# Patient Record
Sex: Male | Born: 1940
Health system: Southern US, Community
[De-identification: ages and names within clinical notes are randomized; demographics above are authoritative.]

## PROBLEM LIST (undated history)

## (undated) DIAGNOSIS — I10 Essential (primary) hypertension: Secondary | ICD-10-CM

## (undated) DIAGNOSIS — M21371 Foot drop, right foot: Secondary | ICD-10-CM

## (undated) DIAGNOSIS — I219 Acute myocardial infarction, unspecified: Secondary | ICD-10-CM

## (undated) DIAGNOSIS — Z8711 Personal history of peptic ulcer disease: Secondary | ICD-10-CM

## (undated) DIAGNOSIS — R001 Bradycardia, unspecified: Secondary | ICD-10-CM

## (undated) DIAGNOSIS — E782 Mixed hyperlipidemia: Secondary | ICD-10-CM

## (undated) DIAGNOSIS — Z8679 Personal history of other diseases of the circulatory system: Secondary | ICD-10-CM

## (undated) DIAGNOSIS — I2510 Atherosclerotic heart disease of native coronary artery without angina pectoris: Secondary | ICD-10-CM

## (undated) DIAGNOSIS — Z8719 Personal history of other diseases of the digestive system: Secondary | ICD-10-CM

## (undated) DIAGNOSIS — G709 Myoneural disorder, unspecified: Secondary | ICD-10-CM

## (undated) HISTORY — DX: Personal history of other diseases of the digestive system: Z87.19

## (undated) HISTORY — DX: Mixed hyperlipidemia: E78.2

## (undated) HISTORY — DX: Atherosclerotic heart disease of native coronary artery without angina pectoris: I25.10

## (undated) HISTORY — DX: Essential (primary) hypertension: I10

## (undated) HISTORY — DX: Personal history of peptic ulcer disease: Z87.11

## (undated) HISTORY — DX: Acute myocardial infarction, unspecified: I21.9

## (undated) HISTORY — DX: Bradycardia, unspecified: R00.1

## (undated) HISTORY — PX: CORONARY STENT PLACEMENT: SHX1402

## (undated) HISTORY — DX: Personal history of other diseases of the circulatory system: Z86.79

---

## 2002-05-09 DIAGNOSIS — I219 Acute myocardial infarction, unspecified: Secondary | ICD-10-CM

## 2002-05-09 HISTORY — DX: Acute myocardial infarction, unspecified: I21.9

## 2005-10-28 ENCOUNTER — Inpatient Hospital Stay (HOSPITAL_COMMUNITY): Admission: EM | Admit: 2005-10-28 | Discharge: 2005-10-30 | Payer: Self-pay | Admitting: Emergency Medicine

## 2005-10-28 ENCOUNTER — Ambulatory Visit: Payer: Self-pay | Admitting: Cardiology

## 2005-11-16 ENCOUNTER — Ambulatory Visit: Payer: Self-pay | Admitting: Cardiology

## 2005-12-14 ENCOUNTER — Ambulatory Visit: Payer: Self-pay | Admitting: Cardiology

## 2006-01-02 ENCOUNTER — Ambulatory Visit: Payer: Self-pay | Admitting: Cardiology

## 2006-02-08 ENCOUNTER — Ambulatory Visit: Payer: Self-pay | Admitting: Cardiology

## 2006-04-04 ENCOUNTER — Ambulatory Visit: Payer: Self-pay | Admitting: Cardiology

## 2006-04-21 ENCOUNTER — Ambulatory Visit: Payer: Self-pay | Admitting: Cardiology

## 2006-05-17 ENCOUNTER — Ambulatory Visit: Payer: Self-pay

## 2006-05-17 LAB — CONVERTED CEMR LAB
Albumin: 4.1 g/dL (ref 3.5–5.2)
Bilirubin, Direct: 0.2 mg/dL (ref 0.0–0.3)
CO2: 33 meq/L — ABNORMAL HIGH (ref 19–32)
Chloride: 102 meq/L (ref 96–112)
Creatinine, Ser: 1.3 mg/dL (ref 0.4–1.5)
Glucose, Bld: 89 mg/dL (ref 70–99)
HDL: 50 mg/dL (ref >39.0)
Total Bilirubin: 1.1 mg/dL (ref 0.3–1.2)
Total Protein: 7.1 g/dL (ref 6.0–8.3)
VLDL: 47 mg/dL — ABNORMAL HIGH (ref 0–40)

## 2006-05-18 ENCOUNTER — Encounter: Payer: Self-pay | Admitting: Cardiology

## 2006-05-18 LAB — CONVERTED CEMR LAB: Glucose, Bld: 93 mg/dL (ref 70–99)

## 2006-11-02 ENCOUNTER — Ambulatory Visit: Payer: Self-pay | Admitting: Cardiology

## 2006-11-02 LAB — CONVERTED CEMR LAB
AST: 20 units/L (ref 0–37)
Albumin: 4.1 g/dL (ref 3.5–5.2)
Alkaline Phosphatase: 101 units/L (ref 39–117)
Cholesterol: 222 mg/dL (ref 0–200)
HDL: 46.5 mg/dL (ref >39.0)
Total Bilirubin: 1.4 mg/dL — ABNORMAL HIGH (ref 0.3–1.2)
Total CHOL/HDL Ratio: 4.8
VLDL: 40 mg/dL (ref 0–40)

## 2007-05-14 ENCOUNTER — Ambulatory Visit: Payer: Self-pay | Admitting: Cardiology

## 2007-10-22 ENCOUNTER — Ambulatory Visit: Payer: Self-pay | Admitting: Cardiology

## 2007-10-22 LAB — CONVERTED CEMR LAB
ALT: 15 units/L (ref 0–53)
Albumin: 4 g/dL (ref 3.5–5.2)
Alkaline Phosphatase: 93 units/L (ref 39–117)
Direct LDL: 227.7 mg/dL
HDL: 37.6 mg/dL — ABNORMAL LOW (ref >39.0)
Total Bilirubin: 1.1 mg/dL (ref 0.3–1.2)
Total CHOL/HDL Ratio: 8.4
Total Protein: 7.1 g/dL (ref 6.0–8.3)

## 2007-11-19 ENCOUNTER — Ambulatory Visit: Payer: Self-pay | Admitting: Cardiology

## 2008-01-28 ENCOUNTER — Ambulatory Visit: Payer: Self-pay | Admitting: Cardiology

## 2008-01-28 LAB — CONVERTED CEMR LAB
AST: 19 units/L (ref 0–37)
Bilirubin, Direct: 0.1 mg/dL (ref 0.0–0.3)
Direct LDL: 142.8 mg/dL
Total Bilirubin: 1.1 mg/dL (ref 0.3–1.2)
Total CHOL/HDL Ratio: 4.9
Total Protein: 7.1 g/dL (ref 6.0–8.3)
Triglycerides: 157 mg/dL — ABNORMAL HIGH (ref 0–149)
VLDL: 31 mg/dL (ref 0–40)

## 2008-05-14 ENCOUNTER — Ambulatory Visit: Payer: Self-pay | Admitting: Cardiology

## 2008-05-20 ENCOUNTER — Ambulatory Visit: Payer: Self-pay | Admitting: Cardiology

## 2008-11-28 ENCOUNTER — Encounter (INDEPENDENT_AMBULATORY_CARE_PROVIDER_SITE_OTHER): Payer: Self-pay | Admitting: *Deleted

## 2009-01-03 DIAGNOSIS — E785 Hyperlipidemia, unspecified: Secondary | ICD-10-CM | POA: Insufficient documentation

## 2009-01-03 DIAGNOSIS — Z8711 Personal history of peptic ulcer disease: Secondary | ICD-10-CM | POA: Insufficient documentation

## 2009-01-03 DIAGNOSIS — I10 Essential (primary) hypertension: Secondary | ICD-10-CM | POA: Insufficient documentation

## 2009-01-03 HISTORY — DX: Personal history of peptic ulcer disease: Z87.11

## 2009-01-07 ENCOUNTER — Ambulatory Visit: Payer: Self-pay | Admitting: Cardiology

## 2009-01-07 DIAGNOSIS — I498 Other specified cardiac arrhythmias: Secondary | ICD-10-CM | POA: Insufficient documentation

## 2009-01-08 ENCOUNTER — Encounter: Payer: Self-pay | Admitting: Cardiology

## 2009-07-07 ENCOUNTER — Ambulatory Visit: Payer: Self-pay | Admitting: Cardiology

## 2009-07-27 ENCOUNTER — Ambulatory Visit (HOSPITAL_COMMUNITY): Admission: RE | Admit: 2009-07-27 | Discharge: 2009-07-27 | Payer: Self-pay | Admitting: Family Medicine

## 2009-07-27 ENCOUNTER — Encounter (HOSPITAL_COMMUNITY): Admission: RE | Admit: 2009-07-27 | Discharge: 2009-08-26 | Payer: Self-pay | Admitting: Neurology

## 2009-08-27 ENCOUNTER — Encounter (HOSPITAL_COMMUNITY): Admission: RE | Admit: 2009-08-27 | Discharge: 2009-09-26 | Payer: Self-pay | Admitting: Neurology

## 2009-12-03 ENCOUNTER — Telehealth: Payer: Self-pay | Admitting: Cardiology

## 2009-12-07 ENCOUNTER — Encounter: Payer: Self-pay | Admitting: Cardiology

## 2009-12-14 DIAGNOSIS — I2510 Atherosclerotic heart disease of native coronary artery without angina pectoris: Secondary | ICD-10-CM | POA: Insufficient documentation

## 2009-12-15 ENCOUNTER — Ambulatory Visit: Payer: Self-pay | Admitting: Cardiology

## 2009-12-25 ENCOUNTER — Ambulatory Visit: Payer: Self-pay | Admitting: Cardiology

## 2009-12-25 ENCOUNTER — Encounter: Payer: Self-pay | Admitting: Internal Medicine

## 2009-12-31 ENCOUNTER — Ambulatory Visit: Payer: Self-pay | Admitting: Cardiology

## 2010-06-08 NOTE — Procedures (Signed)
Summary: Holter and Event  Holter and Event   Imported By: Faythe Ghee 12/31/2009 16:09:05  _____________________________________________________________________  External Attachment:    Type:   Image     Comment:   External Document

## 2010-06-08 NOTE — Assessment & Plan Note (Signed)
Summary: [place]      Allergies Added:   Visit Type:  Follow-up Primary [name]:  [name]  CC:  no cardiology complaints.  History of Present Illness: [name] comes in today for followup of his symptomatic bradycardia.  We discontinued his low-dose metoprolol at 25 mg a day. He felt much better with no further dizziness. Holter monitor demonstrated a mean heart rate of 62 beats per minute. Max was 96 beats per minute and his minimal was 46 beats per minute. He had rare ventricular ectopy with one couplet and rare supraventricular aches as well. He has a history of relatively good LV systolic function.  He denies a palpitations or rapid heartbeat. He's had no angina or ischemic symptoms. His blood pressure remains under good control.  Current Medications (verified): 1)  Aspirin 81 Mg Tbec (Aspirin) .... Take One Tablet By Mouth Daily 2)  Simvastatin 80 Mg Tabs (Simvastatin) .[name].. 1 Tab At Bedtime 3)  Enalapril Maleate 10 Mg Tabs (Enalapril Maleate) .[name].. 1 Tab Once Daily 4)  Omeprazole 20 Mg Tbec (Omeprazole) .[name].. 1 Tab Once Daily 5)  Multivitamins   Tabs (Multiple Vitamin) .[name].. 1 Tab Once Daily 6)  Nitroglycerin 0.4 Mg Subl (Nitroglycerin) .... One Tablet Under Tongue Every 5 Minutes As Needed For Chest Pain---May Repeat Times Three  Allergies (verified): 1)  ! Ibuprofen  Past History:  Past Medical History: Last updated: 12/14/2009 CAD, NATIVE VESSEL (ICD-414.01) BRADYCARDIA (ICD-427.89) MI/ 2004 (ICD-410.90) MITRAL VALVE PROLAPSE, HX OF/ MILD (ICD-V12.50) HYPERLIPIDEMIA-MIXED (ICD-272.4) HYPERTENSION, UNSPECIFIED (ICD-401.9) GI BLEEDING/ HX OF 2004 (ICD-578.9) PUD, HX OF (ICD-V12.71)    Past Surgical History: Last updated: 01/03/2009 5 stents in 2004 1 stents in 2007  Family History: Last updated: 01/03/2009 Family History of Coronary Artery Disease: Mother and father both deceased due to MI Siblings: 1 Sister and 1  Brother  Social History: Last updated: 01/03/2009 Retired ..2006 Disabled  Married  Tobacco Use - Former. quit 1978 Alcohol Use - no Regular Exercise - yes Drug Use - no  Risk Factors: Exercise: yes (01/03/2009)  Risk Factors: Smoking Status: quit (01/03/2009)  Review of Systems       negative other than history of present illness  Vital Signs:  Patient profile:   70 year old male Weight:      198 pounds BMI:     25.51 Pulse rate:   87 / minute BP sitting:   111 / 67  (right arm)  Vitals Entered By: Dreama Saa, CNA (December 31, 2009 11:23 AM)  Physical Exam  General:  Well developed, well nourished, in no acute distress. Head:  normocephalic and atraumatic Eyes:  PERRLA/EOM intact; conjunctiva and lids normal. Neck:  Neck supple, no JVD. No masses, thyromegaly or abnormal cervical nodes. Chest Wall:  no deformities or breast masses noted Lungs:  Clear bilaterally to auscultation and percussion. Heart:  PMI not displaced, normal S1-S2, regular rate and rhythm, carotid strokes are equal bilaterally without bruit Msk:  Back normal, normal gait. Muscle strength and tone normal. Pulses:  pulses normal in all 4 extremities Extremities:  No clubbing or cyanosis. Neurologic:  Alert and oriented x 3. Skin:  Intact without lesions or rashes. Psych:  Normal affect.   Impression & Recommendations:  Problem # 1:  CAD, NATIVE VESSEL (ICD-414.01) Assessment Unchanged  His updated medication list for this problem includes:    Aspirin 81 Mg Tbec (Aspirin) .[name]... Take one tablet by mouth daily    Enalapril Maleate 10 Mg Tabs (  Enalapril maleate) .[name]... 1 tab once daily    Nitroglycerin 0.4 Mg Subl (Nitroglycerin) ..... One tablet under tongue every 5 minutes as needed for chest pain---may repeat times three  Problem # 2:  BRADYCARDIA (ICD-427.89) Assessment: Improved his resting heart rate is now in the 70s. Holter monitor showed minimal heart rate of 40 60 in the morning  during sleep. He has minimal ectopy. He has normal left ventricular function. At the present time there is no indication for pacemaker and will self a low dose beta blocker. Results reviewed at length and all questions answered. I will see him back again in 6 months. His updated medication list for this problem includes:    Aspirin 81 Mg Tbec (Aspirin) .[name]... Take one tablet by mouth daily    Enalapril Maleate 10 Mg Tabs (Enalapril maleate) .[name]... 1 tab once daily    Nitroglycerin 0.4 Mg Subl (Nitroglycerin) ..... One tablet under tongue every 5 minutes as needed for chest pain---may repeat times three  Problem # 3:  MI/ 2004 (ICD-410.90) Assessment: Unchanged  His updated medication list for this problem includes:    Aspirin 81 Mg Tbec (Aspirin) .[name]... Take one tablet by mouth daily    Enalapril Maleate 10 Mg Tabs (Enalapril maleate) .[name]... 1 tab once daily    Nitroglycerin 0.4 Mg Subl (Nitroglycerin) ..... One tablet under tongue every 5 minutes as needed for chest pain---may repeat times three  Problem # 4:  MITRAL VALVE PROLAPSE, HX OF/ MILD (ICD-V12.50) Assessment: Unchanged  Problem # 5:  HYPERLIPIDEMIA-MIXED (ICD-272.4)  His updated medication list for this problem includes:    Simvastatin 80 Mg Tabs (Simvastatin) .[name]... 1 tab at bedtime  Problem # 6:  HYPERTENSION, UNSPECIFIED (ICD-401.9) Assessment: Unchanged  His updated medication list for this problem includes:    Aspirin 81 Mg Tbec (Aspirin) .[name]... Take one tablet by mouth daily    Enalapril Maleate 10 Mg Tabs (Enalapril maleate) .[name]... 1 tab once daily  Patient Instructions: 1)  Your physician recommends that you schedule a follow-up appointment in: 6 months 2)  Your physician recommends that you continue on your current medications as directed. Please refer to the Current Medication list given to you today.

## 2010-06-08 NOTE — Assessment & Plan Note (Signed)
Summary: 6 mo f/u cy  Medications Added SIMVASTATIN 80 MG TABS (SIMVASTATIN) 1 tab at bedtime METOPROLOL SUCCINATE 25 MG XR24H-TAB (METOPROLOL SUCCINATE) 1 tab once daily ENALAPRIL MALEATE 10 MG TABS (ENALAPRIL MALEATE) 1 tab once daily NITROGLYCERIN 0.4 MG SUBL (NITROGLYCERIN) One tablet under tongue every 5 minutes as needed for chest pain---may repeat times three        Visit Type:  6 mo f/u Primary Provider:  no PCP  CC:  sob...edema/right foot.Marland Kitchendenies any cp...  History of Present Illness: [name] returns today for evaluation and management his coronary artery disease and mixed hyperlipidemia.  He is no complaints of angina or ischemic symptoms. He's having no problems with his medications and seems to be compliant.  As orthopnea, PND peripheral edema palpitations or syncope.  Recent lipids show total cholesterol 174, HDL 44, LDL 107, VLDL 23, and triglycerides normal at 114. LFTs were normal I reviewed these with patient and his wife today.  Current Medications (verified): 1)  Aspirin 81 Mg Tbec (Aspirin) .... Take One Tablet By Mouth Daily 2)  Simvastatin 80 Mg Tabs (Simvastatin) .[name].. 1 Tab At Bedtime 3)  Metoprolol Succinate 25 Mg Xr24h-Tab (Metoprolol Succinate) .[name].. 1 Tab Once Daily 4)  Enalapril Maleate 10 Mg Tabs (Enalapril Maleate) .[name].. 1 Tab Once Daily 5)  Omeprazole 20 Mg Tbec (Omeprazole) .[name].. 1 Tab Once Daily 6)  Multivitamins   Tabs (Multiple Vitamin) .[name].. 1 Tab Once Daily 7)  Nitroglycerin 0.4 Mg Subl (Nitroglycerin) .... One Tablet Under Tongue Every 5 Minutes As Needed For Chest Pain---May Repeat Times Three  Allergies: 1)  ! Ibuprofen  Past History:  Past Medical History: Last updated: 01/03/2009 MI/ 2004 (ICD-410.90) CAD, UNSPECIFIED SITE (ICD-414.00) MITRAL VALVE PROLAPSE, HX OF/ MILD (ICD-V12.50) HYPERLIPIDEMIA-MIXED (ICD-272.4) HYPERTENSION, UNSPECIFIED (ICD-401.9) GI BLEEDING/ HX OF 2004 (ICD-578.9) PUD, HX OF (ICD-V12.71)    Past  Surgical History: Last updated: 01/03/2009 5 stents in 2004 1 stents in 2007  Family History: Last updated: 01/03/2009 Family History of Coronary Artery Disease: Mother and father both deceased due to MI Siblings: 1 Sister and 1 Brother  Social History: Last updated: 01/03/2009 Retired ..2006 Disabled  Married  Tobacco Use - Former. quit 1978 Alcohol Use - no Regular Exercise - yes Drug Use - no  Risk Factors: Exercise: yes (01/03/2009)  Risk Factors: Smoking Status: quit (01/03/2009)  Review of Systems       negative other than history of present illness  Vital Signs:  Patient profile:   70 year old male Height:      74 inches Weight:      203 pounds BMI:     26.16 Pulse rate:   52 / minute Pulse rhythm:   regular BP sitting:   100 / 60  (left arm) Cuff size:   large  Vitals Entered By: Danielle Rankin, CMA (July 07, 2009 8:55 AM)  Physical Exam  General:  disheveled Head:  normocephalic and atraumatic Eyes:  PERRLA/EOM intact; conjunctiva and lids normal. Neck:  Neck supple, no JVD. No masses, thyromegaly or abnormal cervical nodes. Chest [name]:  no deformities or breast masses noted Lungs:  Clear bilaterally to auscultation and percussion. Heart:  Non-displaced PMI, chest non-tender; regular rate and rhythm, S1, S2 without murmurs, rubs or gallops. Carotid upstroke normal, no bruit. Normal abdominal aortic size, no bruits. Femorals normal pulses, no bruits. Pedals normal pulses. No edema, no varicosities. Abdomen:  Bowel sounds positive; abdomen soft and non-tender without masses, organomegaly, or hernias noted. No  hepatosplenomegaly. Msk:  Back normal, normal gait. Muscle strength and tone normal. Pulses:  pulses normal in all 4 extremities Extremities:  No clubbing or cyanosis. Neurologic:  Alert and oriented x 3. Skin:  Intact without lesions or rashes. Psych:  Normal affect.   Impression & Recommendations:  Problem # 1:  CAD, UNSPECIFIED SITE  (ICD-414.00) Assessment Unchanged  His updated medication list for this problem includes:    Aspirin 81 Mg Tbec (Aspirin) .[name]... Take one tablet by mouth daily    Metoprolol Succinate 25 Mg Xr24h-tab (Metoprolol succinate) .[name]... 1 tab once daily    Enalapril Maleate 10 Mg Tabs (Enalapril maleate) .[name]... 1 tab once daily    Nitroglycerin 0.4 Mg Subl (Nitroglycerin) ..... One tablet under tongue every 5 minutes as needed for chest pain---may repeat times three  Problem # 2:  MI/ 2004 (ICD-410.90) Assessment: Unchanged  His updated medication list for this problem includes:    Aspirin 81 Mg Tbec (Aspirin) .[name]... Take one tablet by mouth daily    Metoprolol Succinate 25 Mg Xr24h-tab (Metoprolol succinate) .[name]... 1 tab once daily    Enalapril Maleate 10 Mg Tabs (Enalapril maleate) .[name]... 1 tab once daily    Nitroglycerin 0.4 Mg Subl (Nitroglycerin) ..... One tablet under tongue every 5 minutes as needed for chest pain---may repeat times three  Problem # 3:  HYPERLIPIDEMIA-MIXED (ICD-272.4) Assessment: Unchanged His numbers are not at goal. He is currently on maximum simvastatin. No further changes at this time. Encouraged to take his medicines daily His updated medication list for this problem includes:    Simvastatin 80 Mg Tabs (Simvastatin) .[name]... 1 tab at bedtime  Problem # 4:  HYPERTENSION, UNSPECIFIED (ICD-401.9) Assessment: Improved  His updated medication list for this problem includes:    Aspirin 81 Mg Tbec (Aspirin) .[name]... Take one tablet by mouth daily    Metoprolol Succinate 25 Mg Xr24h-tab (Metoprolol succinate) .[name]... 1 tab once daily    Enalapril Maleate 10 Mg Tabs (Enalapril maleate) .[name]... 1 tab once daily  Patient Instructions: 1)  Your physician recommends that you schedule a follow-up appointment in: 6 MONTHS WITH DR Dyamond Tolosa  2)  INFORMED PT THE NEED TO GET PMD Prescriptions: NITROGLYCERIN 0.4 MG SUBL (NITROGLYCERIN) One tablet under tongue every 5 minutes as needed for chest  pain---may repeat times three  #90 x 3   Entered by:   Danielle Rankin, CMA   Authorized by:   Gaylord Shih, MD, [facility]   Signed by:   Danielle Rankin, CMA on 07/07/2009   Method used:   Electronically to        [facility]* (mail-order)             ,          Ph: [phone]       Fax: [phone]   RxID:   [id number] SIMVASTATIN 80 MG TABS (SIMVASTATIN) 1 tab at bedtime  #90 x 3   Entered by:   Danielle Rankin, CMA   Authorized by:   Gaylord Shih, MD, [facility]   Signed by:   Danielle Rankin, CMA on 07/07/2009   Method used:   Electronically to        [facility]* (mail-order)             ,          Ph: [phone]       Fax: [phone]   RxID:   [id number] ENALAPRIL MALEATE 10 MG TABS (ENALAPRIL MALEATE) 1 tab once  daily  #90 x 3   Entered by:   Danielle Rankin, CMA   Authorized by:   Gaylord Shih, MD, [facility]   Signed by:   Danielle Rankin, CMA on 07/07/2009   Method used:   Electronically to        [facility] [facility]* (mail-order)             ,          Ph: [phone]       Fax: [phone]   RxID:   [id number] METOPROLOL SUCCINATE 25 MG XR24H-TAB (METOPROLOL SUCCINATE) 1 tab once daily  #90 x 3   Entered by:   Danielle Rankin, CMA   Authorized by:   Gaylord Shih, MD, [facility]   Signed by:   Danielle Rankin, CMA on 07/07/2009   Method used:   Electronically to        [facility] [employer] [facility]* (mail-order)             ,          Ph: [phone]       Fax: [phone]   RxID:   [id number]

## 2010-06-08 NOTE — Progress Notes (Signed)
Summary: REFILL   Phone Note Refill Request   Refills Requested: Medication #1:  ENALAPRIL MALEATE 10 MG TABS 1 tab once daily [facility] [phone]     Prescriptions: ENALAPRIL MALEATE 10 MG TABS (ENALAPRIL MALEATE) 1 tab once daily  #90 x 3   Entered by:   Bishop Dublin, CMA   Authorized by:   Gaylord Shih, MD, [facility]   Signed by:   Bishop Dublin, CMA on 12/03/2009   Method used:   Electronically to        [facility]* (retail)             ,          Ph: [phone]       Fax: [phone]   RxID:   [id number]

## 2010-06-08 NOTE — Procedures (Signed)
Summary: Summary Report  Summary Report   Imported By: Erle Crocker 01/29/2010 11:50:29  _____________________________________________________________________  External Attachment:    Type:   Image     Comment:   External Document

## 2010-06-08 NOTE — Assessment & Plan Note (Signed)
Summary: rov/hypotensive/bradycardia/jml    Visit Type:  rov Primary Provider:  [name]  CC:  pt here today for bradycardia as per PCP...pt states he has felt "swimmy headed"....edema/feet at times...otherwise denies any cp or sob.  History of Present Illness: [name] comes in today for symptomatic bradycardia. He's always run a low heart rate but now he is dizzy with lying to standing. He was seen in Dr. Kathi Der office and was found to have a heart rate into the 50s. EKG at that time confirmed sinus bradycardia at a rate of 42 beats per minute. There were no acute EKG changes.  He denies any symptoms of angina or ischemia. Studies were thought to, edema, or PND. He denies any palpitations.  Stress Myoview as listed below was stable last year.  Current Medications (verified): 1)  Aspirin 81 Mg Tbec (Aspirin) .... Take One Tablet By Mouth Daily 2)  Simvastatin 80 Mg Tabs (Simvastatin) .[name].. 1 Tab At Bedtime 3)  Metoprolol Succinate 25 Mg Xr24h-Tab (Metoprolol Succinate) .[name].. 1 Tab Once Daily 4)  Enalapril Maleate 10 Mg Tabs (Enalapril Maleate) .[name].. 1 Tab Once Daily 5)  Omeprazole 20 Mg Tbec (Omeprazole) .[name].. 1 Tab Once Daily 6)  Multivitamins   Tabs (Multiple Vitamin) .[name].. 1 Tab Once Daily 7)  Nitroglycerin 0.4 Mg Subl (Nitroglycerin) .... One Tablet Under Tongue Every 5 Minutes As Needed For Chest Pain---May Repeat Times Three  Allergies: 1)  ! Ibuprofen  Past History:  Past Medical History: Last updated: 12/14/2009 CAD, NATIVE VESSEL (ICD-414.01) BRADYCARDIA (ICD-427.89) MI/ 2004 (ICD-410.90) MITRAL VALVE PROLAPSE, HX OF/ MILD (ICD-V12.50) HYPERLIPIDEMIA-MIXED (ICD-272.4) HYPERTENSION, UNSPECIFIED (ICD-401.9) GI BLEEDING/ HX OF 2004 (ICD-578.9) PUD, HX OF (ICD-V12.71)    Past Surgical History: Last updated: 01/03/2009 5 stents in 2004 1 stents in 2007  Family History: Last updated: 01/03/2009 Family History of Coronary Artery Disease:  Mother and father both deceased due to MI Siblings: 1 Sister and 1 Brother  Social History: Last updated: 01/03/2009 Retired ..2006 Disabled  Married  Tobacco Use - Former. quit 1978 Alcohol Use - no Regular Exercise - yes Drug Use - no  Risk Factors: Exercise: yes (01/03/2009)  Risk Factors: Smoking Status: quit (01/03/2009)  Review of Systems       negative other than history of present illness. He denies a bleeding, nausea vomiting or diarrhea.  Vital Signs:  Patient profile:   70 year old male Height:      74 inches Weight:      200 pounds BMI:     25.77 Pulse rate:   43 / minute Pulse (ortho):   47 / minute Pulse rhythm:   irregular BP supine:   98 / 67  (left arm) BP standing:   104 / 76 Cuff size:   large  Vitals Entered By: Danielle Rankin, CMA (December 15, 2009 12:09 PM)  Serial Vital Signs/Assessments:  Time      Position  BP       Pulse  Resp  Temp     By 12:16 PM  Lying LA  98/67    43                    Danielle Rankin, CMA 12:17 PM  Sitting   109/70   43                    Danielle Rankin, CMA 12:18 PM  Standing  104/76   47  Danielle Rankin, CMA 12:20 PM  Standing  110/76   [address], [place] 12:22 PM  Standing  109/75   48                    Danielle Rankin, [place]  Comments: 12:16 PM swimmy headeded By: Danielle Rankin, CMA  12:17 PM dizzy By: Danielle Rankin, CMA  12:18 PM no sxms By: Danielle Rankin, CMA  12:20 PM no sxms By: Danielle Rankin, CMA  12:22 PM no sxms By: Danielle Rankin, CMA     EKG  Procedure date:  12/15/2009  Findings:      marked sinus bradycardia rate of 43 beats per minute, first-degree AV block, no acute changes.  Impression & Recommendations:  Problem # 1:  CAD, NATIVE VESSEL (ICD-414.01) He is having no symptoms of ischemia. Stress Myoview last she was stable. The following medications were removed from the medication list:    Metoprolol Succinate 25 Mg Xr24h-tab (Metoprolol succinate) .[name]... 1 tab  once daily His updated medication list for this problem includes:    Aspirin 81 Mg Tbec (Aspirin) .[name]... Take one tablet by mouth daily    Enalapril Maleate 10 Mg Tabs (Enalapril maleate) .[name]... 1 tab once daily    Nitroglycerin 0.4 Mg Subl (Nitroglycerin) ..... One tablet under tongue every 5 minutes as needed for chest pain---may repeat times three  Orders: EKG w/ Interpretation (93000) Holter Monitor (Holter Monitor)  Problem # 2:  BRADYCARDIA (ICD-427.89) Assessment: Deteriorated His rate is low and does not change with standing up. His blood pressure maintained but is also low. Since he is now symptomatic, we'll discontinue his metoprolol. He is only on 25 mg a day. In a week, we'll obtain a 24-hour Holter monitor to see what his heart rate response is to daily activities. I also advised him to push fluids particular water. His updated medication list for this problem includes:    Aspirin 81 Mg Tbec (Aspirin) .[name]... Take one tablet by mouth daily    Metoprolol Succinate 25 Mg Xr24h-tab (Metoprolol succinate) .[name]... 1 tab once daily    Enalapril Maleate 10 Mg Tabs (Enalapril maleate) .[name]... 1 tab once daily    Nitroglycerin 0.4 Mg Subl (Nitroglycerin) ..... One tablet under tongue every 5 minutes as needed for chest pain---may repeat times three  Orders: EKG w/ Interpretation (93000) Holter Monitor (Holter Monitor)  Problem # 3:  MI/ 2004 (ICD-410.90) Assessment: Unchanged  The following medications were removed from the medication list:    Metoprolol Succinate 25 Mg Xr24h-tab (Metoprolol succinate) .[name]... 1 tab once daily His updated medication list for this problem includes:    Aspirin 81 Mg Tbec (Aspirin) .[name]... Take one tablet by mouth daily    Enalapril Maleate 10 Mg Tabs (Enalapril maleate) .[name]... 1 tab once daily    Nitroglycerin 0.4 Mg Subl (Nitroglycerin) ..... One tablet under tongue every 5 minutes as needed for chest pain---may repeat times three  Problem # 4:  MITRAL VALVE  PROLAPSE, HX OF/ MILD (ICD-V12.50) Assessment: Unchanged  Problem # 5:  HYPERLIPIDEMIA-MIXED (ICD-272.4)  His updated medication list for this problem includes:    Simvastatin 80 Mg Tabs (Simvastatin) .[name]... 1 tab at bedtime  Problem # 6:  HYPERTENSION, UNSPECIFIED (ICD-401.9)  The following medications were removed from the medication list:    Metoprolol Succinate 25 Mg Xr24h-tab (Metoprolol succinate) .[name]... 1 tab once daily  His updated medication list for this problem includes:    Aspirin 81 Mg Tbec (Aspirin) .[name]... Take one tablet by mouth daily    Enalapril Maleate 10 Mg Tabs (Enalapril maleate) .[name]... 1 tab once daily  Clinical Reports Reviewed:  Cardiac Cath:  10/28/2005: Cardiac Cath Findings:   ASSESSMENT:  1.  Three vessel native coronary artery disease.  2.  The LAD has diffuse ectasia and swelling flow proximally as described      above.  There is a borderline lesion in the distal LAD and a high grade      disease in the diagonal, but these do not appear to be the culprit      lesion.  3.  Totally occluded OM1 which appears to be the culprit.  4.  Patent right coronary stents.  5.  Normal LV function with mild mitral valve prolapse but no significant      mitral regurgitation.   PLAN/DISCUSSION:  I reviewed the films with Dr. Samule Ohm and Dr. Juanda Chance. We  will plan for percutaneous intervention on the OM1 today.  He will need  aggressive risk factor modification.   Arvilla Meres, M.D. [facility]  Electronically Signed Cardiac Cath Findings:   CONCLUSION:  Successful PCI of a totally occluded circumflex marginal vessel  with improvement in narrowing from 100% to 0% and improvement in flow from  TIMI 0 to TIMI III flow using a bare metal stent.   DISPOSITION:  The patient was returned to the recovery room for further  observation.         ______________________________  Charlies Constable, M.D. [facility]  Nuclear Study:  05/14/2008:  Excerise capacity: Good exercise  capacity  Blood Pressure response: Normal blood pressure response  Clinical symptoms: No chest pain or dyspnea  ECG impression: No significant ST sgement change suggestive of ischemia  Overall impression: Normal stress nulcear study.  Noralyn Pick. Eden Emms, MD   05/17/2006:  Excerise capacity: Good exercise capacity  Blood Pressure response: normal blood pressure response  Clinical symptoms: No chest mpain or dyspnea  ECG impression: Insignificant upsloping ST segment depression  Overall impression: Low risk study. Minimal reversible defect in basilar inferior Nelani Schmelzle c/w ischemia.  Arvilla Meres, MD   Patient Instructions: 1)  Your physician recommends that you schedule a follow-up appointment in: 2 weeks with Dr. Daleen Squibb  Here or [place] 2)  Your physician has recommended you make the following change in your medication: STOP metoprolol 3)  continue to drink 4-6 glasses of water per day

## 2010-07-21 ENCOUNTER — Encounter: Payer: Self-pay | Admitting: Cardiology

## 2010-07-21 ENCOUNTER — Ambulatory Visit (INDEPENDENT_AMBULATORY_CARE_PROVIDER_SITE_OTHER): Payer: BC Managed Care – PPO | Admitting: Cardiology

## 2010-07-21 DIAGNOSIS — I2510 Atherosclerotic heart disease of native coronary artery without angina pectoris: Secondary | ICD-10-CM

## 2010-07-21 DIAGNOSIS — E785 Hyperlipidemia, unspecified: Secondary | ICD-10-CM

## 2010-07-22 ENCOUNTER — Encounter: Payer: Self-pay | Admitting: Cardiology

## 2010-07-27 NOTE — Assessment & Plan Note (Signed)
Summary: F6M per ckout 12/31/09 tmj/ths  Medications Added SIMVASTATIN 40 MG TABS (SIMVASTATIN) take 1 tablet by mouth at bedtime      Allergies Added:   Visit Type:  6 month follow up Primary Provider:  [name]   History of Present Illness: Mr Spivack returns for the E and M of his CAD, hx of bradycardia and hyperlipidemia. He remains very active and denies any chest discomfort or angina, palpitations, or presyncope. I note that he is on 80mg  of Simvastatin. His bloodwork is monitored by primary care. He denies any myalgias.  EKG with SB and no changes from last tracing.  Clinical Reports Reviewed:  Cardiac Cath:  10/28/2005: Cardiac Cath Findings:   ASSESSMENT:  1.  Three vessel native coronary artery disease.  2.  The LAD has diffuse ectasia and swelling flow proximally as described      above.  There is a borderline lesion in the distal LAD and a high grade      disease in the diagonal, but these do not appear to be the culprit      lesion.  3.  Totally occluded OM1 which appears to be the culprit.  4.  Patent right coronary stents.  5.  Normal LV function with mild mitral valve prolapse but no significant      mitral regurgitation.   PLAN/DISCUSSION:  I reviewed the films with Dr. Samule Ohm and Dr. Juanda Chance. We  will plan for percutaneous intervention on the OM1 today.  He will need  aggressive risk factor modification.   Arvilla Meres, M.D. [facility]  Electronically Signed Cardiac Cath Findings:   CONCLUSION:  Successful PCI of a totally occluded circumflex marginal vessel  with improvement in narrowing from 100% to 0% and improvement in flow from  TIMI 0 to TIMI III flow using a bare metal stent.   DISPOSITION:  The patient was returned to the recovery room for further  observation.         ______________________________  Charlies Constable, M.D. [facility]  Nuclear Study:  05/14/2008:  Excerise capacity: Good exercise capacity  Blood Pressure  response: Normal blood pressure response  Clinical symptoms: No chest pain or dyspnea  ECG impression: No significant ST sgement change suggestive of ischemia  Overall impression: Normal stress nulcear study.  Noralyn Pick. Eden Emms, MD   05/17/2006:  Excerise capacity: Good exercise capacity  Blood Pressure response: normal blood pressure response  Clinical symptoms: No chest mpain or dyspnea  ECG impression: Insignificant upsloping ST segment depression  Overall impression: Low risk study. Minimal reversible defect in basilar inferior Jamel Holzmann c/w ischemia.  Arvilla Meres, MD   Current Medications (verified): 1)  Aspirin 81 Mg Tbec (Aspirin) .... Take One Tablet By Mouth Daily 2)  Simvastatin 40 Mg Tabs (Simvastatin) .... Take 1 Tablet By Mouth At Bedtime 3)  Enalapril Maleate 10 Mg Tabs (Enalapril Maleate) .[name].. 1 Tab Once Daily 4)  Omeprazole 20 Mg Tbec (Omeprazole) .[name].. 1 Tab Once Daily 5)  Multivitamins   Tabs (Multiple Vitamin) .[name].. 1 Tab Once Daily 6)  Nitroglycerin 0.4 Mg Subl (Nitroglycerin) .... One Tablet Under Tongue Every 5 Minutes As Needed For Chest Pain---May Repeat Times Three  Allergies (verified): 1)  ! Ibuprofen  Past History:  Past Medical History: Last updated: 12/14/2009 CAD, NATIVE VESSEL (ICD-414.01) BRADYCARDIA (ICD-427.89) MI/ 2004 (ICD-410.90) MITRAL VALVE PROLAPSE, HX OF/ MILD (ICD-V12.50) HYPERLIPIDEMIA-MIXED (ICD-272.4) HYPERTENSION, UNSPECIFIED (ICD-401.9) GI BLEEDING/ HX OF 2004 (ICD-578.9) PUD, HX OF (ICD-V12.71)    Past Surgical History:  Last updated: 01/03/2009 5 stents in 2004 1 stents in 2007  Family History: Last updated: 01/03/2009 Family History of Coronary Artery Disease: Mother and father both deceased due to MI Siblings: 1 Sister and 1 Brother  Social History: Last updated: 01/03/2009 Retired ..2006 Disabled  Married  Tobacco Use - Former. quit 1978 Alcohol Use - no Regular Exercise - yes Drug Use - no  Risk  Factors: Exercise: yes (01/03/2009)  Risk Factors: Smoking Status: quit (01/03/2009)  Review of Systems       Negative other than HPI  Vital Signs:  Patient profile:   70 year old male Height:      74 inches Weight:      201 pounds BMI:     25.90 O2 Sat:      96 % on Room air Pulse rate:   71 / minute BP sitting:   96 / 66  (left arm)  Vitals Entered By: Teressa Lower RN (July 21, 2010 3:11 PM)  O2 Flow:  Room air  Physical Exam  General:  Well developed, well nourished, in no acute distress. Head:  normocephalic and atraumatic Eyes:  PERRLA/EOM intact; conjunctiva and lids normal. Neck:  Neck supple, no JVD. No masses, thyromegaly or abnormal cervical nodes. Chest Latika Kronick:  no deformities or breast masses noted Lungs:  Clear bilaterally to auscultation and percussion. Heart:  PMI nondisplaced, RRR, Nl S1 and S2, no murmur or bruit. Abdomen:  Bowel sounds positive; abdomen soft and non-tender without masses, organomegaly, or hernias noted. No hepatosplenomegaly. Msk:  Back normal, normal gait. Muscle strength and tone normal. Pulses:  pulses normal in all 4 extremities Extremities:  No clubbing or cyanosis. Neurologic:  Alert and oriented x 3. Skin:  Intact without lesions or rashes. Psych:  Normal affect.   Impression & Recommendations:  Problem # 1:  CAD, NATIVE VESSEL (ICD-414.01) Assessment Unchanged  His updated medication list for this problem includes:    Aspirin 81 Mg Tbec (Aspirin) .[name]... Take one tablet by mouth daily    Enalapril Maleate 10 Mg Tabs (Enalapril maleate) .[name]... 1 tab once daily    Nitroglycerin 0.4 Mg Subl (Nitroglycerin) ..... One tablet under tongue every 5 minutes as needed for chest pain---may repeat times three  Problem # 2:  BRADYCARDIA (ICD-427.89) Assessment: Improved  His updated medication list for this problem includes:    Aspirin 81 Mg Tbec (Aspirin) .[name]... Take one tablet by mouth daily    Enalapril Maleate 10 Mg Tabs  (Enalapril maleate) .[name]... 1 tab once daily    Nitroglycerin 0.4 Mg Subl (Nitroglycerin) ..... One tablet under tongue every 5 minutes as needed for chest pain---may repeat times three  Problem # 3:  MI/ 2004 (ICD-410.90) Assessment: Unchanged  His updated medication list for this problem includes:    Aspirin 81 Mg Tbec (Aspirin) .[name]... Take one tablet by mouth daily    Enalapril Maleate 10 Mg Tabs (Enalapril maleate) .[name]... 1 tab once daily    Nitroglycerin 0.4 Mg Subl (Nitroglycerin) ..... One tablet under tongue every 5 minutes as needed for chest pain---may repeat times three  Problem # 4:  HYPERLIPIDEMIA-MIXED (ICD-272.4) I have advised him to decrease his simvastatin to 40mg  q day per FDA guidelines. Followup bloodwork in 3 mos. His updated medication list for this problem includes:    Simvastatin 40 Mg Tabs (Simvastatin) .[name]... Take 1 tablet by mouth at bedtime  Future Orders: T-Lipid Profile ([id number]) ... 10/11/2010 T-Hepatic Function [phone]) ... 10/11/2010  Patient Instructions: 1)  Your physician recommends that you schedule a follow-up appointment in: 1 year 2)  Your physician recommends that you return for lab work in: 3 months, we will mail lab orders to you 3)  Your physician has recommended you make the following change in your medication: Decrease Simvastatin to 40mg  by mouth at bedtime  Prescriptions: SIMVASTATIN 40 MG TABS (SIMVASTATIN) take 1 tablet by mouth at bedtime  #90 x 1   Entered by:   Larita Fife Via LPN   Authorized by:   Gaylord Shih, MD, [facility]   Signed by:   Larita Fife Via LPN on 30/86/5784   Method used:   Electronically to        [facility]* (retail)             ,          Ph: [phone]       Fax: [phone]   RxID:   [id number]

## 2010-07-27 NOTE — Letter (Signed)
Summary: [place] [facility] at [employer]  [address] [place] [address]   Phone: [phone]  Fax: [phone]     July 21, 2010 MRN: [id number]   KRISTA SOM [address] [place], [place]  [address]      YOUR LAB WORK IS DUE  October 11, 2010 _________________________________________  Please go to [facility], located across the street from [facility] on the second floor.  Hours are Monday - Friday 7am until 7:30pm         Saturday 8am until 12noon    _X_  DO NOT EAT OR DRINK AFTER MIDNIGHT EVENING PRIOR TO LABWORK  __ YOUR LABWORK IS NOT FASTING --YOU MAY EAT PRIOR TO LABWORK

## 2010-09-21 NOTE — Assessment & Plan Note (Signed)
[facility]                            CARDIOLOGY OFFICE NOTE   NAME:[name], [name]                          MRN:          [id number]  DATE:11/02/2006                            DOB:          10/19/1940    Mr. Muse returns today for followup of the following issues:  1. Coronary artery disease.      a.     Out of hospital posterior lateral wall infarct. He is status       post express stent to the obtuse marginal of the circumflex. He       has overall normal left ventricular systolic function. His       previous history included stents to the right coronary artery x5,       all patent at that catheterization. He had 70% and an ectatic LAD       and a 90% in the diagonal 2, 70-80% in the distal LAD. He also had       mitral valve prolapse on his left ventriculogram. He is having no       angina.  2. Hypertension.  3. Hyperlipidemia.  4. History of a GI bleed in 2004 on Plavix, aspirin and ibuprofen.  5. History of peptic ulcer disease.   On last visit, we [facility] and  just kept him on Simvastatin 80 mg. Unfortunately he has not come back  for blood work. He is compliant however with his regimen.   CURRENT MEDICATIONS:  1. Simvastatin 80 mg a day.  2. Niacin 1000 mg a day.  3. Toprol 25 mg a day.  4. Enalapril 10 mg a day.  5. Vitamin once a day.   PHYSICAL EXAMINATION:  VITAL SIGNS:  His blood pressure today is 109/73,  his pulse is 48 and regular. He is always slow. His EKG is unchanged  with a first degree AV block of 212. His weight is 197, up 7.  HEENT:  Unchanged.  NECK:  Carotid upstrokes equal bilaterally without bruits. Thyroid is  not enlarged, trachea is midline.  LUNGS:  Clear.  HEART:  Reveals a nondisplaced PMI. He has a normal S1, S2 with a slow  rate.  ABDOMEN:  Soft with good bowel sounds, no midline bruit.  EXTREMITIES:  Reveal no edema. Pulses are intact.  NEUROLOGIC:  Intact.   Mr. Staheli is doing well. I have asked him to continue with his current  medications. We sent him to the lab for LFTs and lipid panel. He is  eating but I am most concerned about his LDL and total cholesterol.   I will plan on seeing him back in January 2009.     Thomas C. Daleen Squibb, MD, [facility]  Electronically Signed    TCW/MedQ  DD: 11/02/2006  DT: 11/02/2006  Job #: [id number]   cc:   Lindaann Pascal

## 2010-09-21 NOTE — Assessment & Plan Note (Signed)
[facility]                            CARDIOLOGY OFFICE NOTE   [name], [name]                          MRN:          [id number]  DATE:05/14/2007                            DOB:          [birth year]    [name] is followed by Dr. Daleen [name].  For some reason he was placed on my  schedule today.  I believe that this was just a scheduling problem and  he will be scheduled to see Dr. Daleen [name] for follow-up.  I do have his chart  and all of his information.  I have reviewed everything carefully with  him.  The patient does have coronary disease that is significant.  He  has not been having any chest pain.  He has no shortness of breath.  There is been no syncope or presyncope.  He has been stable.  In June  2008 he did have his lipids checked.  His LDL remained elevated and his  simvastatin dose was increased from 40 to 80.  There is a comment in the  chart that Vytorin was too expensive for the patient.  He recently has  not followed his diet as well during the holiday season and will wait  another month before rechecking his lipids.   ALLERGIES:  No known drug allergies.   MEDICATIONS:  Aspirin 81, omeprazole, simvastatin 80,  Toprol-XL 25,  enalapril 10 and One-a-Day vitamin.   OTHER MEDICAL PROBLEMS:  To be listed below.   REVIEW OF SYSTEMS:  See the HPI.  His review of systems is negative.   PHYSICAL EXAMINATION:  VITAL SIGNS:  Blood pressure is 103/65 with pulse  49.  GENERAL:  The patient is oriented to person, time and place.  Affect is  normal.  He is here with his wife today.  HEENT:  Reveals no xanthelasma.  He has normal extraocular motion.  There are no carotid bruits.  There is no jugular venous distention.  NECK:  The patient has a subcutaneous mass at the back of his neck which  most probably is a high coma.  There is another small area, one-quarter  inch in diameter that may be a mole on his right upper back.  However,  there is a dark  halo around this lesion.  I made it clear to the patient  and his wife that I was not sure that this was anything of significance,  but I strongly recommended that he be seen by dermatology for the  assessment of both areas mention.  The patient and his wife are in  agreement with this and able to proceed to make arrangements.  LUNGS:  Lungs are clear.  Respiratory effort is not labored.  CARDIAC:  S1 and S2.  There are no clicks or significant murmurs.  ABDOMEN:  Soft.  No peripheral edema.   PROBLEM LIST:  1. Coronary disease with an out-of-the-hospital lateral infarct in the      past.  He does have significant other disease as outlined by Dr.      Daleen [name] in the past.  2. History of  mild mitral valve prolapse.  3. Hypertension treated.  4. Hyperlipidemia. Will have followup lipids  in approximately 1      month.  5. History of a gastrointestinal bleed in 2004 on Plavix, aspirin and      ibuprofen.  6. History of peptic ulcer disease.  The scope back to his physical      exam and physical exam the patient had a subcutaneous mass  7. Question of a lipoma on the back of the neck and question of an      other skin lesion on his back that maybe a simple wart but rule out      more significant pathology.   The patient will have a lipid profile in 4 weeks.  He is strongly  encouraged to see dermatology as outlined above.  He will see Dr. Daleen [name]  back for followup of his cardiac status in 6 months.     Donald Abed, MD, [facility]  Electronically Signed    JDK/MedQ  DD: 05/14/2007  DT: 05/14/2007  Job #: [id number]   cc:   Donald Pascal, PA

## 2010-09-21 NOTE — Assessment & Plan Note (Signed)
[facility]                            CARDIOLOGY OFFICE NOTE   [name]                          MRN:          [id number]  DATE:05/20/2008                            DOB:          [birth year]    [name] returns today for followup and management of his coronary  artery disease.   PROBLEM LIST:  Please see my note of November 19, 2007, for details.   He had been having no angina or ischemic symptoms.  Stress Myoview on  May 14, 2008, showed good exercise tolerance, exercising 8-1/2  minutes.  He achieved 85% of his predicted maximum heart rate.  Blood  pressure response was appropriate.  Met level achieved was 10.4.  EF was  61% with no ischemia.  He has had a previous out of hospital  inferolateral wall infarct, but no significant scar was seen.   His lipids are not at goal because the fact that he has such high  baseline lipids.  On 80 mg of simvastatin, his total cholesterol went  from 316 to 206; triglycerides dropped to near normal 157; his HDL was  42.1; and his LDL is 142.8, down from 227.  He has had difficulties  taking statins in the past, but is now taking it daily.   MEDICATIONS:  1. Aspirin 81 mg a day.  2. Simvastatin 80 mg nightly.  3. Toprol-XL 25 mg per day.  4. Enalapril 10 mg per day.  5. Omeprazole 20 mg per day.  6. He carries sublingual nitroglycerin.   PHYSICAL EXAMINATION:  VITAL SIGNS:  His blood pressure today is 114/76,  his pulse is 61 and regular, and his weight is 203.  HEENT:  No change.  NECK:  Supple.  Carotid upstrokes were equal bilaterally without bruits.  Thyroid is not enlarged.  Trachea is midline.  LUNGS:  Clear to auscultation and percussion.  HEART:  A nondisplaced PMI.  Normal S1 and S2.  No gallop.  ABDOMEN:  Soft, good bowel sounds.  No midline bruit.  EXTREMITIES:  No cyanosis, clubbing, or edema.  He does have some  dependent rubor and some varicose veins.  No sign of DVT.  Dorsalis  pedis and posterior tibialis are 2+/4+ bilaterally symmetrical.  NEURO:  Intact.  SKIN:  Few ecchymoses.  MUSCULOSKELETAL:  Chronic musculoskeletal changes.   ASSESSMENT AND PLAN:  [name] is doing well.  I have asked him to  continue with his current medical program.  We will see him back again  in 6 months.  He will need lipids in September 10.     Donald C. Daleen Squibb, MD, [facility]  Electronically Signed    TCW/MedQ  DD: 05/20/2008  DT: 05/21/2008  Job #: [id number]   cc:   [name], PAC

## 2010-09-21 NOTE — Assessment & Plan Note (Signed)
[facility]                            CARDIOLOGY OFFICE NOTE   [name], [name]                          MRN:          [id number]  DATE:11/19/2007                            DOB:          02-Jan-1941    [name] returns today for followup of the following issues.  1. Coronary artery disease.  He is currently having no angina or      symptoms of ischemia.  His last stress Myoview was May 17, 2006      with good exercise tolerance, EF 60%, minimal reversible defect in      the inferior wall consistent with ischemia.  He is status post an      outer hospital inferolateral wall infarct.  2. History of mild mitral valve prolapse.  3. Hypertension.  4. Hyperlipidemia followed at the [place].  He still takes his statin      inconsistently.  5. History of gastrointestinal bleed 2004, on Plavix, aspirin, and      ibuprofen.  He apparently had a recent endoscopy and had a gastric      polyp down at [facility].  He is having some dysphagia.  6. History of peptic ulcer disease.   MEDICATIONS:  1. Aspirin 81 mg a day.  2. Omeprazole 20 mg p.o. b.i.d.  3. Simvastatin 80 mg p.o. at bedtime.  4. Toprol-XL 25 mg a day.  5. Enalapril 10 mg a day.  6. One a day vitamin.   PHYSICAL EXAMINATION:  VITAL SIGNS:  His blood pressure today is 113/74,  his pulse is 48, and regular.  He is in no acute distress.  HEENT:  Normocephalic and atraumatic.  PERRLA.  Extraocular movements  intact.  Sclerae are clear.  Facial symmetry is normal.  NECK:  Carotids upstrokes are equal bilateral without bruits.  No JVD.  Thyroid is not enlarged.  Trachea is midline.  LUNGS:  Clear.  HEART:  Regular rate and rhythm.  No gallop.  ABDOMEN:  Soft, good bowel sounds.  No midline bruit.  EXTREMITIES:  No cyanosis, clubbing, or edema.  Pulses are intact.  NEURO:  Intact.   ASSESSMENT AND PLAN:  Mr. Heiberger is doing well from our standpoint.  I  have reinforced once again for him to take  his simvastatin everyday, not  to mention his other medications.  I have told him to follow up at [facility] for possible esophageal stricture.  We will arrange  for him to have an exercise rest stress Myoview off his Toprol in  January 2010.  All of his questions were answered.     Thomas C. Daleen Squibb, MD, [facility]  Electronically Signed    TCW/MedQ  DD: 11/19/2007  DT: 11/19/2007  Job #: [id number]   cc:   Lindaann Pascal, PAC

## 2010-09-24 NOTE — Cardiovascular Report (Signed)
NAMERIDDIK, SENNA NO.:  [id number]   MEDICAL RECORD NO.:  [id number]          PATIENT TYPE:  INP   LOCATION:  1845                         FACILITY:  [facility]   PHYSICIAN:  Arvilla Meres, M.D. LHCDATE OF BIRTH:  [birth year]   DATE OF PROCEDURE:  10/28/2005  DATE OF DISCHARGE:                              CARDIAC CATHETERIZATION   PATIENT IDENTIFICATION:  Mr. Crisco is a 70 year old male with a history of  hyperlipidemia and coronary artery disease.  He suffered a myocardial  infarction in 2004 and underwent stenting of the right coronary artery.  Unfortunately, this was a complicated procedure and ended up requiring  stenting from the ostial portion down through to the PDA.  Two months after  his catheterization, he had a significant GI bleed on aspirin, Plavix and  what sounds like NSAIDS.  He ended up getting transfused about 5 units of  blood.  He was subsequently maintained on aspirin alone which he has been  compliant with.  He has been in his usual state of health until last  Thursday about eight days ago, when he had a prolonged episode of severe  chest pain which rated at 8 out of 10.  This lasted about 45 minutes.  He  took multiple nitroglycerins and finally the pain went away.  Every night  since then he has had nocturnal angina and also exertional angina.  He went  today to see Lorin Picket Long at [facility] and was sent  to the emergency room.  Initial troponin was positive at 1.5.  He is brought  to the cardiac catheterization lab for diagnostic angiography.  The patient  also complains of right lower extremity pain concerning for claudication.   PROCEDURES PERFORMED:  1.  Selective coronary angiography.  2.  Left heart cath.  3.  Left ventriculogram.  4.  Abdominal aortogram.  5.  Left subclavian angiography.   DESCRIPTION OF PROCEDURE:  The risks and benefits of the procedure were  explained.  Consent was signed and placed  on the chart.  A 6 French arterial  sheath was placed in right femoral artery using a modified Seldinger  technique.  Standard catheters including JL-4, JR-4, and angled pigtail were  used for the procedure. All catheter exchanges were made over a wire.  There  are no apparent complications.   Central aortic pressure is 101/68 with a mean of 84.  LV pressure is 94/1  with an EDP of 6.  There is no aortic stenosis.   Left main was normal.   LAD was a large vessel coursing to the apex.  There was very prominent  severe ectasia in the proximal portion with aneurysmal dilatation and  swirling flow.  However, no high grade stenosis.  In the mid LAD, there was  a diffuse 40-50% segmental plaquing. In the distal LAD, there was a tubular  70-80% stenosis.  There also were two diagonals.  The first diagonal is a  medium vessel with a long 90% stenosis.  The second diagonal was small with  branching vessel with a 70-80% proximal  lesion.   The left circumflex was a moderate sized system.  It gave off a large  branching OM-1 and a small OM-2.  There is a 40% proximal lesion in the  circumflex.  The lower branch of the OM was totally occluded and the distal  part of the vessel filled through collaterals with TIMI I flow.  There is a  40% blockage in the upper portion of the OM.   The right coronary artery was a large dominant vessel.  It gave off a  moderate sized PDA and two small PLs.  There was evidence of previously  placed stent from the ostial portion down to the PDA.  There was 40% ostial  stenosis with damping of the 6-French catheter.  This did not change with  sublingual nitroglycerin.  There is a 30% lesion in the proximal to mid  portion.   Left ventriculogram done the RAO position showed an EF of 70% with mild  mitral valve prolapse.  There was no wall motion abnormalities and there was  no mitral regurgitation.  Left ventriculogram done in the LAO position  showed an EF of 70%  with no regional wall motion abnormalities.   Abdominal aortogram showed patent renal arteries bilaterally with no  abdominal aortic aneurysm.   A left subclavian done for possible bypass surgery showed a patent LIMA to  the chest wall with no obstructions in the subclavian.   ASSESSMENT:  1.  Three vessel native coronary artery disease.  2.  The LAD has diffuse ectasia and swelling flow proximally as described      above.  There is a borderline lesion in the distal LAD and a high grade      disease in the diagonal, but these do not appear to be the culprit      lesion.  3.  Totally occluded OM1 which appears to be the culprit.  4.  Patent right coronary stents.  5.  Normal LV function with mild mitral valve prolapse but no significant      mitral regurgitation.   PLAN/DISCUSSION:  I reviewed the films with Dr. Samule Ohm and Dr. Juanda Chance. We  will plan for percutaneous intervention on the OM1 today.  He will need  aggressive risk factor modification.      Arvilla Meres, M.D. [facility]  Electronically Signed     DB/MEDQ  D:  10/28/2005  T:  10/28/2005  Job:  [id number]   cc:   Lindaann Pascal, M.D.

## 2010-09-24 NOTE — Assessment & Plan Note (Signed)
[facility]                              CARDIOLOGY OFFICE NOTE   [name], [name]                          MRN:          [id number]  DATE:11/16/2005                            DOB:          05/11/1940    [name] returns today for followup of out-of-hospital posterolateral wall  infarct.  Please see the discharge summary from 10/28/2005 for complete problem list.  He has done well since discharge. He does get a little light-headed when he  stands up.   CURRENT MEDICATIONS:  His current meds are Plavix 75 mg a day, aspirin 81 mg  a day, Enalapril 10 mg a day, Toprol XL 12.5 mg a day, simvastatin 40 mg a  day, omeprazole 20 mg b.i.d.   OF NOTE:  His total cholesterol was 304 when he was admitted to the  hospital.  His triglycerides were 167, HDL was 39, LDL was 232.  Apparently  he was taking simvastatin then, but we do not know for sure.  He had an Express stent placed to his obtuse marginal branch of the  circumplex.  He has overall good left ventricular function.  He has some  mild mitral valve prolapse.  He has a history of significant lower GI  bleeding on Plavix, aspirin, and ibuprofen in the past.  He also has a  history of peptic ulcer disease.  We only want to use Plavix for a month.   His blood pressure today is 86/60.  His pulse is 50 and regular.  His weight  is 193.  He is in no acute distress.  His carotids are full without bruits.  There is no JVD.  Thyroid is not enlarged.  Trachea is midline.  Lungs are  clear.  Heart reveals a slow rate and rhythm with a normal S1, S2.  No click  was heard.  Abdomen exam is soft.  Extremities reveal no edema.  Pulses are  present.   Electrocardiogram shows marked sinus bradycardia with a rate of about 45.  He has a mild borderline first-degree AV block.   I have had a long talk with [name]' wife.   PLAN:  1.  Stop Plavix after one month post-stenting.  2.  Continue aspirin.  3.   Decrease Enalapril to 5 mg a day.  4.  Fasting lipids and comprehensive panel in about four weeks.  I suspect      we are going to      have to switch up to something such as Vytorin 10/80 mg to achieve the      lowest LDL as possible.  We will plan on seeing him back in about six      weeks to discuss this.                               Donald C. Daleen Squibb, MD, [facility]    TCW/MedQ  DD:  11/16/2005  DT:  11/16/2005  Job #:  [id number]   cc:   [name], M.D.

## 2010-09-24 NOTE — Assessment & Plan Note (Signed)
[facility]                              CARDIOLOGY OFFICE NOTE   NAME:Petrosyan, Grayling                          MRN:          [id number]  DATE:01/02/2006                            DOB:          1940/05/26    Mr. Skeet returns today for followup of his coronary disease and  hyperlipidemia.   He is having no angina.  He is still having some swimmy-headedness but has  not cut his enalapril from 10 to 5, as instructed.  Please see the last  clinic note.   On 40 of simvastatin, his cholesterol dropped from 304 to 180.  LDL 232 to  109.  HDL did not change at 38.7.  His LFTs were normal.   MEDICATIONS:  1. Aspirin 81 mg a day.  2. Enalapril 10 mg a day.  3. Toprol 12.5 mg a day.  4. Simvastatin 40 mg a day.  5. Omeprazole 20 b.i.d.   PHYSICAL EXAMINATION:  VITAL SIGNS:  His blood pressure today is 103/62.  His heart rate is 50 and regular.  This is baseline.  Weight is 195.  NECK:  Carotids are full.  There is no JVD.  LUNGS:  Clear.  HEART:  Regular rate and rhythm.  ABDOMEN:  Soft.  EXTREMITIES:  No edema.  Pulses are intact.   PLAN:  1. Increase simvastatin 80 mg nightly.  He prefers to get it through the      [place].  Vytorin was suggested as a more expensive alternative but a more      aggressive lowering of LDL.  2. Continue other medications.  3. See me back in December, 2007.                               Thomas C. Daleen Squibb, MD, [facility]    TCW/MedQ  DD:  01/02/2006  DT:  01/02/2006  Job #:  [id number]   cc:   Dr. Lindaann Pascal

## 2010-09-24 NOTE — Assessment & Plan Note (Signed)
[facility]                            CARDIOLOGY OFFICE NOTE   NAME:Borello, Pascual                          MRN:          [id number]  DATE:04/21/2006                            DOB:          11-07-1940    Mr. Olveda comes in today for further management of his coronary artery  disease and mixed hyperlipidemia.   Unfortunately, he keeps going to the [place] and they keep changing his  medications.  They have now stopped his simvastatin 80 mg a day and put  him on Niacin 1000 mg a day.  This happened a couple of months ago as  best he can recollect.  There has been no follow up blood work.   He has had some rash and some burning up sensations on Niacin.   His current meds, as best I can tell, are:  1. Aspirin 81 mg a day.  2. Omeprazole 20 mg p.o. b.i.d.  3. Niacin 1000 mg a day.  4. Toprol-XL 25 mg a day.  5. Enalapril 10 mg a day.   EXAM TODAY:  He is in no acute distress.  Blood pressure is 108/76, his  pulse is 46 and sinus brady.  HEENT:  Ruddy complexion, PERRL, extraocular movements intact.  Sclera  clear.  Facial symmetry is normal.  Carotid upstrokes are equal  bilaterally without bruits.  There is no JVD, thyroid is not enlarged,  trachea is midline.  LUNGS:  Reveal decreased breath sounds throughout.  HEART:  Reveals a slow rate and rhythm, there is no gallop.  ABDOMINAL EXAM:  Soft with good bowel sounds.  EXTREMITIES:  Reveal no cyanosis, clubbing or edema.  Pulses are intact.  NEURO EXAM:  Intact.  SKIN:  Shows a few ecchymoses.   ASSESSMENT AND PLAN:  I have spent about 15 minutes explaining to his  wife and him that I cannot be responsible, nor can my group, if changes  are being made at the [place] without communication or substantial follow up.  I have asked him to clearly make a choice between the two.  After a long  discussion, he has decided to go with what our recommendations are and  follow up with [place].   PLAN:  1. Discontinued  Niacin.  2. Start simvastatin 80 mg a day.  3. Follow up lipids and LFTs in 6 weeks.  4. Follow up with me in 3 months.     Thomas C. Daleen Squibb, MD, [facility]  Electronically Signed    TCW/MedQ  DD: 04/21/2006  DT: 04/21/2006  Job #: [id number]   cc:   Lindaann Pascal, Dr.

## 2010-09-24 NOTE — Cardiovascular Report (Signed)
NAME:  [name], BLOMQUIST NO.:  [id number]   MEDICAL RECORD NO.:  [id number]          PATIENT TYPE:  INP   LOCATION:  1845                         FACILITY:  [facility]   PHYSICIAN:  Charlies Constable, M.D. [facility] DATE OF BIRTH:  [birth year]   DATE OF PROCEDURE:  10/28/2005  DATE OF DISCHARGE:                              CARDIAC CATHETERIZATION   HISTORY:  Mr. Ozer is 70 years old and has known coronary disease and  previously had five bare metal stents placed in the right coronary at  [facility].  He is admitted today with chest pain and positive  troponins.  He was seen by Dr. Daleen Squibb and Dr. Gala Romney did the diagnostic  study.  The diagnostic study showed that his stents in the right coronary  were patent.  He had a markedly ectatic LAD with 70% stenosis in the distal  portion.  He had total occlusion of a marginal branch which appeared to be  an acute occlusion and filled by collaterals.  We made decision to intervene  on the circumflex marginal vessel.   PROCEDURE:  The procedure was performed via the right femoral artery with a  CLS 6-French guiding catheter with side holes.  We used a PT2 light support  and crossed the totally obstructed circumflex marginal vessel with the wire  without too much difficulty.  We predilated 2 x 15 mm Maverick performing  three inflations up to 10 atmospheres for 30 seconds.  We then deployed a  2.5 x 20 mm Express stent deploying this with one inflation of 14  atmospheres for 30 seconds.  We post dilated with a 2.75 x 50 mm Quantum  Maverick performing two inflations up to 16 atmospheres for 30 seconds.  The  final diagnostic study was then performed through the guiding catheter.  The  patient tolerated the procedure well and left the laboratory in satisfactory  condition.   RESULTS:  Initially, the circumflex marginal vessel was completely occluded  with some collateral filling.  Following stenting, the stenosis improved  from 100% to  0% and the flow from TIMI 0 to TIMI III flow.   CONCLUSION:  Successful PCI of a totally occluded circumflex marginal vessel  with improvement in narrowing from 100% to 0% and improvement in flow from  TIMI 0 to TIMI III flow using a bare metal stent.   DISPOSITION:  The patient was returned to the recovery room for further  observation.           ______________________________  Charlies Constable, M.D. [facility]     BB/MEDQ  D:  10/28/2005  T:  10/28/2005  Job:  [id number]   cc:   Arvilla Meres, M.D. [facility]  [employer]  [address]  [place]  [place] [address]   Jesse Sans. Wall, M.D.  [address]  [address]  [place]  [place] [address]

## 2010-09-24 NOTE — Discharge Summary (Signed)
[name], [name] NO.:  [id number]   MEDICAL RECORD NO.:  [id number]          PATIENT TYPE:  INP   LOCATION:  2029                         FACILITY:  [facility]   PHYSICIAN:  Jesse Sans. Wall, M.D.   DATE OF BIRTH:  1941-02-18   DATE OF ADMISSION:  10/28/2005  DATE OF DISCHARGE:                                 DISCHARGE SUMMARY   PRIMARY CARDIOLOGIST:  Dr. Maisie Fus C. Wall   DIAGNOSES:  1.  Out of hospital, posterolateral wall myocardial infarction.  2.  Status post percutaneous coronary intervention with an Express stent to      an obtuse marginal branch of the circumflex.  The patient has normal      left ventricular function.  Previous history of a right coronary artery      intervention with 5 stents, all patent, at this catheterization.  Left      anterior descending is ectatic with a 70% to 80% and a diagonal-II 90%      and a diagonal-I 40% to 50% in the mid LAD, 70% to 80% in the distal      LAD.  There were collaterals to the OM branch that were subsequently      intervened upon.  Abdominal aorta was normal with normal renals.  She      has mild mitral valve prolapse on SV gram.  3.  Hypertension.  4.  Hyperlipidemia.  5.  History of a major lower gastrointestinal bleed in 2004, on Plavix,      aspirin, and ibuprofen.  He was found to have a polyp per his      recollection.  6.  History of peptic ulcer disease.  7.  Lumbar degenerative joint disease with right lower extremity radicular      symptoms.   PROCEDURES:  Urgent cardiac catheterization on October 28, 2005, PCI by Dr.  Charlies Constable as described above on the same date.   ADMISSION:  [name] is a 70 year old gentleman with a known history of  CAD, history of MI and stenting at the right coronary artery at [facility] in 2004.  He presented with a 2-week history of some chest  discomfort with severe pain the week prior.   His cardiac enzymes were positive.  He was taken urgently to  the  catheterization lab with the above findings.  He was stented successfully.  He has had a very benign post procedure course.   DISCHARGE MEDICATIONS:  1.  Plavix 75 mg a day for 1 month because of history of severe bleeding.  2.  Enteric-coated aspirin 81 mg a day.  3.  Enalapril 10 mg a day.  4.  Prilosec or its equivalent 1 p.o. daily.  5.  Lipitor or its equivalent 40 mg a day.  6.  Toprol-XL 25 mg 1/2 tablet each day.  7.  Sublingual nitroglycerin p.r.n.   It was reviewed on how to take and how to call 911.   The patient will be discharged home today.  He will see Dr. Juanito Doom back in  the  office in a couple of weeks.  Also will call tomorrow for an  appointment.  The patient will call us if he has any problems with his  groin, which was reviewed in detail.  He will follow up with [facility] after he sees Dr. Daleen Squibb.   Cardiac rehab will be initiated post his first visit.  He has been told not  to do any heavy lifting.  He is retired, so going back to work is not  applicable.      Thomas C. Wall, M.D.  Electronically Signed     TCW/MEDQ  D:  10/30/2005  T:  10/30/2005  Job:  [id number]   cc:   Lindaann Pascal, PA-C  [facility]

## 2010-09-24 NOTE — H&P (Signed)
[name], [name] NO.:  [id number]   MEDICAL RECORD NO.:  [id number]          PATIENT TYPE:  EMS   LOCATION:  MAJO                         FACILITY:  [facility]   PHYSICIAN:  Thomas C. Wall, M.D.   DATE OF BIRTH:  Sep 05, 1940   DATE OF ADMISSION:  10/28/2005  DATE OF DISCHARGE:                                HISTORY & PHYSICAL   CARDIOLOGIST:  He is new to Dr. Daleen Squibb.   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   CHIEF COMPLAINT:  Chest discomfort.   HISTORY OF PRESENT ILLNESS:  Ms. Longenecker is a very pleasant 70 year old male  patient with a history of coronary artery disease, status post myocardial  infarction in 2004, treated at [facility] with bare metal stenting  times five, who is referred to the emergency department by Mr. Jacqulyn Bath from  [facility] secondary to chest pain. The patient  notes chest discomfort on his left side for the last two weeks. This was a  pressure-type sensation. He notes radiation to his neck on his left as well  as his left arm. He has had some tingling in his left arm. He has had some  associated shortness of breath, nausea and diaphoresis. He denies any real  association with exertion. His worst episode was last week. He had to take  three sublingual nitroglycerin with relief. He denies any syncope. He note  that his pain is just like his previous myocardial infarction pain.  He  denies any current pain. He is admitted for further evaluation.   PAST MEDICAL HISTORY:  Coronary artery disease, status post myocardial  infarction in 2004 and bare metal stent times five. We are not exactly sure  which vessel this was to. He apparently had a nuclear study done at the  [facility] a year ago. This was a pharmacology study and he says it was  okay. He also has a history of upper GI bleeding. He says it is from a  polyp. At the time he was on NSAIDs, aspirin,  and Plavix. This was about two  weeks after his myocardial infarction. He had to go undergo transfusion  times four to five units of packed red blood cells. He also underwent  endoscopy to stop the bleeding. He has a history of treated hypertension and  treated hyperlipidemia.  He also has lumbar degenerative joint disease. He  notes a recent history of right lower extremity radicular symptoms.   MEDICATIONS:  1.  Aspirin 81 mg daily.  2.  Enalapril 10 mg daily.  3.  Prilosec 20 mg daily.  4.  Lipitor 40 mg daily.  5.  Toprol-XL 25 mg half tablet daily.  6.  Nitroglycerin p.r.n. chest pain.   ALLERGIES:  IBUPROFEN.   SOCIAL HISTORY:  The patient lives in [place] with his wife. He is retired.  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 He was  confined when he had his myocardial infarction. He has a 45-pack year  history  of tobacco smoking. He quit about 30 years ago. He denies any  alcohol abuse.   FAMILY HISTORY:  Significant for coronary artery disease. His father died of  a myocardial infarction in his 47s.   REVIEW OF SYSTEMS:  Please see HPI.  He denies any fever, chills, cough,  sore throat, headache, known hematochezia, hematuria, dysuria, claudication  symptoms consistent with amaurosis fugax or TIAs.  He denies any orthopnea,  paroxysmal nocturnal dyspnea.  He denies any skin or inner ear changes or  voice changes. The rest of the review of systems are negative.   PHYSICAL EXAMINATION:  GENERAL: He is a well-developed, well-nourished male  in no distress.  VITAL SIGNS: Blood pressure is 98/57, pulse 50, respirations 18, temperature  97.9. Oxygen saturation 99% on two liters.  HEENT: Head--normocephalic and atraumatic. Eyes--PERRLA. EOMI. Sclerae are  clear. Oropharynx pink without exudate.  NECK: Supple without lymphadenopathy, thyromegaly, bruits, or JVD.  CARDIAC: Normal S1 and S2. Regular rate and rhythm, without murmurs, clicks,  rubs, or  gallops.  VASCULAR:  Femoral pulses are 2+ bilaterally without bruits. Dorsalis pedis  and posterior tibialis pulses are 2+ bilaterally.  LUNGS:  Clear to auscultation without rales, rhonchi, or wheezes.  SKIN: Without rashes.  ABDOMEN: Soft and nontender with normoactive bowel sounds. No organomegaly,  rebound, or guarding.  EXTREMITIES:  Without clubbing, cyanosis, or edema.  MUSCULOSKELETAL:  Without spine or CVA tenderness.  NEUROLOGIC:  He is alert and oriented times three. Cranial nerves II-XII are  grossly intact.   Chest x-ray shows no acute disease.  EKG today shows sinus bradycardia with  a heart rate of 48, rightward axis, T-wave inversions in III, aVF, and V4  through V6, Q waves in III and aVF.   LABORATORY DATA:  Point of care markers are positive for myocardial  infarction. CK-MB is 16.9 and troponin-I of 1.62. Hemoglobin 16.7,  hematocrit 45.2, white count 8200, platelet count 195,000. Sodium 138,  potassium 4.6, BUN 19, creatinine 1.3, glucose 107.  LFTs okay.  INR 1.0.   IMPRESSION:  1.  Acute coronary syndrome.  2.  Coronary artery disease, status post myocardial infarction in 2004,      treated with percutaneous coronary intervention times five.  3.  Treated hyperlipidemia.  4.  Asymptomatic sinus bradycardia.  5.  Treated hypertension.      1.  Relative hypotension.  6.  History of severe upper gastrointestinal bleeding.  7.  Lumbar degenerative disk disease.   PLAN:  The patient is also being examined by Dr. Daleen Squibb. We plan to him and  further evaluate him with cardiac catheterization today. We will not place  him on heparin at this point in time given his history of severe upper GI  bleeding. If he does require PCI we will hopefully place a bare metal stent  to limit his use of Plavix. Risks and benefits of the procedure have been  explained to the patient and his wife and they both agree to proceed.      Tereso Newcomer, P.A.      Thomas C. Wall,  M.D.  Electronically Signed    SW/MEDQ  D:  10/28/2005  T:  10/28/2005  Job:  [id number]   cc:   Lindaann Pascal, PA-C   [facility]

## 2010-10-11 ENCOUNTER — Other Ambulatory Visit: Payer: Self-pay | Admitting: Cardiology

## 2010-10-11 LAB — HEPATIC FUNCTION PANEL
ALT: 12 U/L (ref 0–53)
AST: 17 U/L (ref 0–37)
Albumin: 4.2 g/dL (ref 3.5–5.2)
Indirect Bilirubin: 0.5 mg/dL (ref 0.0–0.9)
Total Bilirubin: 0.6 mg/dL (ref 0.3–1.2)
Total Protein: 6.5 g/dL (ref 6.0–8.3)

## 2010-10-11 LAB — LIPID PANEL
Cholesterol: 205 mg/dL — ABNORMAL HIGH (ref 0–200)
HDL: 42 mg/dL (ref >39)

## 2010-10-21 ENCOUNTER — Other Ambulatory Visit: Payer: Self-pay | Admitting: Cardiology

## 2011-05-04 ENCOUNTER — Encounter: Payer: Self-pay | Admitting: Cardiology

## 2011-06-01 ENCOUNTER — Telehealth: Payer: Self-pay

## 2011-06-01 NOTE — Telephone Encounter (Signed)
Rosie Fate from [facility] called.Stated she was seeing patient who recently had a rt knee replacement.States his pulse 110/min and patient c/o nausea.States patient not aware his heart is beating fast.Spoke to Norma Fredrickson NP, she advised to send to office a list of patient's current medications.

## 2011-06-27 ENCOUNTER — Other Ambulatory Visit: Payer: Self-pay | Admitting: Cardiology

## 2011-07-15 ENCOUNTER — Ambulatory Visit (INDEPENDENT_AMBULATORY_CARE_PROVIDER_SITE_OTHER): Payer: BC Managed Care – PPO | Admitting: Cardiology

## 2011-07-15 ENCOUNTER — Encounter: Payer: Self-pay | Admitting: Cardiology

## 2011-07-15 VITALS — BP 131/75 | HR 56 | Resp 18 | Ht 74.0 in | Wt 198.0 lb

## 2011-07-15 DIAGNOSIS — I10 Essential (primary) hypertension: Secondary | ICD-10-CM

## 2011-07-15 DIAGNOSIS — I2510 Atherosclerotic heart disease of native coronary artery without angina pectoris: Secondary | ICD-10-CM

## 2011-07-15 DIAGNOSIS — E785 Hyperlipidemia, unspecified: Secondary | ICD-10-CM

## 2011-07-15 DIAGNOSIS — I498 Other specified cardiac arrhythmias: Secondary | ICD-10-CM | POA: Diagnosis not present

## 2011-07-15 MED ORDER — ENALAPRIL MALEATE 10 MG PO TABS: 10.0000 mg | ORAL_TABLET | Freq: Every day | ORAL | Status: DC

## 2011-07-15 MED ORDER — SIMVASTATIN 40 MG PO TABS: 40.0000 mg | ORAL_TABLET | Freq: Every day | ORAL | Status: DC

## 2011-07-15 MED ORDER — OMEPRAZOLE 20 MG PO CPDR: 20.0000 mg | DELAYED_RELEASE_CAPSULE | Freq: Every day | ORAL | Status: DC

## 2011-07-15 MED ORDER — NITROGLYCERIN 0.4 MG SL SUBL: 0.4000 mg | SUBLINGUAL_TABLET | SUBLINGUAL | Status: DC | PRN

## 2011-07-15 NOTE — Patient Instructions (Signed)
**Note De-identified  Obfuscation** Your physician recommends that you continue on your current medications as directed. Please refer to the Current Medication list given to you today.  Your physician recommends that you schedule a follow-up appointment in: 1 year  

## 2011-07-15 NOTE — Assessment & Plan Note (Signed)
Stable. Cannot use beta blocker. No change in meds.

## 2011-07-15 NOTE — Assessment & Plan Note (Signed)
Stable. Continue medical therapy. Return to the clinic in one year.

## 2011-07-15 NOTE — Progress Notes (Signed)
HPI [name] comes in today for evaluation and management of his coronary disease, history of hypertension hyperlipidemia, and history of  Bradycardia.  He is doing well. He denies any chest pain, angina, orthopnea, PND, edema, palpitations or syncope.   His wife insures me that he is compliant.He needs his meds renewed today. Past Medical History  Diagnosis Date  . CAD in native artery   . Bradycardia   . MI (myocardial infarction) 2004  . History of mitral valve prolapse   . Hyperlipidemia, mixed   . Hypertension   . History of GI bleed   . History of peptic ulcer disease     Current Outpatient Prescriptions  Medication Sig Dispense Refill  . aspirin 81 MG tablet Take 81 mg by mouth daily.        . enalapril (VASOTEC) 10 MG tablet Take 1 tablet (10 mg total) by mouth daily.  90 tablet  3  . Multiple Vitamin (MULTIVITAMIN) tablet Take 1 tablet by mouth daily.        . nitroGLYCERIN (NITROSTAT) 0.4 MG SL tablet Place 1 tablet (0.4 mg total) under the tongue every 5 (five) minutes as needed. May repeat for up to 3 doses.  90 tablet  3  . omeprazole (PRILOSEC) 20 MG capsule Take 1 capsule (20 mg total) by mouth daily.  90 capsule  3  . simvastatin (ZOCOR) 40 MG tablet Take 1 tablet (40 mg total) by mouth at bedtime.  90 tablet  3    Allergies  Allergen Reactions  . Ibuprofen     Family History  Problem Relation Age of Onset  . Heart attack Mother   . Heart attack Father   . Coronary artery disease Other     History   Social History  . Marital Status: Married    Spouse Name: N/A    Number of Children: N/A  . Years of Education: N/A   Occupational History  . Retired in 2006    Social History Main Topics  . Smoking status: Former Smoker    Quit date: 05/09/1976  . Smokeless tobacco: Not on file  . Alcohol Use: No  . Drug Use: No  . Sexually Active: Not on file   Other Topics Concern  . Not on file   Social History Narrative   MarriedDisabledExercises  regularly    ROS ALL NEGATIVE EXCEPT THOSE NOTED IN HPI  PE  General Appearance: well developed, well nourished in no acute distress HEENT: symmetrical face, PERRLA, good dentition  Neck: no JVD, thyromegaly, or adenopathy, trachea midline Chest: symmetric without deformity Cardiac: PMI non-displaced, RRR, normal S1, S2, no gallop or murmur Lung: clear to ausculation and percussion Vascular: all pulses full without bruits  Abdominal: nondistended, nontender, good bowel sounds, no HSM, no bruits Extremities: no cyanosis, clubbing or edema, no sign of DVT, no varicosities  Skin: normal color, no rashes Neuro: alert and oriented x 3, non-focal Pysch: normal affect  EKG Sinus bradycardia, otherwise normal EKG BMET    Component Value Date/Time   NA 140 05/17/2006 0831   K 4.6 05/17/2006 0831   CL 102 05/17/2006 0831   CO2 33* 05/17/2006 0831   GLUCOSE 93 05/18/2006 1245   GLUCOSE 89 05/17/2006 0831   BUN 16 05/17/2006 0831   CREATININE 1.3 05/17/2006 0831   CALCIUM 9.7 05/17/2006 0831   GFRNONAA 59 05/17/2006 0831    Lipid Panel     Component Value Date/Time   CHOL 205* 10/11/2010 1610  TRIG 166* 10/11/2010 0828   HDL 42 10/11/2010 0828   CHOLHDL 4.9 10/11/2010 0828   VLDL 33 10/11/2010 0828   LDLCALC 130* 10/11/2010 0828    CBC No results found for this basename: wbc, rbc, hgb, hct, plt, mcv, mch, mchc, rdw, neutrabs, lymphsabs, monoabs, eosabs, basosabs

## 2011-08-02 ENCOUNTER — Encounter: Payer: Self-pay | Admitting: Cardiology

## 2011-08-02 DIAGNOSIS — I2510 Atherosclerotic heart disease of native coronary artery without angina pectoris: Secondary | ICD-10-CM | POA: Diagnosis not present

## 2011-08-02 DIAGNOSIS — I10 Essential (primary) hypertension: Secondary | ICD-10-CM | POA: Diagnosis not present

## 2011-10-31 DIAGNOSIS — I10 Essential (primary) hypertension: Secondary | ICD-10-CM | POA: Diagnosis not present

## 2011-10-31 DIAGNOSIS — E785 Hyperlipidemia, unspecified: Secondary | ICD-10-CM | POA: Diagnosis not present

## 2011-12-01 DIAGNOSIS — E785 Hyperlipidemia, unspecified: Secondary | ICD-10-CM | POA: Diagnosis not present

## 2011-12-20 DIAGNOSIS — E785 Hyperlipidemia, unspecified: Secondary | ICD-10-CM | POA: Diagnosis not present

## 2011-12-20 DIAGNOSIS — I10 Essential (primary) hypertension: Secondary | ICD-10-CM | POA: Diagnosis not present

## 2011-12-22 ENCOUNTER — Encounter: Payer: Self-pay | Admitting: Gastroenterology

## 2012-02-03 ENCOUNTER — Emergency Department (HOSPITAL_COMMUNITY)
Admission: EM | Admit: 2012-02-03 | Discharge: 2012-02-03 | Disposition: A | Discharge status: Home / Self Care | Payer: BC Managed Care – PPO | Attending: Emergency Medicine | Admitting: Emergency Medicine

## 2012-02-03 ENCOUNTER — Encounter (HOSPITAL_COMMUNITY): Payer: Self-pay | Admitting: Emergency Medicine

## 2012-02-03 ENCOUNTER — Emergency Department (HOSPITAL_COMMUNITY): Payer: BC Managed Care – PPO

## 2012-02-03 DIAGNOSIS — Z7982 Long term (current) use of aspirin: Secondary | ICD-10-CM | POA: Diagnosis not present

## 2012-02-03 DIAGNOSIS — N200 Calculus of kidney: Secondary | ICD-10-CM

## 2012-02-03 DIAGNOSIS — I2510 Atherosclerotic heart disease of native coronary artery without angina pectoris: Secondary | ICD-10-CM | POA: Diagnosis not present

## 2012-02-03 DIAGNOSIS — I10 Essential (primary) hypertension: Secondary | ICD-10-CM | POA: Insufficient documentation

## 2012-02-03 DIAGNOSIS — N1330 Unspecified hydronephrosis: Secondary | ICD-10-CM | POA: Diagnosis not present

## 2012-02-03 DIAGNOSIS — N201 Calculus of ureter: Secondary | ICD-10-CM | POA: Diagnosis not present

## 2012-02-03 DIAGNOSIS — R109 Unspecified abdominal pain: Secondary | ICD-10-CM | POA: Diagnosis not present

## 2012-02-03 DIAGNOSIS — I059 Rheumatic mitral valve disease, unspecified: Secondary | ICD-10-CM | POA: Insufficient documentation

## 2012-02-03 DIAGNOSIS — R112 Nausea with vomiting, unspecified: Secondary | ICD-10-CM | POA: Diagnosis not present

## 2012-02-03 DIAGNOSIS — Z8711 Personal history of peptic ulcer disease: Secondary | ICD-10-CM | POA: Insufficient documentation

## 2012-02-03 DIAGNOSIS — E782 Mixed hyperlipidemia: Secondary | ICD-10-CM | POA: Insufficient documentation

## 2012-02-03 DIAGNOSIS — R1110 Vomiting, unspecified: Secondary | ICD-10-CM | POA: Insufficient documentation

## 2012-02-03 DIAGNOSIS — R6883 Chills (without fever): Secondary | ICD-10-CM | POA: Insufficient documentation

## 2012-02-03 DIAGNOSIS — K2970 Gastritis, unspecified, without bleeding: Secondary | ICD-10-CM | POA: Diagnosis not present

## 2012-02-03 DIAGNOSIS — I252 Old myocardial infarction: Secondary | ICD-10-CM | POA: Insufficient documentation

## 2012-02-03 DIAGNOSIS — E785 Hyperlipidemia, unspecified: Secondary | ICD-10-CM | POA: Diagnosis not present

## 2012-02-03 DIAGNOSIS — Z79899 Other long term (current) drug therapy: Secondary | ICD-10-CM | POA: Diagnosis not present

## 2012-02-03 LAB — CBC WITH DIFFERENTIAL/PLATELET
Basophils Absolute: 0 10*3/uL (ref 0.0–0.1)
Lymphs Abs: 0.2 10*3/uL — ABNORMAL LOW (ref 0.7–4.0)
MCHC: 33.9 g/dL (ref 30.0–36.0)
Monocytes Absolute: 0.1 10*3/uL (ref 0.1–1.0)
Myelocytes: 0 %
Neutro Abs: 3.9 10*3/uL (ref 1.7–7.7)
Promyelocytes Absolute: 0 %
RBC: 4.52 MIL/uL (ref 4.22–5.81)

## 2012-02-03 LAB — COMPREHENSIVE METABOLIC PANEL
Albumin: 4 g/dL (ref 3.5–5.2)
Creatinine, Ser: 1.45 mg/dL — ABNORMAL HIGH (ref 0.50–1.35)
GFR calc Af Amer: 55 mL/min — ABNORMAL LOW (ref >90)
GFR calc non Af Amer: 47 mL/min — ABNORMAL LOW (ref >90)
Glucose, Bld: 116 mg/dL — ABNORMAL HIGH (ref 70–99)
Potassium: 4.1 mEq/L (ref 3.5–5.1)
Sodium: 137 mEq/L (ref 135–145)
Total Protein: 6.8 g/dL (ref 6.0–8.3)

## 2012-02-03 MED ORDER — SODIUM CHLORIDE 0.9 % IV SOLN
Freq: Once | INTRAVENOUS | Status: AC
  Administered 2012-02-03: 20:00:00 via INTRAVENOUS

## 2012-02-03 MED ORDER — OXYCODONE-ACETAMINOPHEN 5-325 MG PO TABS: 1.0000 | ORAL_TABLET | Freq: Four times a day (QID) | ORAL | Status: AC | PRN

## 2012-02-03 MED ORDER — ONDANSETRON HCL 4 MG/2ML IJ SOLN
4.0000 mg | Freq: Once | INTRAMUSCULAR | Status: AC
  Administered 2012-02-03: 4 mg via INTRAVENOUS
  Filled 2012-02-03: qty 2

## 2012-02-03 MED ORDER — TAMSULOSIN HCL 0.4 MG PO CAPS: 0.4000 mg | ORAL_CAPSULE | Freq: Every day | ORAL | Status: DC

## 2012-02-03 MED ORDER — ONDANSETRON 4 MG PO TBDP: 4.0000 mg | ORAL_TABLET | Freq: Three times a day (TID) | ORAL | Status: DC | PRN

## 2012-02-03 MED ORDER — HYDROMORPHONE HCL PF 1 MG/ML IJ SOLN
1.0000 mg | Freq: Once | INTRAMUSCULAR | Status: AC
  Administered 2012-02-03: 1 mg via INTRAVENOUS
  Filled 2012-02-03: qty 1

## 2012-02-03 NOTE — ED Provider Notes (Signed)
History   This chart was scribed for Benny Lennert, MD by Toya Smothers. The patient was seen in room APA07/APA07. Patient's care was started at 37.  CSN: [id number]  Arrival date & time 02/03/12  2956   First MD Initiated Contact with Patient 02/03/12 2004      Chief Complaint  Patient presents with  . Abdominal Pain  . Emesis   Patient is a 71 y.o. male presenting with abdominal pain and vomiting. The history is provided by the patient. No language interpreter was used.  Abdominal Pain The primary symptoms of the illness include abdominal pain and vomiting. The primary symptoms of the illness do not include fatigue or diarrhea. The current episode started 1 to 2 hours ago. The onset of the illness was sudden.  The pain came on suddenly. The abdominal pain is located in the left flank. The abdominal pain radiates to the back. The abdominal pain is relieved by nothing.  The patient states that she believes she is currently not pregnant. Risk factors for an acute abdominal problem include being elderly. Additional symptoms associated with the illness include chills. Symptoms associated with the illness do not include hematuria, frequency or back pain.  Emesis  Associated symptoms include abdominal pain and chills. Pertinent negatives include no cough, no diarrhea and no headaches.   [name] is a 71 y.o. male with a h/o MI '04 who presents to the Emergency Department complaining of 2 hours of constant moderate left flank abdominal pain. Pain is described as sharp and radiating to the back, unlike any pain before. Denies h/o kidney stones. Prior to arrival, symptoms have not been treated. Pt endorses associate chills and emesis. Denies hematemesis, blood in stool, and cough.  Pt lists PCP as Dr. Alphonsa Overall     Past Medical History  Diagnosis Date  . CAD in native artery   . Bradycardia   . MI (myocardial infarction) 2004  . History of mitral valve prolapse   . Hyperlipidemia,  mixed   . Hypertension   . History of GI bleed   . History of peptic ulcer disease     Past Surgical History  Procedure Date  . Coronary stent placement 2004, 2007    5 in 2004, 1 in 2007    Family History  Problem Relation Age of Onset  . Heart attack Mother   . Heart attack Father   . Coronary artery disease Other     History  Substance Use Topics  . Smoking status: Former Smoker    Quit date: 05/09/1976  . Smokeless tobacco: Not on file  . Alcohol Use: No    Review of Systems  Constitutional: Positive for chills. Negative for fatigue.  HENT: Negative for congestion, sinus pressure and ear discharge.   Eyes: Negative for discharge.  Respiratory: Negative for cough.   Cardiovascular: Negative for chest pain.  Gastrointestinal: Positive for vomiting and abdominal pain. Negative for diarrhea.  Genitourinary: Negative for frequency and hematuria.  Musculoskeletal: Negative for back pain.  Skin: Negative for rash.  Neurological: Negative for seizures and headaches.  Hematological: Negative.   Psychiatric/Behavioral: Negative for hallucinations.    Allergies  Ibuprofen  Home Medications   Current Outpatient Rx  Name Route Sig Dispense Refill  . ASPIRIN 81 MG PO TABS Oral Take 81 mg by mouth daily.      . ENALAPRIL MALEATE 10 MG PO TABS Oral Take 1 tablet (10 mg total) by mouth daily. 90 tablet 3  .  ONE-DAILY MULTI VITAMINS PO TABS Oral Take 1 tablet by mouth daily.      [name] NITROGLYCERIN 0.4 MG SL SUBL Sublingual Place 1 tablet (0.4 mg total) under the tongue every 5 (five) minutes as needed. May repeat for up to 3 doses. 90 tablet 3  . OMEPRAZOLE 20 MG PO CPDR Oral Take 1 capsule (20 mg total) by mouth daily. 90 capsule 3  . SIMVASTATIN 40 MG PO TABS Oral Take 1 tablet (40 mg total) by mouth at bedtime. 90 tablet 3    BP 123/86  Pulse 78  Temp 99 F (37.2 C) (Oral)  Resp 18  SpO2 100%  Physical Exam  Constitutional: He is oriented to person, place, and  time. He appears well-developed and well-nourished.  HENT:  Head: Normocephalic.  Right Ear: External ear normal.  Left Ear: External ear normal.  Nose: Nose normal.  Mouth/Throat: Oropharynx is clear and moist.  Eyes: EOM are normal. Pupils are equal, round, and reactive to light. Right eye exhibits no discharge. Left eye exhibits no discharge.  Neck: Normal range of motion. Neck supple. No tracheal deviation present.       No nuchal rigidity no meningeal signs  Cardiovascular: Normal rate and regular rhythm.   Pulmonary/Chest: Effort normal and breath sounds normal. No stridor. No respiratory distress. He has no wheezes. He has no rales.  Abdominal: Soft. He exhibits no distension and no mass. There is no rebound and no guarding.       Minor left flank tenderness.  Musculoskeletal: Normal range of motion. He exhibits no edema and no tenderness.  Neurological: He is alert and oriented to person, place, and time. He has normal reflexes. No cranial nerve deficit. Coordination normal.  Skin: Skin is warm. No rash noted. He is not diaphoretic. No erythema. No pallor.       No pettechia no purpura    ED Course  Procedures (including critical care time) DIAGNOSTIC STUDIES: Oxygen Saturation is 100% on room air, normal by my interpretation.    COORDINATION OF CARE: 20:18- Evaluated Pt. Pt is awake, alert, and oriented.   Labs Reviewed - No data to display No results found.   No diagnosis found.    MDM     The chart was scribed for me under my direct supervision.  I personally performed the history, physical, and medical decision making and all procedures in the evaluation of this patient.Benny Lennert, MD 02/03/12 2132

## 2012-02-03 NOTE — ED Notes (Signed)
Pt c/o lower left abdominal pain that radiates to lower back. Pt states pain began around 530pm this evening. Pt describes pain as sharp. Pt denies any blood in vomit. Pt states he went to PCP this morning for routine check-up and was told "everything was good". Pt has extensive cardiac hx including 2 MIs and 6 stents. Pt has 20g IV in left wrist. Pt was given 4mg  of zofran in route but no relief met.

## 2012-02-06 ENCOUNTER — Encounter: Payer: BC Managed Care – PPO | Admitting: Gastroenterology

## 2012-02-06 MED FILL — Oxycodone w/ Acetaminophen Tab 5-325 MG: ORAL | Qty: 6 | Status: AC

## 2012-02-09 DIAGNOSIS — E785 Hyperlipidemia, unspecified: Secondary | ICD-10-CM | POA: Diagnosis not present

## 2012-03-19 DIAGNOSIS — N200 Calculus of kidney: Secondary | ICD-10-CM | POA: Diagnosis not present

## 2012-03-19 DIAGNOSIS — Z125 Encounter for screening for malignant neoplasm of prostate: Secondary | ICD-10-CM | POA: Diagnosis not present

## 2012-05-14 DIAGNOSIS — E785 Hyperlipidemia, unspecified: Secondary | ICD-10-CM | POA: Diagnosis not present

## 2012-05-14 DIAGNOSIS — I10 Essential (primary) hypertension: Secondary | ICD-10-CM | POA: Diagnosis not present

## 2012-05-22 DIAGNOSIS — R748 Abnormal levels of other serum enzymes: Secondary | ICD-10-CM | POA: Diagnosis not present

## 2012-05-30 ENCOUNTER — Other Ambulatory Visit: Payer: Self-pay | Admitting: Cardiology

## 2012-06-08 ENCOUNTER — Other Ambulatory Visit: Payer: Self-pay | Admitting: Family Medicine

## 2012-06-08 DIAGNOSIS — R748 Abnormal levels of other serum enzymes: Secondary | ICD-10-CM

## 2012-06-13 ENCOUNTER — Encounter (HOSPITAL_COMMUNITY): Payer: BC Managed Care – PPO

## 2012-06-15 ENCOUNTER — Encounter (HOSPITAL_COMMUNITY)
Admission: RE | Admit: 2012-06-15 | Discharge: 2012-06-15 | Disposition: A | Discharge status: Home / Self Care | Payer: BC Managed Care – PPO | Source: Ambulatory Visit | Attending: Family Medicine | Admitting: Family Medicine

## 2012-06-15 ENCOUNTER — Encounter (HOSPITAL_COMMUNITY): Payer: Self-pay

## 2012-06-15 DIAGNOSIS — R748 Abnormal levels of other serum enzymes: Secondary | ICD-10-CM

## 2012-06-15 DIAGNOSIS — M19019 Primary osteoarthritis, unspecified shoulder: Secondary | ICD-10-CM | POA: Diagnosis not present

## 2012-06-15 MED ORDER — TECHNETIUM TC 99M MEDRONATE IV KIT
25.0000 | PACK | Freq: Once | INTRAVENOUS | Status: AC | PRN
  Administered 2012-06-15: 25 via INTRAVENOUS

## 2012-07-17 DIAGNOSIS — N401 Enlarged prostate with lower urinary tract symptoms: Secondary | ICD-10-CM | POA: Diagnosis not present

## 2012-07-17 DIAGNOSIS — Z125 Encounter for screening for malignant neoplasm of prostate: Secondary | ICD-10-CM | POA: Diagnosis not present

## 2012-07-17 DIAGNOSIS — N200 Calculus of kidney: Secondary | ICD-10-CM | POA: Diagnosis not present

## 2012-07-17 DIAGNOSIS — N138 Other obstructive and reflux uropathy: Secondary | ICD-10-CM | POA: Diagnosis not present

## 2012-07-19 ENCOUNTER — Encounter: Payer: Self-pay | Admitting: Cardiology

## 2012-07-19 ENCOUNTER — Ambulatory Visit (INDEPENDENT_AMBULATORY_CARE_PROVIDER_SITE_OTHER): Payer: BC Managed Care – PPO | Admitting: Cardiology

## 2012-07-19 VITALS — BP 114/79 | HR 68 | Ht 74.0 in | Wt 202.0 lb

## 2012-07-19 DIAGNOSIS — I10 Essential (primary) hypertension: Secondary | ICD-10-CM | POA: Diagnosis not present

## 2012-07-19 DIAGNOSIS — I2510 Atherosclerotic heart disease of native coronary artery without angina pectoris: Secondary | ICD-10-CM

## 2012-07-19 NOTE — Patient Instructions (Addendum)
Your physician recommends that you schedule a follow-up appointment in: ONE YEAR WITH

## 2012-07-19 NOTE — Progress Notes (Signed)
HPI [name] returns today for evaluation and management of his history of coronary artery disease and multiple PCI. He continues to do remarkably well with no symptoms of angina or ischemia. He is very compliant with medications. Blood work followed by primary care.  Past Medical History  Diagnosis Date  . CAD in native artery   . Bradycardia   . MI (myocardial infarction) 2004  . History of mitral valve prolapse   . Hyperlipidemia, mixed   . Hypertension   . History of GI bleed   . History of peptic ulcer disease     Current Outpatient Prescriptions  Medication Sig Dispense Refill  . aspirin 81 MG tablet Take 81 mg by mouth daily.        . enalapril (VASOTEC) 10 MG tablet Take 1 tablet (10 mg total) by mouth daily.  90 tablet  3  . Multiple Vitamin (MULTIVITAMIN) tablet Take 1 tablet by mouth daily.        . nitroGLYCERIN (NITROSTAT) 0.4 MG SL tablet Place 1 tablet (0.4 mg total) under the tongue every 5 (five) minutes as needed. May repeat for up to 3 doses.  90 tablet  3  . omeprazole (PRILOSEC) 20 MG capsule TAKE 1 CAPSULE DAILY  90 capsule  0  . ondansetron (ZOFRAN ODT) 4 MG disintegrating tablet Take 1 tablet (4 mg total) by mouth every 8 (eight) hours as needed for nausea.  20 tablet  0  . simvastatin (ZOCOR) 40 MG tablet Take 1 tablet (40 mg total) by mouth at bedtime.  90 tablet  3  . Tamsulosin HCl (FLOMAX) 0.4 MG CAPS Take 1 capsule (0.4 mg total) by mouth daily.  5 capsule  0   No current facility-administered medications for this visit.    Allergies  Allergen Reactions  . Ibuprofen     Family History  Problem Relation Age of Onset  . Heart attack Mother   . Heart attack Father   . Coronary artery disease Other     History   Social History  . Marital Status: Married    Spouse Name: N/A    Number of Children: N/A  . Years of Education: N/A   Occupational History  . Retired in 2006    Social History Main Topics  . Smoking status: Former Smoker    Quit  date: 05/09/1976  . Smokeless tobacco: Not on file  . Alcohol Use: No  . Drug Use: No  . Sexually Active: Not on file   Other Topics Concern  . Not on file   Social History Narrative   Married   Disabled   Exercises regularly    ROS ALL NEGATIVE EXCEPT THOSE NOTED IN HPI  PE  General Appearance: well developed, well nourished in no acute distress HEENT: symmetrical face, PERRLA, good dentition  Neck: no JVD, thyromegaly, or adenopathy, trachea midline Chest: symmetric without deformity Cardiac: PMI non-displaced, RRR, normal S1, S2, no gallop or murmur Lung: clear to ausculation and percussion Vascular: all pulses full without bruits  Abdominal: nondistended, nontender, good bowel sounds, no HSM, no bruits Extremities: no cyanosis, clubbing or edema, no sign of DVT, no varicosities  Skin: normal color, no rashes Neuro: alert and oriented x 3, non-focal Pysch: normal affect  EKG Normal sinus rhythm, normal EKG  BMET    Component Value Date/Time   NA 137 02/03/2012 2021   K 4.1 02/03/2012 2021   CL 103 02/03/2012 2021   CO2 26 02/03/2012 2021   GLUCOSE  116* 02/03/2012 2021   GLUCOSE 89 05/17/2006 0831   BUN 24* 02/03/2012 2021   CREATININE 1.45* 02/03/2012 2021   CALCIUM 9.4 02/03/2012 2021   GFRNONAA 47* 02/03/2012 2021   GFRAA 55* 02/03/2012 2021    Lipid Panel     Component Value Date/Time   CHOL 205* 10/11/2010 0828   TRIG 166* 10/11/2010 0828   HDL 42 10/11/2010 0828   CHOLHDL 4.9 10/11/2010 0828   VLDL 33 10/11/2010 0828   LDLCALC 130* 10/11/2010 0828    CBC    Component Value Date/Time   WBC 4.2 02/03/2012 2021   RBC 4.52 02/03/2012 2021   HGB 13.9 02/03/2012 2021   HCT 41.0 02/03/2012 2021   PLT 144* 02/03/2012 2021   MCV 90.7 02/03/2012 2021   MCH 30.8 02/03/2012 2021   MCHC 33.9 02/03/2012 2021   RDW 13.2 02/03/2012 2021   LYMPHSABS 0.2* 02/03/2012 2021   MONOABS 0.1 02/03/2012 2021   EOSABS 0.0 02/03/2012 2021   BASOSABS 0.0 02/03/2012 2021

## 2012-07-19 NOTE — Assessment & Plan Note (Signed)
Stable. Continue secondary preventative therapy. Return the office in one year. 

## 2012-08-17 ENCOUNTER — Other Ambulatory Visit: Payer: Self-pay | Admitting: Cardiology

## 2012-08-21 ENCOUNTER — Ambulatory Visit: Payer: Self-pay | Admitting: Family Medicine

## 2012-08-23 ENCOUNTER — Other Ambulatory Visit: Payer: Self-pay | Admitting: Cardiology

## 2013-08-16 ENCOUNTER — Encounter: Payer: Self-pay | Admitting: Cardiology

## 2013-08-16 ENCOUNTER — Ambulatory Visit (INDEPENDENT_AMBULATORY_CARE_PROVIDER_SITE_OTHER): Payer: BC Managed Care – PPO | Admitting: Cardiology

## 2013-08-16 VITALS — BP 130/90 | HR 65 | Ht 74.0 in | Wt 195.0 lb

## 2013-08-16 DIAGNOSIS — I10 Essential (primary) hypertension: Secondary | ICD-10-CM | POA: Diagnosis not present

## 2013-08-16 NOTE — Progress Notes (Signed)
Clinical Summary [name] is a 73 y.o.male former patient of Dr Verl Blalock, this is our first visit together. He is seen for the following medical problems  1. CAD - multiple PCIs - hx of MI in 2004, with stenting to RCA (ostium down to PDA). Of note had GI bleed 2 months later on DAPT and NSAIDs requiring transfusion.  - cath from 2007 with occluded LCX OM, received a BMS. RCA stents were patent at that time. LVEF 70% by LV gram at that time, no echoes in our system  - denies any chest pain. Denies any significant SOB or DOE. No orthopnea, no PND, occas LE edema - he said he stopped his meds on his own other than ASA  2. HTN - does not check at home - stopped his bp meds  3. Hyperlipidemia - stopped statin on his own Past Medical History  Diagnosis Date  . CAD in native artery   . Bradycardia   . MI (myocardial infarction) 2004  . History of mitral valve prolapse   . Hyperlipidemia, mixed   . Hypertension   . History of GI bleed   . History of peptic ulcer disease      Allergies  Allergen Reactions  . Ibuprofen      Current Outpatient Prescriptions  Medication Sig Dispense Refill  . aspirin 81 MG tablet Take 81 mg by mouth daily.        . enalapril (VASOTEC) 10 MG tablet TAKE 1 TABLET DAILY  90 tablet  2  . Multiple Vitamin (MULTIVITAMIN) tablet Take 1 tablet by mouth daily.        . nitroGLYCERIN (NITROSTAT) 0.4 MG SL tablet Place 1 tablet (0.4 mg total) under the tongue every 5 (five) minutes as needed. May repeat for up to 3 doses.  90 tablet  3  . omeprazole (PRILOSEC) 20 MG capsule TAKE 1 CAPSULE DAILY (NEED TO CALL AND SCHEDULE AN OFFICE VISIT TO RECEIVE FURTHER REFILLS - [phone])  90 capsule  3  . ondansetron (ZOFRAN ODT) 4 MG disintegrating tablet Take 1 tablet (4 mg total) by mouth every 8 (eight) hours as needed for nausea.  20 tablet  0  . simvastatin (ZOCOR) 40 MG tablet Take 1 tablet (40 mg total) by mouth at bedtime.  90 tablet  3  . Tamsulosin HCl  (FLOMAX) 0.4 MG CAPS Take 1 capsule (0.4 mg total) by mouth daily.  5 capsule  0   No current facility-administered medications for this visit.     Past Surgical History  Procedure Laterality Date  . Coronary stent placement  2004, 2007    5 in 2004, 1 in 2007     Allergies  Allergen Reactions  . Ibuprofen       Family History  Problem Relation Age of Onset  . Heart attack Mother   . Heart attack Father   . Coronary artery disease Other      Social History [name] reports that he quit smoking about 37 years ago. He does not have any smokeless tobacco history on file. [name] reports that he does not drink alcohol.   Review of Systems CONSTITUTIONAL: No weight loss, fever, chills, weakness or fatigue.  HEENT: Eyes: No visual loss, blurred vision, double vision or yellow sclerae.No hearing loss, sneezing, congestion, runny nose or sore throat.  SKIN: No rash or itching.  CARDIOVASCULAR:  RESPIRATORY: No shortness of breath, cough or sputum.  GASTROINTESTINAL: No anorexia, nausea, vomiting or diarrhea.  No abdominal pain or blood.  GENITOURINARY: No burning on urination, no polyuria NEUROLOGICAL: No headache, dizziness, syncope, paralysis, ataxia, numbness or tingling in the extremities. No change in bowel or bladder control.  MUSCULOSKELETAL: No muscle, back pain, joint pain or stiffness.  LYMPHATICS: No enlarged nodes. No history of splenectomy.  PSYCHIATRIC: No history of depression or anxiety.  ENDOCRINOLOGIC: No reports of sweating, cold or heat intolerance. No polyuria or polydipsia.  [name]   Physical Examination p 65 bp 130/90 Wt 195 lbs BMI 25 Gen: resting comfortably, no acute distress HEENT: no scleral icterus, pupils equal round and reactive, no palptable cervical adenopathy,  CV: RRR, no m/r/g, no JVD, no carotid bruits Resp: Clear to auscultation bilaterally GI: abdomen is soft, non-tender, non-distended, normal bowel sounds, no hepatosplenomegaly MSK:  extremities are warm, no edema.  Skin: warm, no rash Neuro:  no focal deficits Psych: appropriate affect   Diagnostic Studies Cath 2007 Central aortic pressure is 101/68 with a mean of 84. LV pressure is 94/1  with an EDP of 6. There is no aortic stenosis.  Left main was normal.  LAD was a large vessel coursing to the apex. There was very prominent  severe ectasia in the proximal portion with aneurysmal dilatation and  swirling flow. However, no high grade stenosis. In the mid LAD, there was  a diffuse 40-50% segmental plaquing. In the distal LAD, there was a tubular  70-80% stenosis. There also were two diagonals. The first diagonal is a  medium vessel with a long 90% stenosis. The second diagonal was small with  branching vessel with a 70-80% proximal lesion.  The left circumflex was a moderate sized system. It gave off a large  branching OM-1 and a small OM-2. There is a 40% proximal lesion in the  circumflex. The lower branch of the OM was totally occluded and the distal  part of the vessel filled through collaterals with TIMI I flow. There is a  40% blockage in the upper portion of the OM.  The right coronary artery was a large dominant vessel. It gave off a  moderate sized PDA and two small PLs. There was evidence of previously  placed stent from the ostial portion down to the PDA. There was 40% ostial  stenosis with damping of the 6-French catheter. This did not change with  sublingual nitroglycerin. There is a 30% lesion in the proximal to mid  portion.  Left ventriculogram done the RAO position showed an EF of 70% with mild  mitral valve prolapse. There was no wall motion abnormalities and there was  no mitral regurgitation. Left ventriculogram done in the LAO position  showed an EF of 70% with no regional wall motion abnormalities.  Abdominal aortogram showed patent renal arteries bilaterally with no  abdominal aortic aneurysm.  A left subclavian done for possible bypass  surgery showed a patent LIMA to  the chest wall with no obstructions in the subclavian.  ASSESSMENT:  1. Three vessel native coronary artery disease.  2. The LAD has diffuse ectasia and swelling flow proximally as described  above. There is a borderline lesion in the distal LAD and a high grade  disease in the diagonal, but these do not appear to be the culprit  lesion.  3. Totally occluded OM1 which appears to be the culprit.  4. Patent right coronary stents.  5. Normal LV function with mild mitral valve prolapse but no significant  mitral regurgitation.  CONCLUSION: Successful PCI of a totally occluded  circumflex marginal vessel  with improvement in narrowing from 100% to 0% and improvement in flow from  TIMI 0 to TIMI III flow using a bare metal stent.     08/16/13 EKG NSR Assessment and Plan   1. CAD - no current symptoms - continue ASA  2. HTN - he stopped his ACE-I on his own, felt he didn't need it - bp today is within normal limits, will follow off therapy  3. Hyperlipidemia - he stopped his statin as well, will repeat lipid panel. Mangement pending results.    F/u 1 year   Arnoldo Lenis, M.D., F.A.C.C.

## 2013-08-16 NOTE — Patient Instructions (Signed)
Your physician wants you to follow-up in: 1 year You will receive a reminder letter in the mail two months in advance. If you don't receive a letter, please call our office to schedule the follow-up appointment.     Thank you for choosing [facility] !

## 2014-05-22 ENCOUNTER — Ambulatory Visit (INDEPENDENT_AMBULATORY_CARE_PROVIDER_SITE_OTHER): Payer: BLUE CROSS/BLUE SHIELD | Admitting: Family Medicine

## 2014-05-22 ENCOUNTER — Encounter: Payer: Self-pay | Admitting: Family Medicine

## 2014-05-22 VITALS — BP 128/84 | HR 70 | Temp 97.4°F | Ht 74.0 in | Wt 205.6 lb

## 2014-05-22 DIAGNOSIS — I10 Essential (primary) hypertension: Secondary | ICD-10-CM | POA: Insufficient documentation

## 2014-05-22 DIAGNOSIS — I2510 Atherosclerotic heart disease of native coronary artery without angina pectoris: Secondary | ICD-10-CM | POA: Diagnosis not present

## 2014-05-22 DIAGNOSIS — E785 Hyperlipidemia, unspecified: Secondary | ICD-10-CM | POA: Diagnosis not present

## 2014-05-22 MED ORDER — NITROGLYCERIN 0.4 MG SL SUBL: 0.4000 mg | SUBLINGUAL_TABLET | SUBLINGUAL | Status: DC | PRN

## 2014-05-22 MED ORDER — ATORVASTATIN CALCIUM 40 MG PO TABS: 40.0000 mg | ORAL_TABLET | Freq: Every day | ORAL | Status: DC

## 2014-05-22 NOTE — Progress Notes (Signed)
Subjective:    Patient ID: [name], male    DOB: Jul 31, 1940, 74 y.o.   MRN: [id number]  HPI Pt is here to establish care.  He has a history of hyperlipidemia, CAD and hypertension.  He is not currently taking any medications for these conditions other than Aspirin 81mg  daily.  Patient is currently off of all of his medications. He had a very significant bleed while taking Plavix and aspirin several years ago therefore he has maintained his blood thinner through 81 mg aspirin alone. He sees a cardiologist who recently retired and has only seen his new cardiologist one time. He is seeing him only annually at this time. Additionally he is due for his colonoscopy he has had multiple stents placed based on documentation within the chart and his history. Patient denies any chest pain or shortness of breath or edema. He is not following a specific diet specifically to avoid cholesterol or salt. Patient carries nitroglycerin with him. He has a full bottle in his pocket that he shows to me today. He has not taken any in several years.      Review of Systems  Constitutional: Negative for fever, chills, diaphoresis and unexpected weight change.  HENT: Negative for congestion, hearing loss, rhinorrhea, sore throat and trouble swallowing.   Gastrointestinal: Negative for nausea, vomiting, abdominal pain, diarrhea, constipation and abdominal distention.  Endocrine: Negative for cold intolerance and heat intolerance.  Genitourinary: Negative for dysuria, hematuria and flank pain.  Musculoskeletal: Negative for joint swelling and arthralgias.  Skin: Negative for rash.  Neurological: Negative for dizziness and headaches.  Psychiatric/Behavioral: Negative for dysphoric mood, decreased concentration and agitation. The patient is not nervous/anxious.               Patient Active Problem List   Diagnosis Date Noted  . CAD, NATIVE VESSEL 12/14/2009  . BRADYCARDIA 01/07/2009  .  HYPERLIPIDEMIA-MIXED 01/03/2009  . HYPERTENSION, UNSPECIFIED 01/03/2009  . PUD, HX OF 01/03/2009   Outpatient Encounter Prescriptions as of 05/22/2014  Medication Sig  . aspirin 81 MG tablet Take 81 mg by mouth daily.     Objective:   Physical Exam  Constitutional: He is oriented to person, place, and time. He appears well-developed and well-nourished. No distress.  HENT:  Head: Normocephalic and atraumatic.  Right Ear: External ear normal.  Left Ear: External ear normal.  Nose: Nose normal.  Mouth/Throat: Oropharynx is clear and moist.  Eyes: Conjunctivae and EOM are normal. Pupils are equal, round, and reactive to light.  Neck: Normal range of motion. Neck supple. No thyromegaly present.  Cardiovascular: Normal rate, regular rhythm and normal heart sounds.   No murmur heard. Pulmonary/Chest: Effort normal and breath sounds normal. No respiratory distress. He has no wheezes. He has no rales.  Abdominal: Soft. Bowel sounds are normal. He exhibits no distension. There is no tenderness.  Lymphadenopathy:    He has no cervical adenopathy.  Neurological: He is alert and oriented to person, place, and time. He has normal reflexes.  Skin: Skin is warm and dry.  Psychiatric: He has a normal mood and affect. His behavior is normal. Judgment and thought content normal.    BP 128/84 mmHg  Pulse 70  Temp(Src) 97.4 F (36.3 C) (Oral)  Ht 6\' 2"  (1.88 m)  Wt 205 lb 9.6 oz (93.26 kg)  BMI 26.39 kg/m2       Assessment & Plan:  ASCVD (arteriosclerotic cardiovascular disease)  Hyperlipidemia with target LDL less than 70  Essential  hypertension      Mediterranean Diet  Why follow it? Research shows. . Those who follow the Mediterranean diet have a reduced risk of heart disease  . The diet is associated with a reduced incidence of Parkinson's and Alzheimer's diseases . People following the diet may have longer life expectancies and lower rates of chronic diseases  . The Dietary  Guidelines for Americans recommends the Mediterranean diet as an eating plan to promote health and prevent disease  What Is the Mediterranean Diet?  . Healthy eating plan based on typical foods and recipes of Mediterranean-style cooking . The diet is primarily a plant based diet; these foods should make up a majority of meals   Starches - Plant based foods should make up a majority of meals - They are an important sources of vitamins, minerals, energy, antioxidants, and fiber - Choose whole grains, foods high in fiber and minimally processed items  - Typical grain sources include wheat, oats, barley, corn, brown rice, bulgar, farro, millet, polenta, couscous  - Various types of beans include chickpeas, lentils, fava beans, black beans, white beans   Fruits  Veggies - Large quantities of antioxidant rich fruits & veggies; 6 or more servings  - Vegetables can be eaten raw or lightly drizzled with oil and cooked  - Vegetables common to the traditional Mediterranean Diet include: artichokes, arugula, beets, broccoli, brussel sprouts, cabbage, carrots, celery, collard greens, cucumbers, eggplant, kale, leeks, lemons, lettuce, mushrooms, okra, onions, peas, peppers, potatoes, pumpkin, radishes, rutabaga, shallots, spinach, sweet potatoes, turnips, zucchini - Fruits common to the Mediterranean Diet include: apples, apricots, avocados, cherries, clementines, dates, figs, grapefruits, grapes, melons, nectarines, oranges, peaches, pears, pomegranates, strawberries, tangerines  Fats - Replace butter and margarine with healthy oils, such as olive oil, canola oil, and tahini  - Limit nuts to no more than a handful a day  - Nuts include walnuts, almonds, pecans, pistachios, pine nuts  - Limit or avoid candied, honey roasted or heavily salted nuts - Olives are central to the [employer] - can be eaten whole or used in a variety of dishes   Meats Protein - Limiting red meat: no more than a few times a  month - When eating red meat: choose lean cuts and keep the portion to the size of deck of cards - Eggs: approx. 0 to 4 times a week  - Fish and lean poultry: at least 2 a week  - Healthy protein sources include, chicken, [place], lean beef, lamb - Increase intake of seafood such as tuna, salmon, trout, mackerel, shrimp, scallops - Avoid or limit high fat processed meats such as sausage and bacon  Dairy - Include moderate amounts of low fat dairy products  - Focus on healthy dairy such as fat free yogurt, skim milk, low or reduced fat cheese - Limit dairy products higher in fat such as whole or 2% milk, cheese, ice cream  Alcohol - Moderate amounts of red wine is ok  - No more than 5 oz daily for women (all ages) and men older than age 43  - No more than 10 oz of wine daily for men younger than 28  Other - Limit sweets and other desserts  - Use herbs and spices instead of salt to flavor foods  - Herbs and spices common to the traditional Mediterranean Diet include: basil, bay leaves, chives, cloves, cumin, fennel, garlic, lavender, marjoram, mint, oregano, parsley, pepper, rosemary, sage, savory, sumac, tarragon, thyme   It's not just a  diet, it's a lifestyle:  . The Mediterranean diet includes lifestyle factors typical of those in the region  . Foods, drinks and meals are best eaten with others and savored . Daily physical activity is important for overall good health . This could be strenuous exercise like running and aerobics . This could also be more leisurely activities such as walking, housework, yard-work, or taking the stairs . Moderation is the key; a balanced and healthy diet accommodates most foods and drinks . Consider portion sizes and frequency of consumption of certain foods   Meal Ideas & Options:  . Breakfast:  o Whole wheat toast or whole wheat English muffins with peanut butter & hard boiled egg o Steel cut oats topped with apples & cinnamon and skim milk  o Fresh  fruit: banana, strawberries, melon, berries, peaches  o Smoothies: strawberries, bananas, greek yogurt, peanut butter o Low fat greek yogurt with blueberries and granola  o Egg white omelet with spinach and mushrooms o Breakfast couscous: whole wheat couscous, apricots, skim milk, cranberries  . Sandwiches:  o Hummus and grilled vegetables (peppers, zucchini, squash) on whole wheat bread   o Grilled chicken on whole wheat pita with lettuce, tomatoes, cucumbers or tzatziki  o Tuna salad on whole wheat bread: tuna salad made with greek yogurt, olives, red peppers, capers, green onions o Garlic rosemary lamb pita: lamb sauted with garlic, rosemary, salt & pepper; add lettuce, cucumber, greek yogurt to pita - flavor with lemon juice and black pepper  . Seafood:  o Mediterranean grilled salmon, seasoned with garlic, basil, parsley, lemon juice and black pepper o Shrimp, lemon, and spinach whole-grain pasta salad made with low fat greek yogurt  o Seared scallops with lemon orzo  o Seared tuna steaks seasoned salt, pepper, coriander topped with tomato mixture of olives, tomatoes, olive oil, minced garlic, parsley, green onions and cappers  . Meats:  o Herbed greek chicken salad with kalamata olives, cucumber, feta  o Red bell peppers stuffed with spinach, bulgur, lean ground beef (or lentils) & topped with feta   o Kebabs: skewers of chicken, tomatoes, onions, zucchini, squash  o [place] burgers: made with red onions, mint, dill, lemon juice, feta cheese topped with roasted red peppers . Vegetarian o Cucumber salad: cucumbers, artichoke hearts, celery, red onion, feta cheese, tossed in olive oil & lemon juice  o Hummus and whole grain pita points with a greek salad (lettuce, tomato, feta, olives, cucumbers, red onion) o Lentil soup with celery, carrots made with vegetable broth, garlic, salt and pepper  o Tabouli salad: parsley, bulgur, mint, scallions, cucumbers, tomato, radishes, lemon juice,  olive oil, salt and pepper.

## 2014-05-22 NOTE — Patient Instructions (Signed)
Keep 5 nitroglycerin pills in the bottle in your pocket and replace them every week with a fresh supply. Keep your backup supply in the refrigerator. Take atorvastatin daily for cholesterol Drop by 3 days before your next appointment to have your cholesterol level drawn. Be sure that you have had nothing to eat or drink for at least 8 hours prior to coming in for that lab work. The one exception is you can have all the water or black coffee you want that day. You can resume her normal diet as soon as the blood is drawn.

## 2014-06-13 ENCOUNTER — Other Ambulatory Visit (INDEPENDENT_AMBULATORY_CARE_PROVIDER_SITE_OTHER): Payer: BLUE CROSS/BLUE SHIELD

## 2014-06-13 DIAGNOSIS — Z139 Encounter for screening, unspecified: Secondary | ICD-10-CM

## 2014-06-13 DIAGNOSIS — I10 Essential (primary) hypertension: Secondary | ICD-10-CM

## 2014-06-13 DIAGNOSIS — I2510 Atherosclerotic heart disease of native coronary artery without angina pectoris: Secondary | ICD-10-CM

## 2014-06-13 DIAGNOSIS — E785 Hyperlipidemia, unspecified: Secondary | ICD-10-CM

## 2014-06-13 LAB — POCT CBC
Granulocyte percent: 70 %G (ref 37–80)
HEMATOCRIT: 47.4 % (ref 43.5–53.7)
Hemoglobin: 15.4 g/dL (ref 14.1–18.1)
LYMPH, POC: 1.6 (ref 0.6–3.4)
MCH, POC: 28.9 pg (ref 27–31.2)
MCHC: 32.5 g/dL (ref 31.8–35.4)
MCV: 89 fL (ref 80–97)
MPV: 9.6 fL (ref 0–99.8)
PLATELET COUNT, POC: 242 10*3/uL (ref 142–424)
POC Granulocyte: 4.5 (ref 2–6.9)
POC LYMPH %: 25.7 % (ref 10–50)
RBC: 5.3 M/uL (ref 4.69–6.13)
RDW, POC: 13.6 %
WBC: 6.4 10*3/uL (ref 4.6–10.2)

## 2014-06-13 NOTE — Progress Notes (Signed)
Lab only 

## 2014-06-16 LAB — THYROID PANEL WITH TSH
Free Thyroxine Index: 2.3 (ref 1.2–4.9)
T3 UPTAKE RATIO: 25 % (ref 24–39)
T4, Total: 9 ug/dL (ref 4.5–12.0)
TSH: 4.42 u[IU]/mL (ref 0.450–4.500)

## 2014-06-16 LAB — PSA, TOTAL AND FREE
PSA, Free Pct: 40.8 %
PSA, Free: 0.49 ng/mL
PSA: 1.2 ng/mL (ref 0.0–4.0)

## 2014-06-16 LAB — NMR, LIPOPROFILE
Cholesterol: 190 mg/dL (ref 100–199)
HDL Cholesterol by NMR: 47 mg/dL (ref >39)
HDL PARTICLE NUMBER: 26.7 umol/L — AB (ref >=30.5)
LDL Particle Number: 1370 nmol/L — ABNORMAL HIGH (ref <1000)
LDL Size: 21.8 nm (ref >20.5)
LDL-C: 119 mg/dL — ABNORMAL HIGH (ref 0–99)
LP-IR Score: 31 (ref <=45)
SMALL LDL PARTICLE NUMBER: 321 nmol/L (ref <=527)
Triglycerides by NMR: 119 mg/dL (ref 0–149)

## 2014-06-16 LAB — CMP14+EGFR
ALT: 16 IU/L (ref 0–44)
AST: 15 IU/L (ref 0–40)
Albumin/Globulin Ratio: 1.6 (ref 1.1–2.5)
Albumin: 4.2 g/dL (ref 3.5–4.8)
Alkaline Phosphatase: 180 IU/L — ABNORMAL HIGH (ref 39–117)
BUN/Creatinine Ratio: 16 (ref 10–22)
BUN: 19 mg/dL (ref 8–27)
Bilirubin Total: 0.7 mg/dL (ref 0.0–1.2)
CO2: 28 mmol/L (ref 18–29)
Calcium: 9.2 mg/dL (ref 8.6–10.2)
Chloride: 105 mmol/L (ref 97–108)
Creatinine, Ser: 1.18 mg/dL (ref 0.76–1.27)
GFR calc Af Amer: 71 mL/min/{1.73_m2} (ref >59)
GFR calc non Af Amer: 61 mL/min/{1.73_m2} (ref >59)
Globulin, Total: 2.6 g/dL (ref 1.5–4.5)
Glucose: 100 mg/dL — ABNORMAL HIGH (ref 65–99)
POTASSIUM: 4.1 mmol/L (ref 3.5–5.2)
Sodium: 144 mmol/L (ref 134–144)
Total Protein: 6.8 g/dL (ref 6.0–8.5)

## 2014-06-16 LAB — VITAMIN D 25 HYDROXY (VIT D DEFICIENCY, FRACTURES): Vit D, 25-Hydroxy: 27.6 ng/mL — ABNORMAL LOW (ref 30.0–100.0)

## 2014-06-24 ENCOUNTER — Encounter: Payer: Self-pay | Admitting: Family Medicine

## 2014-06-24 ENCOUNTER — Ambulatory Visit (INDEPENDENT_AMBULATORY_CARE_PROVIDER_SITE_OTHER): Payer: BLUE CROSS/BLUE SHIELD | Admitting: Family Medicine

## 2014-06-24 VITALS — BP 131/84 | HR 57 | Temp 97.9°F | Ht 74.0 in | Wt 205.0 lb

## 2014-06-24 DIAGNOSIS — I209 Angina pectoris, unspecified: Secondary | ICD-10-CM | POA: Diagnosis not present

## 2014-06-24 DIAGNOSIS — I2510 Atherosclerotic heart disease of native coronary artery without angina pectoris: Secondary | ICD-10-CM

## 2014-06-24 MED ORDER — METOPROLOL SUCCINATE ER 50 MG PO TB24: 50.0000 mg | ORAL_TABLET | Freq: Every day | ORAL | Status: DC

## 2014-06-24 MED ORDER — ATORVASTATIN CALCIUM 40 MG PO TABS: 40.0000 mg | ORAL_TABLET | Freq: Every day | ORAL | Status: DC

## 2014-06-24 NOTE — Progress Notes (Signed)
Subjective:  Patient ID: YADIEL AUBRY, male    DOB: October 09, 1940  Age: 74 y.o. MRN: [id number]  CC: ASCVD and Hyperlipidemia   HPI KEBRON PULSE presents for patient reports some exertional chest pain describes an incident a few weeks ago where he was collecting cans with his sister. He got out of the [place] to collect some cans with her walker short ways and started feeling a chest pain he points to the left side of his chest. It really grabbed him he says. He is stopped and got back in the [place] and the pain settled down and went away within just a few minutes. He was no radiation. This is an example of exertional chest pain that he says is happening more frequently.  Patient has started taking the atorvastatin he brings the medicine with him today and says that it is not causing any muscle aches or joint pains.   Patient came back in for blood work due to recent start of medication. Vitamin D level was also drawn. This was found to be quite low patient would like to initiate treatment.  History Ugonna has a past medical history of CAD in native artery; Bradycardia; MI (myocardial infarction) (2004); History of mitral valve prolapse; Hyperlipidemia, mixed; Hypertension; History of GI bleed; and History of peptic ulcer disease.   He has past surgical history that includes Coronary stent placement (2004, 2007).   His family history includes Coronary artery disease in his other; Heart attack in his father and mother.He reports that he quit smoking about 38 years ago. He does not have any smokeless tobacco history on file. He reports that he does not drink alcohol or use illicit drugs.  Current Outpatient Prescriptions on File Prior to Visit  Medication Sig Dispense Refill  . aspirin 81 MG tablet Take 81 mg by mouth daily.      . nitroGLYCERIN (NITROSTAT) 0.4 MG SL tablet Place 1 tablet (0.4 mg total) under the tongue every 5 (five) minutes as needed for chest pain. 50 tablet 3   No current  facility-administered medications on file prior to visit.    ROS Review of Systems  Constitutional: Negative for fever, chills, diaphoresis and unexpected weight change.  HENT: Negative for congestion, hearing loss, rhinorrhea, sore throat and trouble swallowing.   Respiratory: Negative for cough, chest tightness, shortness of breath and wheezing.   Cardiovascular: Positive for chest pain. Negative for palpitations and leg swelling.       See history of present illness  Gastrointestinal: Negative for nausea, vomiting, abdominal pain, diarrhea, constipation and abdominal distention.  Endocrine: Negative for cold intolerance and heat intolerance.  Genitourinary: Negative for dysuria, hematuria and flank pain.  Musculoskeletal: Negative for joint swelling and arthralgias.  Skin: Negative for rash.  Neurological: Negative for dizziness and headaches.  Psychiatric/Behavioral: Negative for dysphoric mood, decreased concentration and agitation. The patient is not nervous/anxious.     Objective:  BP 131/84 mmHg  Pulse 57  Temp(Src) 97.9 F (36.6 C) (Oral)  Ht '6\' 2"'  (1.88 m)  Wt 205 lb (92.987 kg)  BMI 26.31 kg/m2  BP Readings from Last 3 Encounters:  06/24/14 131/84  05/22/14 128/84  08/16/13 130/90    Wt Readings from Last 3 Encounters:  06/24/14 205 lb (92.987 kg)  05/22/14 205 lb 9.6 oz (93.26 kg)  08/16/13 195 lb (88.451 kg)     Physical Exam  Constitutional: He is oriented to person, place, and time. He appears well-developed and well-nourished. No distress.  HENT:  Head: Normocephalic and atraumatic.  Right Ear: External ear normal.  Left Ear: External ear normal.  Nose: Nose normal.  Mouth/Throat: Oropharynx is clear and moist.  Eyes: Conjunctivae and EOM are normal. Pupils are equal, round, and reactive to light.  Neck: Normal range of motion. Neck supple. No thyromegaly present.  Cardiovascular: Normal rate, regular rhythm and normal heart sounds.   No murmur  heard. Pulmonary/Chest: Effort normal and breath sounds normal. No respiratory distress. He has no wheezes. He has no rales.  Abdominal: Soft. Bowel sounds are normal. He exhibits no distension. There is no tenderness.  Lymphadenopathy:    He has no cervical adenopathy.  Neurological: He is alert and oriented to person, place, and time. He has normal reflexes.  Skin: Skin is warm and dry.  Psychiatric: He has a normal mood and affect. His behavior is normal. Judgment and thought content normal.    No results found for: HGBA1C  Lab Results  Component Value Date   WBC 6.4 06/13/2014   HGB 15.4 06/13/2014   HCT 47.4 06/13/2014   PLT 144* 02/03/2012   GLUCOSE 100* 06/13/2014   CHOL 190 06/13/2014   TRIG 119 06/13/2014   HDL 47 06/13/2014   LDLDIRECT 142.8 01/28/2008   LDLCALC 130* 10/11/2010   ALT 16 06/13/2014   AST 15 06/13/2014   NA 144 06/13/2014   K 4.1 06/13/2014   CL 105 06/13/2014   CREATININE 1.18 06/13/2014   BUN 19 06/13/2014   CO2 28 06/13/2014   TSH 4.420 06/13/2014   PSA 1.2 06/13/2014    Nm Bone Scan Whole Body  06/15/2012   *RADIOLOGY REPORT*  Clinical Data: Elevated alkaline phosphatase  NUCLEAR MEDICINE WHOLE BODY BONE SCINTIGRAPHY  Technique:  Whole body anterior and posterior images were obtained approximately 3 hours after intravenous injection of radiopharmaceutical.  Radiopharmaceutical: 25MILLI CURIE TC-MDP TECHNETIUM TC 7M MEDRONATE IV KIT  Comparison: None Radiographic correlation:  CT abdomen pelvis 02/03/2012  Findings: Minimal uptake at bilateral AC joints, typically degenerative. Minimal asymmetry of uptake at the right lateral aspect of the lower lumbar spine at L4-L5 corresponding to degenerative disc disease changes on prior CT. No other abnormal sites of osseous tracer accumulation identified. Expected urinary tract and soft tissue distribution of tracer.  IMPRESSION: No significant scintigraphic abnormalities identified.   Original Report  Authenticated By: Lavonia Dana, M.D.    Assessment & Plan:   Escher was seen today for ascvd and hyperlipidemia.  Diagnoses and all orders for this visit:  Angina pectoris  Other orders -     atorvastatin (LIPITOR) 40 MG tablet; Take 1 tablet (40 mg total) by mouth daily. -     metoprolol succinate (TOPROL-XL) 50 MG 24 hr tablet; Take 1 tablet (50 mg total) by mouth daily. Take with or immediately following a meal.  I am having Mr. Faciane start on metoprolol succinate. I am also having him maintain his aspirin, nitroGLYCERIN, and atorvastatin.  Meds ordered this encounter  Medications  . atorvastatin (LIPITOR) 40 MG tablet    Sig: Take 1 tablet (40 mg total) by mouth daily.    Dispense:  90 tablet    Refill:  4  . metoprolol succinate (TOPROL-XL) 50 MG 24 hr tablet    Sig: Take 1 tablet (50 mg total) by mouth daily. Take with or immediately following a meal.    Dispense:  90 tablet    Refill:  3    Comments: Pt. Advised to rotate his nitro tabs  Follow-up: Return in about 1 month (around 07/23/2014) for angina.  Claretta Fraise, M.D.

## 2014-06-24 NOTE — Patient Instructions (Signed)
Vitamin D quite low. Needs supplement.Use 50,000 units twice weekly for two months. Then switch to OTC Vitamin D 2000 units daily. REcehck vitamin D level with office visit in 6 months.   Keep for 5 nitroglycerin tablets in your pocket in the [place] bottle. Keep the rest of your supply in the refrigerator. Every week U should replace any unused tablets that you've been caring in your pocket with fresh ones from the refrigerator and discard the old supply. This is because nitroglycerin tends to deteriorate when exposed to heat such as her body heat from being in your pocket.

## 2014-06-30 ENCOUNTER — Other Ambulatory Visit: Payer: Self-pay | Admitting: *Deleted

## 2014-06-30 ENCOUNTER — Telehealth: Payer: Self-pay | Admitting: *Deleted

## 2014-06-30 MED ORDER — VITAMIN D (ERGOCALCIFEROL) 1.25 MG (50000 UNIT) PO CAPS: ORAL_CAPSULE | ORAL | Status: DC

## 2014-07-23 ENCOUNTER — Ambulatory Visit (INDEPENDENT_AMBULATORY_CARE_PROVIDER_SITE_OTHER): Payer: BLUE CROSS/BLUE SHIELD | Admitting: Family Medicine

## 2014-07-23 ENCOUNTER — Encounter: Payer: Self-pay | Admitting: Family Medicine

## 2014-07-23 VITALS — BP 130/80 | HR 53 | Temp 97.1°F | Ht 74.0 in | Wt 202.6 lb

## 2014-07-23 DIAGNOSIS — Z23 Encounter for immunization: Secondary | ICD-10-CM

## 2014-07-23 DIAGNOSIS — I10 Essential (primary) hypertension: Secondary | ICD-10-CM

## 2014-07-23 DIAGNOSIS — I2510 Atherosclerotic heart disease of native coronary artery without angina pectoris: Secondary | ICD-10-CM

## 2014-07-23 MED ORDER — DOXYCYCLINE HYCLATE 100 MG PO TABS: 100.0000 mg | ORAL_TABLET | Freq: Two times a day (BID) | ORAL | Status: DC

## 2014-07-23 NOTE — Progress Notes (Signed)
Subjective:  Patient ID: [name], male    DOB: 1940-05-13  Age: 74 y.o. MRN: [id number]  CC: Hypertension   HPI ENES ROKOSZ presents for follow-up of hypertension. Patient has no history of headache chest pain or shortness of breath or recent cough. Patient also denies symptoms of TIA such as numbness weakness lateralizing. Patient checks  blood pressure at home and has not had any elevated readings recently. Patient denies side effects from his medication. States taking it regularly.   History Huntley has a past medical history of CAD in native artery; Bradycardia; MI (myocardial infarction) (2004); History of mitral valve prolapse; Hyperlipidemia, mixed; Hypertension; History of GI bleed; and History of peptic ulcer disease.   He has past surgical history that includes Coronary stent placement (2004, 2007).   His family history includes Coronary artery disease in his other; Heart attack in his father and mother.He reports that he quit smoking about 38 years ago. He does not have any smokeless tobacco history on file. He reports that he does not drink alcohol or use illicit drugs.  Current Outpatient Prescriptions on File Prior to Visit  Medication Sig Dispense Refill  . aspirin 81 MG tablet Take 81 mg by mouth daily.      [name] atorvastatin (LIPITOR) 40 MG tablet Take 1 tablet (40 mg total) by mouth daily. 90 tablet 4  . metoprolol succinate (TOPROL-XL) 50 MG 24 hr tablet Take 1 tablet (50 mg total) by mouth daily. Take with or immediately following a meal. 90 tablet 3  . nitroGLYCERIN (NITROSTAT) 0.4 MG SL tablet Place 1 tablet (0.4 mg total) under the tongue every 5 (five) minutes as needed for chest pain. 50 tablet 3  . Vitamin D, Ergocalciferol, (DRISDOL) 50000 UNITS CAPS capsule Take 1 po twice a week x 2 months 16 capsule 0   No current facility-administered medications on file prior to visit.    ROS Review of Systems  Constitutional: Negative for fever, chills, diaphoresis  and unexpected weight change.  HENT: Negative for congestion, hearing loss, rhinorrhea, sore throat and trouble swallowing.   Respiratory: Negative for cough, chest tightness, shortness of breath and wheezing.   Gastrointestinal: Negative for nausea, vomiting, abdominal pain, diarrhea, constipation and abdominal distention.  Endocrine: Negative for cold intolerance and heat intolerance.  Genitourinary: Negative for dysuria, hematuria and flank pain.  Musculoskeletal: Negative for joint swelling and arthralgias.  Skin: Negative for rash.  Neurological: Negative for dizziness and headaches.  Psychiatric/Behavioral: Negative for dysphoric mood, decreased concentration and agitation. The patient is not nervous/anxious.     Objective:  BP 130/80 mmHg  Pulse 53  Temp(Src) 97.1 F (36.2 C) (Oral)  Ht _0  (1.88 m)  Wt 202 lb 9.6 oz (91.899 kg)  BMI 26.00 kg/m2  BP Readings from Last 3 Encounters:  07/23/14 130/80  06/24/14 131/84  05/22/14 128/84    Wt Readings from Last 3 Encounters:  07/23/14 202 lb 9.6 oz (91.899 kg)  06/24/14 205 lb (92.987 kg)  05/22/14 205 lb 9.6 oz (93.26 kg)     Physical Exam  Constitutional: He is oriented to person, place, and time. He appears well-developed and well-nourished. No distress.  HENT:  Head: Normocephalic and atraumatic.  Right Ear: External ear normal.  Left Ear: External ear normal.  Nose: Nose normal.  Mouth/Throat: Oropharynx is clear and moist.  Eyes: Conjunctivae and EOM are normal. Pupils are equal, round, and reactive to light.  Neck: Normal range of motion. Neck supple. No  thyromegaly present.  Cardiovascular: Normal rate, regular rhythm and normal heart sounds.   No murmur heard. Pulmonary/Chest: Effort normal and breath sounds normal. No respiratory distress. He has no wheezes. He has no rales.  Abdominal: Soft. Bowel sounds are normal. He exhibits no distension. There is no tenderness.  Lymphadenopathy:    He has no  cervical adenopathy.  Neurological: He is alert and oriented to person, place, and time. He has normal reflexes.  Skin: Skin is warm and dry.  Psychiatric: He has a normal mood and affect. His behavior is normal. Judgment and thought content normal.    No results found for: HGBA1C  Lab Results  Component Value Date   WBC 6.4 06/13/2014   HGB 15.4 06/13/2014   HCT 47.4 06/13/2014   PLT 144* 02/03/2012   GLUCOSE 100* 06/13/2014   CHOL 190 06/13/2014   TRIG 119 06/13/2014   HDL 47 06/13/2014   LDLDIRECT 142.8 01/28/2008   LDLCALC 130* 10/11/2010   ALT 16 06/13/2014   AST 15 06/13/2014   NA 144 06/13/2014   K 4.1 06/13/2014   CL 105 06/13/2014   CREATININE 1.18 06/13/2014   BUN 19 06/13/2014   CO2 28 06/13/2014   TSH 4.420 06/13/2014   PSA 1.2 06/13/2014    Nm Bone Scan Whole Body  06/15/2012   *RADIOLOGY REPORT*  Clinical Data: Elevated alkaline phosphatase  NUCLEAR MEDICINE WHOLE BODY BONE SCINTIGRAPHY  Technique:  Whole body anterior and posterior images were obtained approximately 3 hours after intravenous injection of radiopharmaceutical.  Radiopharmaceutical: 25MILLI CURIE TC-MDP TECHNETIUM TC 31M MEDRONATE IV KIT  Comparison: None Radiographic correlation:  CT abdomen pelvis 02/03/2012  Findings: Minimal uptake at bilateral AC joints, typically degenerative. Minimal asymmetry of uptake at the right lateral aspect of the lower lumbar spine at L4-L5 corresponding to degenerative disc disease changes on prior CT. No other abnormal sites of osseous tracer accumulation identified. Expected urinary tract and soft tissue distribution of tracer.  IMPRESSION: No significant scintigraphic abnormalities identified.   Original Report Authenticated By: Lavonia Dana, M.D.    Assessment & Plan:   Eryx was seen today for hypertension.  Diagnoses and all orders for this visit:  Essential hypertension  Other orders -     Discontinue: doxycycline (VIBRA-TABS) 100 MG tablet; Take 1 tablet  (100 mg total) by mouth 2 (two) times daily. -     Pneumococcal conjugate vaccine 13-valent  I have discontinued [name] doxycycline. I am also having him maintain his aspirin, nitroGLYCERIN, atorvastatin, metoprolol succinate, and Vitamin D (Ergocalciferol).  Meds ordered this encounter  Medications  . DISCONTD: doxycycline (VIBRA-TABS) 100 MG tablet    Sig: Take 1 tablet (100 mg total) by mouth 2 (two) times daily.    Dispense:  20 tablet    Refill:  0     Follow-up: Return in about 6 months (around 01/23/2015).  Claretta Fraise, M.D.

## 2014-07-23 NOTE — Patient Instructions (Signed)
Consider adding a B complex vitamin to your daily regimen. That may help with overall energy.

## 2014-08-18 ENCOUNTER — Other Ambulatory Visit: Payer: Self-pay | Admitting: *Deleted

## 2014-08-19 ENCOUNTER — Encounter: Payer: Medicare Other | Admitting: Cardiology

## 2014-08-19 NOTE — Progress Notes (Signed)
Clinical Summary [name] is a 74 y.o.male seen today for follow up of the following medical problems.   1. CAD - multiple PCIs - hx of MI in 2004, with stenting to RCA (ostium down to PDA). Of note had GI bleed 2 months later on DAPT and NSAIDs requiring transfusion.  - cath from 2007 with occluded LCX OM, received a BMS. RCA stents were patent at that time. LVEF 70% by LV gram at that time, no echoes in our system  - denies any chest pain. Denies any significant SOB or DOE. No orthopnea, no PND, occas LE edema - he said he stopped his meds on his own other than ASA  2. HTN - does not check at home - stopped his bp meds  3. Hyperlipidemia - stopped statin on his own Past Medical History  Diagnosis Date  . CAD in native artery   . Bradycardia   . MI (myocardial infarction) 2004  . History of mitral valve prolapse   . Hyperlipidemia, mixed   . Hypertension   . History of GI bleed   . History of peptic ulcer disease      Allergies  Allergen Reactions  . Ibuprofen      Current Outpatient Prescriptions  Medication Sig Dispense Refill  . aspirin 81 MG tablet Take 81 mg by mouth daily.      [name] atorvastatin (LIPITOR) 40 MG tablet Take 1 tablet (40 mg total) by mouth daily. 90 tablet 4  . metoprolol succinate (TOPROL-XL) 50 MG 24 hr tablet Take 1 tablet (50 mg total) by mouth daily. Take with or immediately following a meal. 90 tablet 3  . nitroGLYCERIN (NITROSTAT) 0.4 MG SL tablet Place 1 tablet (0.4 mg total) under the tongue every 5 (five) minutes as needed for chest pain. 50 tablet 3  . Vitamin D, Ergocalciferol, (DRISDOL) 50000 UNITS CAPS capsule Take 1 po twice a week x 2 months 16 capsule 0   No current facility-administered medications for this visit.     Past Surgical History  Procedure Laterality Date  . Coronary stent placement  2004, 2007    5 in 2004, 1 in 2007     Allergies  Allergen Reactions  . Ibuprofen       Family History  Problem  Relation Age of Onset  . Heart attack Mother   . Heart attack Father   . Coronary artery disease Other      Social History [name] reports that he quit smoking about 38 years ago. He does not have any smokeless tobacco history on file. [name] reports that he does not drink alcohol.   Review of Systems CONSTITUTIONAL: No weight loss, fever, chills, weakness or fatigue.  HEENT: Eyes: No visual loss, blurred vision, double vision or yellow sclerae.No hearing loss, sneezing, congestion, runny nose or sore throat.  SKIN: No rash or itching.  CARDIOVASCULAR:  RESPIRATORY: No shortness of breath, cough or sputum.  GASTROINTESTINAL: No anorexia, nausea, vomiting or diarrhea. No abdominal pain or blood.  GENITOURINARY: No burning on urination, no polyuria NEUROLOGICAL: No headache, dizziness, syncope, paralysis, ataxia, numbness or tingling in the extremities. No change in bowel or bladder control.  MUSCULOSKELETAL: No muscle, back pain, joint pain or stiffness.  LYMPHATICS: No enlarged nodes. No history of splenectomy.  PSYCHIATRIC: No history of depression or anxiety.  ENDOCRINOLOGIC: No reports of sweating, cold or heat intolerance. No polyuria or polydipsia.  [name]   Physical Examination There were no vitals  filed for this visit. There were no vitals filed for this visit.  Gen: resting comfortably, no acute distress HEENT: no scleral icterus, pupils equal round and reactive, no palptable cervical adenopathy,  CV Resp: Clear to auscultation bilaterally GI: abdomen is soft, non-tender, non-distended, normal bowel sounds, no hepatosplenomegaly MSK: extremities are warm, no edema.  Skin: warm, no rash Neuro:  no focal deficits Psych: appropriate affect   Diagnostic Studies Cath 2007 Central aortic pressure is 101/68 with a mean of 84. LV pressure is 94/1  with an EDP of 6. There is no aortic stenosis.  Left main was normal.  LAD was a large vessel coursing to the apex.  There was very prominent  severe ectasia in the proximal portion with aneurysmal dilatation and  swirling flow. However, no high grade stenosis. In the mid LAD, there was  a diffuse 40-50% segmental plaquing. In the distal LAD, there was a tubular  70-80% stenosis. There also were two diagonals. The first diagonal is a  medium vessel with a long 90% stenosis. The second diagonal was small with  branching vessel with a 70-80% proximal lesion.  The left circumflex was a moderate sized system. It gave off a large  branching OM-1 and a small OM-2. There is a 40% proximal lesion in the  circumflex. The lower [name] of the OM was totally occluded and the distal  part of the vessel filled through collaterals with TIMI I flow. There is a  40% blockage in the upper portion of the OM.  The right coronary artery was a large dominant vessel. It gave off a  moderate sized PDA and two small PLs. There was evidence of previously  placed stent from the ostial portion down to the PDA. There was 40% ostial  stenosis with damping of the 6-French catheter. This did not change with  sublingual nitroglycerin. There is a 30% lesion in the proximal to mid  portion.  Left ventriculogram done the RAO position showed an EF of 70% with mild  mitral valve prolapse. There was no wall motion abnormalities and there was  no mitral regurgitation. Left ventriculogram done in the LAO position  showed an EF of 70% with no regional wall motion abnormalities.  Abdominal aortogram showed patent renal arteries bilaterally with no  abdominal aortic aneurysm.  A left subclavian done for possible bypass surgery showed a patent LIMA to  the chest wall with no obstructions in the subclavian.  ASSESSMENT:  1. Three vessel native coronary artery disease.  2. The LAD has diffuse ectasia and swelling flow proximally as described  above. There is a borderline lesion in the distal LAD and a high grade   disease in the diagonal, but these do not appear to be the culprit  lesion.  3. Totally occluded OM1 which appears to be the culprit.  4. Patent right coronary stents.  5. Normal LV function with mild mitral valve prolapse but no significant  mitral regurgitation.  CONCLUSION: Successful PCI of a totally occluded circumflex marginal vessel  with improvement in narrowing from 100% to 0% and improvement in flow from  TIMI 0 to TIMI III flow using a bare metal stent.        Assessment and Plan   1. CAD - no current symptoms - continue ASA  2. HTN - he stopped his ACE-I on his own, felt he didn't need it - bp today is within normal limits, will follow off therapy  3. Hyperlipidemia - he stopped his  statin as well, will repeat lipid panel. Mangement pending results.        Arnoldo Lenis, M.D., F.A.C.C.

## 2014-08-21 ENCOUNTER — Other Ambulatory Visit: Payer: Self-pay

## 2014-08-21 MED ORDER — VITAMIN D (ERGOCALCIFEROL) 1.25 MG (50000 UNIT) PO CAPS: ORAL_CAPSULE | ORAL | Status: DC

## 2014-08-26 ENCOUNTER — Encounter: Payer: Self-pay | Admitting: Cardiology

## 2014-08-26 ENCOUNTER — Ambulatory Visit (INDEPENDENT_AMBULATORY_CARE_PROVIDER_SITE_OTHER): Payer: BLUE CROSS/BLUE SHIELD | Admitting: Cardiology

## 2014-08-26 VITALS — BP 116/60 | HR 55 | Ht 74.0 in | Wt 204.0 lb

## 2014-08-26 DIAGNOSIS — I2510 Atherosclerotic heart disease of native coronary artery without angina pectoris: Secondary | ICD-10-CM

## 2014-08-26 DIAGNOSIS — E785 Hyperlipidemia, unspecified: Secondary | ICD-10-CM

## 2014-08-26 DIAGNOSIS — R001 Bradycardia, unspecified: Secondary | ICD-10-CM

## 2014-08-26 DIAGNOSIS — I10 Essential (primary) hypertension: Secondary | ICD-10-CM

## 2014-08-26 MED ORDER — METOPROLOL SUCCINATE ER 25 MG PO TB24: 25.0000 mg | ORAL_TABLET | Freq: Every day | ORAL | Status: DC

## 2014-08-26 MED ORDER — ATORVASTATIN CALCIUM 80 MG PO TABS: 80.0000 mg | ORAL_TABLET | Freq: Every day | ORAL | Status: DC

## 2014-08-26 NOTE — Progress Notes (Signed)
Clinical Summary Mr. Mcqueary is a 74 y.o.male seen today for follow up of the following medical problems.   1. CAD - multiple PCIs in the past - hx of MI in 2004, with stenting to RCA (ostium down to PDA). Of note had GI bleed 2 months later on DAPT and NSAIDs requiring transfusion.  - cath from 2007 with occluded LCX OM, received a BMS. RCA stents were patent at that time. LVEF 70% by LV gram at that time, no echoes in our system  - denies any chest pain recently. Denies any significant SOB or DOE. No orthopnea, no PND, or LE edema - compliant with meds. Notes some occasional orthostatic symptoms.   2. HTN - does not check at home - compliant with meds  3. Hyperlipidemia - 06/2014 LDL 119 HDL 47 TG 119 - has had mixed compliance with statin Past Medical History  Diagnosis Date  . CAD in native artery   . Bradycardia   . MI (myocardial infarction) 2004  . History of mitral valve prolapse   . Hyperlipidemia, mixed   . Hypertension   . History of GI bleed   . History of peptic ulcer disease      Allergies  Allergen Reactions  . Ibuprofen      Current Outpatient Prescriptions  Medication Sig Dispense Refill  . aspirin 81 MG tablet Take 81 mg by mouth daily.      [name] atorvastatin (LIPITOR) 40 MG tablet Take 1 tablet (40 mg total) by mouth daily. 90 tablet 4  . metoprolol succinate (TOPROL-XL) 50 MG 24 hr tablet Take 1 tablet (50 mg total) by mouth daily. Take with or immediately following a meal. 90 tablet 3  . nitroGLYCERIN (NITROSTAT) 0.4 MG SL tablet Place 1 tablet (0.4 mg total) under the tongue every 5 (five) minutes as needed for chest pain. 50 tablet 3  . Vitamin D, Ergocalciferol, (DRISDOL) 50000 UNITS CAPS capsule Take 1 po twice a week x 2 months 24 capsule 0   No current facility-administered medications for this visit.     Past Surgical History  Procedure Laterality Date  . Coronary stent placement  2004, 2007    5 in 2004, 1 in 2007     Allergies    Allergen Reactions  . Ibuprofen       Family History  Problem Relation Age of Onset  . Heart attack Mother   . Heart attack Father   . Coronary artery disease Other      Social History Mr. Tal reports that he quit smoking about 38 years ago. He does not have any smokeless tobacco history on file. Mr. Grimaldo reports that he does not drink alcohol.   Review of Systems CONSTITUTIONAL: No weight loss, fever, chills, weakness or fatigue.  HEENT: Eyes: No visual loss, blurred vision, double vision or yellow sclerae.No hearing loss, sneezing, congestion, runny nose or sore throat.  SKIN: No rash or itching.  CARDIOVASCULAR: per HPI RESPIRATORY: No shortness of breath, cough or sputum.  GASTROINTESTINAL: No anorexia, nausea, vomiting or diarrhea. No abdominal pain or blood.  GENITOURINARY: No burning on urination, no polyuria NEUROLOGICAL: occasional dizziness MUSCULOSKELETAL: No muscle, back pain, joint pain or stiffness.  LYMPHATICS: No enlarged nodes. No history of splenectomy.  PSYCHIATRIC: No history of depression or anxiety.  ENDOCRINOLOGIC: No reports of sweating, cold or heat intolerance. No polyuria or polydipsia.  [name]   Physical Examination p 55 bp 116/60 Wt 204 lbs BMI 26 Gen: resting  comfortably, no acute distress HEENT: no scleral icterus, pupils equal round and reactive, no palptable cervical adenopathy,  CV: RRR, no m/r/g, no JVD, no carotid bruits Resp: Clear to auscultation bilaterally GI: abdomen is soft, non-tender, non-distended, normal bowel sounds, no hepatosplenomegaly MSK: extremities are warm, no edema.  Skin: warm, no rash Neuro:  no focal deficits Psych: appropriate affect   Diagnostic Studies Cath 2007 Central aortic pressure is 101/68 with a mean of 84. LV pressure is 94/1  with an EDP of 6. There is no aortic stenosis.  Left main was normal.  LAD was a large vessel coursing to the apex. There was very prominent  severe ectasia in the  proximal portion with aneurysmal dilatation and  swirling flow. However, no high grade stenosis. In the mid LAD, there was  a diffuse 40-50% segmental plaquing. In the distal LAD, there was a tubular  70-80% stenosis. There also were two diagonals. The first diagonal is a  medium vessel with a long 90% stenosis. The second diagonal was small with  branching vessel with a 70-80% proximal lesion.  The left circumflex was a moderate sized system. It gave off a large  branching OM-1 and a small OM-2. There is a 40% proximal lesion in the  circumflex. The lower branch of the OM was totally occluded and the distal  part of the vessel filled through collaterals with TIMI I flow. There is a  40% blockage in the upper portion of the OM.  The right coronary artery was a large dominant vessel. It gave off a  moderate sized PDA and two small PLs. There was evidence of previously  placed stent from the ostial portion down to the PDA. There was 40% ostial  stenosis with damping of the 6-French catheter. This did not change with  sublingual nitroglycerin. There is a 30% lesion in the proximal to mid  portion.  Left ventriculogram done the RAO position showed an EF of 70% with mild  mitral valve prolapse. There was no wall motion abnormalities and there was  no mitral regurgitation. Left ventriculogram done in the LAO position  showed an EF of 70% with no regional wall motion abnormalities.  Abdominal aortogram showed patent renal arteries bilaterally with no  abdominal aortic aneurysm.  A left subclavian done for possible bypass surgery showed a patent LIMA to  the chest wall with no obstructions in the subclavian.  ASSESSMENT:  1. Three vessel native coronary artery disease.  2. The LAD has diffuse ectasia and swelling flow proximally as described  above. There is a borderline lesion in the distal LAD and a high grade  disease in the diagonal, but these do not appear to be  the culprit  lesion.  3. Totally occluded OM1 which appears to be the culprit.  4. Patent right coronary stents.  5. Normal LV function with mild mitral valve prolapse but no significant  mitral regurgitation.  CONCLUSION: Successful PCI of a totally occluded circumflex marginal vessel  with improvement in narrowing from 100% to 0% and improvement in flow from  TIMI 0 to TIMI III flow using a bare metal stent.      Assessment and Plan   1. CAD - no current symptoms - continue risk factor modification and secondary prevention  2. HTN - bp at goal. Low heart rate in clinic, notes some orthostatic symptoms at times. Will decrease Toprol XL to 25mg  daily.   3. Hyperlipidemia - encouraged strict compliance with statin. Given his CAD  history will change to high dose statin, atorvastatin 80mg  daily. .    F/u 1 year  Arnoldo Lenis, M.D.

## 2014-08-26 NOTE — Patient Instructions (Signed)
Your physician wants you to follow-up in: 1 Year with Dr. Harl Bowie. You will receive a reminder letter in the mail two months in advance. If you don't receive a letter, please call our office to schedule the follow-up appointment.  Your physician has recommended you make the following change in your medication:   Increase Lipitor to 80 mg Daily  Decrease Toprol to 25 mg Daily  Thank you for choosing [facility]!

## 2014-08-28 ENCOUNTER — Telehealth: Payer: Self-pay | Admitting: Cardiology

## 2014-08-28 NOTE — Telephone Encounter (Signed)
The patient said the reference # for [facility] is [id number]. The phone number he provided for the [facility] is [phone].  Please call the patient back and let him know when everything is worked out.

## 2014-08-29 NOTE — Telephone Encounter (Signed)
[facility] did not need to talk with [place].They simply were telling the patient his Vit D was on back order until tomorrow, this was ordered by pcp.I notified pt

## 2014-10-31 ENCOUNTER — Other Ambulatory Visit: Payer: Self-pay | Admitting: Family Medicine

## 2014-10-31 NOTE — Telephone Encounter (Signed)
I would prefer that she take 2000 units daily instead of a prescription at this time. She can obtain this over-the-counter.

## 2014-10-31 NOTE — Telephone Encounter (Signed)
Last seen 07/23/14 Dr Livia Snellen  Last Vit D 07/03/14  27.6

## 2015-01-22 ENCOUNTER — Other Ambulatory Visit: Payer: Self-pay | Admitting: Family Medicine

## 2015-01-22 NOTE — Telephone Encounter (Signed)
Last seen 07/23/14  Dr Livia Snellen   Last Vit D 06/13/14  27.6

## 2015-01-23 ENCOUNTER — Ambulatory Visit (INDEPENDENT_AMBULATORY_CARE_PROVIDER_SITE_OTHER): Payer: BLUE CROSS/BLUE SHIELD | Admitting: Family Medicine

## 2015-01-23 ENCOUNTER — Encounter: Payer: Self-pay | Admitting: Family Medicine

## 2015-01-23 VITALS — BP 145/85 | HR 60 | Temp 97.1°F | Ht 74.0 in | Wt 203.2 lb

## 2015-01-23 DIAGNOSIS — E785 Hyperlipidemia, unspecified: Secondary | ICD-10-CM | POA: Diagnosis not present

## 2015-01-23 DIAGNOSIS — I10 Essential (primary) hypertension: Secondary | ICD-10-CM

## 2015-01-23 DIAGNOSIS — I2510 Atherosclerotic heart disease of native coronary artery without angina pectoris: Secondary | ICD-10-CM

## 2015-01-23 NOTE — Progress Notes (Signed)
Subjective:  Patient ID: [name], male    DOB: 09-21-1940  Age: 74 y.o. MRN: [id number]  CC: Hypertension and Hyperlipidemia   HPI [name] presents for  follow-up of hypertension. Patient has no history of headache chest pain or shortness of breath or recent cough. Patient also denies symptoms of TIA such as numbness weakness lateralizing. Patient checks  blood pressure at home and has not had any elevated readings recently. Patient denies side effects from his medication. States taking it regularly. Has been eating excess salt lately. Would like to cut back on that before considering medication changes.  Patient also  in for follow-up of elevated cholesterol. Doing well without complaints on current medication. Denies side effects of statin including myalgia and arthralgia and nausea. Also in today for liver function testing. Currently no chest pain, occasional shortness of breath. No  other cardiovascular related symptoms noted.     History [name] has a past medical history of CAD in native artery; Bradycardia; MI (myocardial infarction) (2004); History of mitral valve prolapse; Hyperlipidemia, mixed; Hypertension; History of GI bleed; and History of peptic ulcer disease.   He has past surgical history that includes Coronary stent placement (2004, 2007).   His family history includes Coronary artery disease in his other; Heart attack in his father and mother.He reports that he quit smoking about 38 years ago. He does not have any smokeless tobacco history on file. He reports that he does not drink alcohol or use illicit drugs.  Current Outpatient Prescriptions on File Prior to Visit  Medication Sig Dispense Refill  . aspirin 81 MG tablet Take 81 mg by mouth daily.      [name] atorvastatin (LIPITOR) 80 MG tablet Take 1 tablet (80 mg total) by mouth daily. 90 tablet 3  . metoprolol succinate (TOPROL-XL) 25 MG 24 hr tablet Take 1 tablet (25 mg total) by mouth daily. Take with or  immediately following a meal. 90 tablet 3  . nitroGLYCERIN (NITROSTAT) 0.4 MG SL tablet Place 1 tablet (0.4 mg total) under the tongue every 5 (five) minutes as needed for chest pain. 50 tablet 3   No current facility-administered medications on file prior to visit.    ROS Review of Systems  Constitutional: Negative for fever, chills and diaphoresis.  HENT: Negative for congestion, rhinorrhea and sore throat.   Respiratory: Negative for cough, shortness of breath and wheezing.   Cardiovascular: Negative for chest pain.  Gastrointestinal: Negative for nausea, vomiting, abdominal pain, diarrhea, constipation and abdominal distention.  Genitourinary: Negative for dysuria and frequency.  Musculoskeletal: Negative for joint swelling and arthralgias.  Skin: Negative for rash.  Neurological: Negative for headaches.    Objective:  BP 145/85 mmHg  Pulse 60  Temp(Src) 97.1 F (36.2 C) (Oral)  Ht '6\' 2"'  (1.88 m)  Wt 203 lb 3.2 oz (92.171 kg)  BMI 26.08 kg/m2  BP Readings from Last 3 Encounters:  01/23/15 145/85  08/26/14 116/60  07/23/14 130/80    Wt Readings from Last 3 Encounters:  01/23/15 203 lb 3.2 oz (92.171 kg)  08/26/14 204 lb (92.534 kg)  07/23/14 202 lb 9.6 oz (91.899 kg)     Physical Exam  Constitutional: He is oriented to person, place, and time. He appears well-developed and well-nourished. No distress.  HENT:  Head: Normocephalic and atraumatic.  Right Ear: External ear normal.  Left Ear: External ear normal.  Nose: Nose normal.  Mouth/Throat: Oropharynx is clear and moist.  Eyes: Conjunctivae and EOM are  normal. Pupils are equal, round, and reactive to light.  Neck: Normal range of motion. Neck supple. No thyromegaly present.  Cardiovascular: Normal rate, regular rhythm and normal heart sounds.   No murmur heard. Pulmonary/Chest: Effort normal and breath sounds normal. No respiratory distress. He has no wheezes. He has no rales.  Abdominal: Soft. Bowel sounds  are normal. He exhibits no distension. There is no tenderness.  Lymphadenopathy:    He has no cervical adenopathy.  Neurological: He is alert and oriented to person, place, and time. He has normal reflexes.  Skin: Skin is warm and dry.  Psychiatric: He has a normal mood and affect. His behavior is normal. Judgment and thought content normal.    No results found for: HGBA1C  Lab Results  Component Value Date   WBC 6.4 06/13/2014   HGB 15.4 06/13/2014   HCT 47.4 06/13/2014   PLT 144* 02/03/2012   GLUCOSE 100* 06/13/2014   CHOL 190 06/13/2014   TRIG 119 06/13/2014   HDL 47 06/13/2014   LDLDIRECT 142.8 01/28/2008   LDLCALC 130* 10/11/2010   ALT 16 06/13/2014   AST 15 06/13/2014   NA 144 06/13/2014   K 4.1 06/13/2014   CL 105 06/13/2014   CREATININE 1.18 06/13/2014   BUN 19 06/13/2014   CO2 28 06/13/2014   TSH 4.420 06/13/2014   PSA 1.2 06/13/2014    Nm Bone Scan Whole Body  06/15/2012   *RADIOLOGY REPORT*  Clinical Data: Elevated alkaline phosphatase  NUCLEAR MEDICINE WHOLE BODY BONE SCINTIGRAPHY  Technique:  Whole body anterior and posterior images were obtained approximately 3 hours after intravenous injection of radiopharmaceutical.  Radiopharmaceutical: 25MILLI CURIE TC-MDP TECHNETIUM TC 19M MEDRONATE IV KIT  Comparison: None Radiographic correlation:  CT abdomen pelvis 02/03/2012  Findings: Minimal uptake at bilateral AC joints, typically degenerative. Minimal asymmetry of uptake at the right lateral aspect of the lower lumbar spine at L4-L5 corresponding to degenerative disc disease changes on prior CT. No other abnormal sites of osseous tracer accumulation identified. Expected urinary tract and soft tissue distribution of tracer.  IMPRESSION: No significant scintigraphic abnormalities identified.   Original Report Authenticated By: Lavonia Dana, M.D.    Assessment & Plan:   [name] was seen today for hypertension and hyperlipidemia.  Diagnoses and all orders for this  visit:  Hyperlipemia -     CMP14+EGFR  Essential hypertension, benign -     CBC with Differential/Platelet -     CMP14+EGFR -     Lipid panel   I have discontinued [name] Vitamin D (Ergocalciferol). I am also having him maintain his aspirin, nitroGLYCERIN, atorvastatin, and metoprolol succinate.  No orders of the defined types were placed in this encounter.   Use allegra 180 mg daily for allergy symptoms. No prescription necessary.  Check your BP frequently. It should stay below 130/80 except after moderate exertion. DASH Eating Plan given  Follow-up: Return in about 6 months (around 07/23/2015).  Claretta Fraise, M.D.

## 2015-01-23 NOTE — Patient Instructions (Addendum)
Use allegra 180 mg daily for allergy symptoms. No prescription necessary.  Check your BP frequently. It should stay below 130/80 except after moderate exertion.  DASH Eating Plan DASH stands for "Dietary Approaches to Stop Hypertension." The DASH eating plan is a healthy eating plan that has been shown to reduce high blood pressure (hypertension). Additional health benefits may include reducing the risk of type 2 diabetes mellitus, heart disease, and stroke. The DASH eating plan may also help with weight loss. WHAT DO I NEED TO KNOW ABOUT THE DASH EATING PLAN? For the DASH eating plan, you will follow these general guidelines:  Choose foods with a percent daily value for sodium of less than 5% (as listed on the food label).  Use salt-free seasonings or herbs instead of table salt or sea salt.  Check with your health care provider or pharmacist before using salt substitutes.  Eat lower-sodium products, often labeled as "lower sodium" or "no salt added."  Eat fresh foods.  Eat more vegetables, fruits, and low-fat dairy products.  Choose whole grains. Look for the word "whole" as the first word in the ingredient list.  Choose fish and skinless chicken or [place] more often than red meat. Limit fish, poultry, and meat to 6 oz (170 g) each day.  Limit sweets, desserts, sugars, and sugary drinks.  Choose heart-healthy fats.  Limit cheese to 1 oz (28 g) per day.  Eat more home-cooked food and less restaurant, buffet, and fast food.  Limit fried foods.  Cook foods using methods other than frying.  Limit canned vegetables. If you do use them, rinse them well to decrease the sodium.  When eating at a restaurant, ask that your food be prepared with less salt, or no salt if possible. WHAT FOODS CAN I EAT? Seek help from a dietitian for individual calorie needs. Grains Whole grain or whole wheat bread. Brown rice. Whole grain or whole wheat pasta. Quinoa, bulgur, and whole grain cereals.  Low-sodium cereals. Corn or whole wheat flour tortillas. Whole grain cornbread. Whole grain crackers. Low-sodium crackers. Vegetables Fresh or frozen vegetables (raw, steamed, roasted, or grilled). Low-sodium or reduced-sodium tomato and vegetable juices. Low-sodium or reduced-sodium tomato sauce and paste. Low-sodium or reduced-sodium canned vegetables.  Fruits All fresh, canned (in natural juice), or frozen fruits. Meat and Other Protein Products Ground beef (85% or leaner), grass-fed beef, or beef trimmed of fat. Skinless chicken or [place]. Ground chicken or [place]. Pork trimmed of fat. All fish and seafood. Eggs. Dried beans, peas, or lentils. Unsalted nuts and seeds. Unsalted canned beans. Dairy Low-fat dairy products, such as skim or 1% milk, 2% or reduced-fat cheeses, low-fat ricotta or cottage cheese, or plain low-fat yogurt. Low-sodium or reduced-sodium cheeses. Fats and Oils Tub margarines without trans fats. Light or reduced-fat mayonnaise and salad dressings (reduced sodium). Avocado. Safflower, olive, or canola oils. Natural peanut or almond butter. Other Unsalted popcorn and pretzels. The items listed above may not be a complete list of recommended foods or beverages. Contact your dietitian for more options. WHAT FOODS ARE NOT RECOMMENDED? Grains White bread. White pasta. White rice. Refined cornbread. Bagels and croissants. Crackers that contain trans fat. Vegetables Creamed or fried vegetables. Vegetables in a cheese sauce. Regular canned vegetables. Regular canned tomato sauce and paste. Regular tomato and vegetable juices. Fruits Dried fruits. Canned fruit in light or heavy syrup. Fruit juice. Meat and Other Protein Products Fatty cuts of meat. Ribs, chicken wings, bacon, sausage, bologna, salami, chitterlings, fatback, hot dogs, bratwurst, and  packaged luncheon meats. Salted nuts and seeds. Canned beans with salt. Dairy Whole or 2% milk, cream, half-and-half, and cream  cheese. Whole-fat or sweetened yogurt. Full-fat cheeses or blue cheese. Nondairy creamers and whipped toppings. Processed cheese, cheese spreads, or cheese curds. Condiments Onion and garlic salt, seasoned salt, table salt, and sea salt. Canned and packaged gravies. Worcestershire sauce. Tartar sauce. Barbecue sauce. Teriyaki sauce. Soy sauce, including reduced sodium. Steak sauce. Fish sauce. Oyster sauce. Cocktail sauce. Horseradish. Ketchup and mustard. Meat flavorings and tenderizers. Bouillon cubes. Hot sauce. Tabasco sauce. Marinades. Taco seasonings. Relishes. Fats and Oils Butter, stick margarine, lard, shortening, ghee, and bacon fat. Coconut, palm kernel, or palm oils. Regular salad dressings. Other Pickles and olives. Salted popcorn and pretzels. The items listed above may not be a complete list of foods and beverages to avoid. Contact your dietitian for more information. WHERE CAN I FIND MORE INFORMATION? National Heart, Lung, and Blood Institute: travelstabloid.com Document Released: 04/14/2011 Document Revised: 09/09/2013 Document Reviewed: 02/27/2013 [facility] Patient Information 2015 [place], [place]. This information is not intended to replace advice given to you by your health care provider. Make sure you discuss any questions you have with your health care provider.

## 2015-01-24 LAB — CMP14+EGFR
A/G RATIO: 1.8 (ref 1.1–2.5)
ALT: 18 IU/L (ref 0–44)
AST: 18 IU/L (ref 0–40)
Albumin: 4.2 g/dL (ref 3.5–4.8)
Alkaline Phosphatase: 126 IU/L — ABNORMAL HIGH (ref 39–117)
BUN/Creatinine Ratio: 13 (ref 10–22)
BUN: 17 mg/dL (ref 8–27)
Bilirubin Total: 0.9 mg/dL (ref 0.0–1.2)
CALCIUM: 9.2 mg/dL (ref 8.6–10.2)
CHLORIDE: 104 mmol/L (ref 97–108)
CO2: 27 mmol/L (ref 18–29)
Creatinine, Ser: 1.3 mg/dL — ABNORMAL HIGH (ref 0.76–1.27)
GFR calc Af Amer: 63 mL/min/{1.73_m2} (ref >59)
GFR, EST NON AFRICAN AMERICAN: 54 mL/min/{1.73_m2} — AB (ref >59)
Globulin, Total: 2.3 g/dL (ref 1.5–4.5)
Glucose: 96 mg/dL (ref 65–99)
Potassium: 4.2 mmol/L (ref 3.5–5.2)
Sodium: 143 mmol/L (ref 134–144)
Total Protein: 6.5 g/dL (ref 6.0–8.5)

## 2015-01-24 LAB — CBC WITH DIFFERENTIAL/PLATELET
Basophils Absolute: 0 10*3/uL (ref 0.0–0.2)
Basos: 1 %
EOS (ABSOLUTE): 0.1 10*3/uL (ref 0.0–0.4)
Eos: 2 %
HEMATOCRIT: 43.7 % (ref 37.5–51.0)
Hemoglobin: 15 g/dL (ref 12.6–17.7)
IMMATURE GRANULOCYTES: 0 %
Immature Grans (Abs): 0 10*3/uL (ref 0.0–0.1)
LYMPHS ABS: 1.7 10*3/uL (ref 0.7–3.1)
Lymphs: 31 %
MCH: 30.3 pg (ref 26.6–33.0)
MCHC: 34.3 g/dL (ref 31.5–35.7)
MCV: 88 fL (ref 79–97)
MONOS ABS: 0.5 10*3/uL (ref 0.1–0.9)
Monocytes: 9 %
Neutrophils Absolute: 3.2 10*3/uL (ref 1.4–7.0)
Neutrophils: 57 %
PLATELETS: 230 10*3/uL (ref 150–379)
RBC: 4.95 x10E6/uL (ref 4.14–5.80)
RDW: 13.8 % (ref 12.3–15.4)
WBC: 5.5 10*3/uL (ref 3.4–10.8)

## 2015-01-24 LAB — LIPID PANEL
Chol/HDL Ratio: 6.2 ratio units — ABNORMAL HIGH (ref 0.0–5.0)
Cholesterol, Total: 308 mg/dL — ABNORMAL HIGH (ref 100–199)
HDL: 50 mg/dL (ref >39)
LDL Calculated: 229 mg/dL — ABNORMAL HIGH (ref 0–99)
TRIGLYCERIDES: 146 mg/dL (ref 0–149)
VLDL Cholesterol Cal: 29 mg/dL (ref 5–40)

## 2015-03-12 NOTE — Telephone Encounter (Signed)
finished

## 2015-03-24 ENCOUNTER — Telehealth: Payer: Self-pay | Admitting: Family Medicine

## 2015-04-15 DIAGNOSIS — I6521 Occlusion and stenosis of right carotid artery: Secondary | ICD-10-CM | POA: Diagnosis not present

## 2015-08-02 ENCOUNTER — Other Ambulatory Visit: Payer: Self-pay | Admitting: Cardiology

## 2015-08-11 ENCOUNTER — Ambulatory Visit (INDEPENDENT_AMBULATORY_CARE_PROVIDER_SITE_OTHER): Payer: Medicare Other | Admitting: Family Medicine

## 2015-08-11 ENCOUNTER — Encounter: Payer: Self-pay | Admitting: Family Medicine

## 2015-08-11 VITALS — BP 140/82 | HR 65 | Temp 97.2°F | Ht 74.0 in | Wt 202.4 lb

## 2015-08-11 DIAGNOSIS — I2510 Atherosclerotic heart disease of native coronary artery without angina pectoris: Secondary | ICD-10-CM

## 2015-08-11 DIAGNOSIS — E785 Hyperlipidemia, unspecified: Secondary | ICD-10-CM | POA: Diagnosis not present

## 2015-08-11 DIAGNOSIS — I10 Essential (primary) hypertension: Secondary | ICD-10-CM

## 2015-08-11 MED ORDER — METOPROLOL SUCCINATE ER 50 MG PO TB24: 50.0000 mg | ORAL_TABLET | Freq: Every day | ORAL | Status: DC

## 2015-08-11 NOTE — Progress Notes (Signed)
Subjective:  Patient ID: SUSAN ARANA, male    DOB: [birth year]  Age: 75 y.o. MRN: [id number]  CC: Hyperlipidemia and Diabetes   HPI HOSTEEN KIENAST presents for  follow-up of hypertension. Patient has no history of headache chest pain or shortness of breath or recent cough. Cardiac risk factors include previous MIs in 2002 & 2004. Patient also denies symptoms of TIA such as numbness weakness lateralizing. Patient checks  blood pressure at home and has not had any elevated readings recently. Patient denies side effects from his medication. States taking it regularly.  Patient also  in for follow-up of elevated cholesterol. Doing well without complaints on current medication. Denies side effects of statin including myalgia and arthralgia and nausea. Also in today for liver function testing. Currently no chest pain, shortness of breath or other cardiovascular related symptoms noted.     History Marty has a past medical history of CAD in native artery; Bradycardia; MI (myocardial infarction) ([place]) (2004); History of mitral valve prolapse; Hyperlipidemia, mixed; Hypertension; History of GI bleed; and History of peptic ulcer disease.   He has past surgical history that includes Coronary stent placement (2004, 2007).   His family history includes Coronary artery disease in his other; Heart attack in his father and mother.He reports that he quit smoking about 39 years ago. He does not have any smokeless tobacco history on file. He reports that he does not drink alcohol or use illicit drugs.  Current Outpatient Prescriptions on File Prior to Visit  Medication Sig Dispense Refill  . aspirin 81 MG tablet Take 81 mg by mouth daily.      [name] atorvastatin (LIPITOR) 80 MG tablet TAKE 1 TABLET DAILY 90 tablet 2  . nitroGLYCERIN (NITROSTAT) 0.4 MG SL tablet Place 1 tablet (0.4 mg total) under the tongue every 5 (five) minutes as needed for chest pain. 50 tablet 3   No current facility-administered  medications on file prior to visit.    ROS Review of Systems  Constitutional: Negative for fever, chills, diaphoresis and unexpected weight change.  HENT: Negative for congestion, hearing loss, rhinorrhea and sore throat.   Eyes: Negative for visual disturbance.  Respiratory: Negative for cough and shortness of breath.   Cardiovascular: Negative for chest pain.  Gastrointestinal: Negative for abdominal pain, diarrhea and constipation.  Genitourinary: Negative for dysuria and flank pain.  Musculoskeletal: Negative for joint swelling and arthralgias.  Skin: Negative for rash.  Neurological: Negative for dizziness and headaches.  Psychiatric/Behavioral: Negative for sleep disturbance and dysphoric mood.    Objective:  BP 140/82 mmHg  Pulse 65  Temp(Src) 97.2 F (36.2 C) (Oral)  Ht '6\' 2"'  (1.88 m)  Wt 202 lb 6.4 oz (91.808 kg)  BMI 25.98 kg/m2  SpO2 97%  BP Readings from Last 3 Encounters:  08/11/15 140/82  01/23/15 145/85  08/26/14 116/60    Wt Readings from Last 3 Encounters:  08/11/15 202 lb 6.4 oz (91.808 kg)  01/23/15 203 lb 3.2 oz (92.171 kg)  08/26/14 204 lb (92.534 kg)     Physical Exam  Constitutional: He is oriented to person, place, and time. He appears well-developed and well-nourished. No distress.  HENT:  Head: Normocephalic and atraumatic.  Right Ear: External ear normal.  Left Ear: External ear normal.  Nose: Nose normal.  Mouth/Throat: Oropharynx is clear and moist.  Eyes: Conjunctivae and EOM are normal. Pupils are equal, round, and reactive to light.  Neck: Normal range of motion. Neck supple. No thyromegaly present.  Cardiovascular: Normal rate, regular rhythm and normal heart sounds.   No murmur heard. Pulmonary/Chest: Effort normal and breath sounds normal. No respiratory distress. He has no wheezes. He has no rales.  Abdominal: Soft. Bowel sounds are normal. He exhibits no distension. There is no tenderness.  Lymphadenopathy:    He has no  cervical adenopathy.  Neurological: He is alert and oriented to person, place, and time. He has normal reflexes.  Skin: Skin is warm and dry.  Psychiatric: He has a normal mood and affect. His behavior is normal. Judgment and thought content normal.    No results found for: HGBA1C  Lab Results  Component Value Date   WBC 5.5 01/23/2015   HGB 15.4 06/13/2014   HCT 43.7 01/23/2015   PLT 230 01/23/2015   GLUCOSE 96 01/23/2015   CHOL 308* 01/23/2015   TRIG 146 01/23/2015   HDL 50 01/23/2015   LDLDIRECT 142.8 01/28/2008   LDLCALC 229* 01/23/2015   ALT 18 01/23/2015   AST 18 01/23/2015   NA 143 01/23/2015   K 4.2 01/23/2015   CL 104 01/23/2015   CREATININE 1.30* 01/23/2015   BUN 17 01/23/2015   CO2 27 01/23/2015   TSH 4.420 06/13/2014   PSA 1.2 06/13/2014    Nm Bone Scan Whole Body  06/15/2012  *RADIOLOGY REPORT* Clinical Data: Elevated alkaline phosphatase NUCLEAR MEDICINE WHOLE BODY BONE SCINTIGRAPHY Technique:  Whole body anterior and posterior images were obtained approximately 3 hours after intravenous injection of radiopharmaceutical. Radiopharmaceutical: 25MILLI CURIE TC-MDP TECHNETIUM TC 84M MEDRONATE IV KIT Comparison: None Radiographic correlation:  CT abdomen pelvis 02/03/2012 Findings: Minimal uptake at bilateral AC joints, typically degenerative. Minimal asymmetry of uptake at the right lateral aspect of the lower lumbar spine at L4-L5 corresponding to degenerative disc disease changes on prior CT. No other abnormal sites of osseous tracer accumulation identified. Expected urinary tract and soft tissue distribution of tracer. IMPRESSION: No significant scintigraphic abnormalities identified. Original Report Authenticated By: Lavonia Dana, M.D.    Assessment & Plan:   Alvah was seen today for hyperlipidemia and diabetes.  Diagnoses and all orders for this visit:  Hyperlipemia -     TSH + free T4 -     Lipid panel -     CMP14+EGFR  Essential hypertension, benign -      TSH + free T4 -     Lipid panel -     CMP14+EGFR  ASCVD (arteriosclerotic cardiovascular disease) -     TSH + free T4 -     Lipid panel -     CMP14+EGFR  Hyperlipidemia with target LDL less than 70 -     TSH + free T4 -     Lipid panel -     CMP14+EGFR  Other orders -     metoprolol succinate (TOPROL-XL) 50 MG 24 hr tablet; Take 1 tablet (50 mg total) by mouth daily.  I have changed Mr. Daniel metoprolol succinate. I am also having him maintain his aspirin, nitroGLYCERIN, and atorvastatin.  Meds ordered this encounter  Medications  . metoprolol succinate (TOPROL-XL) 50 MG 24 hr tablet    Sig: Take 1 tablet (50 mg total) by mouth daily.    Dispense:  30 tablet    Refill:  5     Follow-up: Return in about 6 months (around 02/10/2016) for CPE.  Claretta Fraise, M.D.

## 2015-08-12 LAB — CMP14+EGFR
A/G RATIO: 1.9 (ref 1.2–2.2)
ALBUMIN: 4.1 g/dL (ref 3.5–4.8)
ALK PHOS: 165 IU/L — AB (ref 39–117)
ALT: 22 IU/L (ref 0–44)
AST: 22 IU/L (ref 0–40)
BUN / CREAT RATIO: 15 (ref 10–24)
BUN: 17 mg/dL (ref 8–27)
Bilirubin Total: 0.9 mg/dL (ref 0.0–1.2)
CO2: 26 mmol/L (ref 18–29)
CREATININE: 1.15 mg/dL (ref 0.76–1.27)
Calcium: 9 mg/dL (ref 8.6–10.2)
Chloride: 105 mmol/L (ref 96–106)
GFR calc Af Amer: 73 mL/min/{1.73_m2} (ref >59)
GFR, EST NON AFRICAN AMERICAN: 63 mL/min/{1.73_m2} (ref >59)
GLOBULIN, TOTAL: 2.2 g/dL (ref 1.5–4.5)
Glucose: 101 mg/dL — ABNORMAL HIGH (ref 65–99)
POTASSIUM: 4.2 mmol/L (ref 3.5–5.2)
SODIUM: 146 mmol/L — AB (ref 134–144)
Total Protein: 6.3 g/dL (ref 6.0–8.5)

## 2015-08-12 LAB — TSH+FREE T4
FREE T4: 1.08 ng/dL (ref 0.82–1.77)
TSH: 4.17 u[IU]/mL (ref 0.450–4.500)

## 2015-08-12 LAB — LIPID PANEL
CHOLESTEROL TOTAL: 177 mg/dL (ref 100–199)
Chol/HDL Ratio: 3.5 ratio units (ref 0.0–5.0)
HDL: 51 mg/dL (ref >39)
LDL Calculated: 105 mg/dL — ABNORMAL HIGH (ref 0–99)
TRIGLYCERIDES: 104 mg/dL (ref 0–149)
VLDL CHOLESTEROL CAL: 21 mg/dL (ref 5–40)

## 2015-08-26 ENCOUNTER — Encounter (INDEPENDENT_AMBULATORY_CARE_PROVIDER_SITE_OTHER): Payer: Self-pay

## 2015-08-26 ENCOUNTER — Ambulatory Visit (INDEPENDENT_AMBULATORY_CARE_PROVIDER_SITE_OTHER): Payer: Medicare Other | Admitting: Family Medicine

## 2015-08-26 ENCOUNTER — Encounter: Payer: Self-pay | Admitting: Family Medicine

## 2015-08-26 VITALS — BP 129/81 | HR 73 | Temp 97.1°F | Ht 74.0 in | Wt 203.2 lb

## 2015-08-26 DIAGNOSIS — I10 Essential (primary) hypertension: Secondary | ICD-10-CM | POA: Diagnosis not present

## 2015-08-26 DIAGNOSIS — R748 Abnormal levels of other serum enzymes: Secondary | ICD-10-CM | POA: Diagnosis not present

## 2015-08-26 NOTE — Patient Instructions (Signed)
Alkaline phosphatase is a liver enzyme but it is also found in the bone and in red blood cells as well as other places in the body. It was slightly elevated recently. We need to recheck it from time to time to make sure it is not a problem by continuing to climb. Since it can come from various places it may take multiple tests to track down the cause if it continues to climb. We will keep you notified of your test results and any further steps that may become necessary.

## 2015-08-26 NOTE — Progress Notes (Signed)
Subjective:  Patient ID: [name], male    DOB: 25-Jan-1941  Age: 75 y.o. MRN: [id number]  CC: 2 week recheck   HPI [name] presents for r his 2 week follow up due to elevated BP at his visit 2 weeks ago. Pt also needs his liver enzymes repeated today due to alkaline phos being elevated 2 weeks ago.    History Deaire has a past medical history of CAD in native artery; Bradycardia; MI (myocardial infarction) ([place]) (2004); History of mitral valve prolapse; Hyperlipidemia, mixed; Hypertension; History of GI bleed; and History of peptic ulcer disease.   He has past surgical history that includes Coronary stent placement (2004, 2007).   His family history includes Coronary artery disease in his other; Heart attack in his father and mother.He reports that he quit smoking about 39 years ago. He does not have any smokeless tobacco history on file. He reports that he does not drink alcohol or use illicit drugs.    ROS Review of Systems  Constitutional: Negative for fever, chills and diaphoresis.  HENT: Negative for rhinorrhea and sore throat.   Respiratory: Negative for cough and shortness of breath.   Cardiovascular: Negative for chest pain.  Gastrointestinal: Negative for abdominal pain.  Musculoskeletal: Negative for myalgias and arthralgias.  Skin: Negative for rash.  Neurological: Negative for weakness and headaches.    Objective:  BP 129/81 mmHg  Pulse 73  Temp(Src) 97.1 F (36.2 C) (Oral)  Ht '6\' 2"'  (1.88 m)  Wt 203 lb 4 oz (92.194 kg)  BMI 26.08 kg/m2  BP Readings from Last 3 Encounters:  08/26/15 129/81  08/11/15 140/82  01/23/15 145/85    Wt Readings from Last 3 Encounters:  08/26/15 203 lb 4 oz (92.194 kg)  08/11/15 202 lb 6.4 oz (91.808 kg)  01/23/15 203 lb 3.2 oz (92.171 kg)     Physical Exam  Constitutional: He appears well-developed and well-nourished.  HENT:  Head: Normocephalic and atraumatic.  Right Ear: Tympanic membrane and external ear  normal. No decreased hearing is noted.  Left Ear: Tympanic membrane and external ear normal. No decreased hearing is noted.  Mouth/Throat: No oropharyngeal exudate or posterior oropharyngeal erythema.  Eyes: Pupils are equal, round, and reactive to light.  Neck: Normal range of motion. Neck supple.  Cardiovascular: Normal rate and regular rhythm.   No murmur heard. Pulmonary/Chest: Breath sounds normal. No respiratory distress.  Abdominal: Soft. Bowel sounds are normal. He exhibits no mass. There is no tenderness.  Vitals reviewed.    Lab Results  Component Value Date   WBC 5.5 01/23/2015   HGB 15.4 06/13/2014   HCT 43.7 01/23/2015   PLT 230 01/23/2015   GLUCOSE 101* 08/11/2015   CHOL 177 08/11/2015   TRIG 104 08/11/2015   HDL 51 08/11/2015   LDLDIRECT 142.8 01/28/2008   LDLCALC 105* 08/11/2015   ALT 22 08/11/2015   AST 22 08/11/2015   NA 146* 08/11/2015   K 4.2 08/11/2015   CL 105 08/11/2015   CREATININE 1.15 08/11/2015   BUN 17 08/11/2015   CO2 26 08/11/2015   TSH 4.170 08/11/2015   PSA 1.2 06/13/2014    Nm Bone Scan Whole Body  06/15/2012  *RADIOLOGY REPORT* Clinical Data: Elevated alkaline phosphatase NUCLEAR MEDICINE WHOLE BODY BONE SCINTIGRAPHY Technique:  Whole body anterior and posterior images were obtained approximately 3 hours after intravenous injection of radiopharmaceutical. Radiopharmaceutical: 25MILLI CURIE TC-MDP TECHNETIUM TC 27M MEDRONATE IV KIT Comparison: None Radiographic correlation:  CT abdomen  pelvis 02/03/2012 Findings: Minimal uptake at bilateral AC joints, typically degenerative. Minimal asymmetry of uptake at the right lateral aspect of the lower lumbar spine at L4-L5 corresponding to degenerative disc disease changes on prior CT. No other abnormal sites of osseous tracer accumulation identified. Expected urinary tract and soft tissue distribution of tracer. IMPRESSION: No significant scintigraphic abnormalities identified. Original Report  Authenticated By: Lavonia Dana, M.D.    Assessment & Plan:   Jaycub was seen today for 2 week recheck.  Diagnoses and all orders for this visit:  Elevated liver enzymes -     Hepatic function panel  Essential hypertension, benign -     Hepatic function panel    Alkaline phosphatase is a liver enzyme but it is also found in the bone and in red blood cells as well as other places in the body. It was slightly elevated recently. We need to recheck it from time to time to make sure it is not a problem by continuing to climb. Since it can come from various places it may take multiple tests to track down the cause if it continues to climb. We will keep you notified of your test results and any further steps that may become necessary.  I am having Mr. Demaree maintain his aspirin, nitroGLYCERIN, atorvastatin, and metoprolol succinate.  No orders of the defined types were placed in this encounter.     Follow-up: Return in about 6 months (around 02/25/2016), or if symptoms worsen or fail to improve.  Claretta Fraise, M.D.

## 2015-08-27 LAB — HEPATIC FUNCTION PANEL
ALT: 16 IU/L (ref 0–44)
AST: 20 IU/L (ref 0–40)
Albumin: 4.2 g/dL (ref 3.5–4.8)
Alkaline Phosphatase: 148 IU/L — ABNORMAL HIGH (ref 39–117)
BILIRUBIN TOTAL: 0.8 mg/dL (ref 0.0–1.2)
BILIRUBIN, DIRECT: 0.2 mg/dL (ref 0.00–0.40)
Total Protein: 6.4 g/dL (ref 6.0–8.5)

## 2016-02-04 NOTE — Progress Notes (Signed)
This encounter was created in error - please disregard.

## 2016-02-10 ENCOUNTER — Encounter: Payer: Self-pay | Admitting: Family Medicine

## 2016-02-10 ENCOUNTER — Ambulatory Visit (INDEPENDENT_AMBULATORY_CARE_PROVIDER_SITE_OTHER): Payer: Medicare Other | Admitting: Family Medicine

## 2016-02-10 VITALS — BP 127/87 | HR 60 | Temp 97.3°F | Ht 74.0 in | Wt 193.4 lb

## 2016-02-10 DIAGNOSIS — Z125 Encounter for screening for malignant neoplasm of prostate: Secondary | ICD-10-CM | POA: Diagnosis not present

## 2016-02-10 DIAGNOSIS — I10 Essential (primary) hypertension: Secondary | ICD-10-CM | POA: Diagnosis not present

## 2016-02-10 DIAGNOSIS — I2510 Atherosclerotic heart disease of native coronary artery without angina pectoris: Secondary | ICD-10-CM | POA: Diagnosis not present

## 2016-02-10 DIAGNOSIS — Z0000 Encounter for general adult medical examination without abnormal findings: Secondary | ICD-10-CM

## 2016-02-10 LAB — URINALYSIS
BILIRUBIN UA: NEGATIVE
Glucose, UA: NEGATIVE
Ketones, UA: NEGATIVE
Leukocytes, UA: NEGATIVE
NITRITE UA: NEGATIVE
PH UA: 6.5 (ref 5.0–7.5)
Protein, UA: NEGATIVE
RBC, UA: NEGATIVE
Specific Gravity, UA: 1.015 (ref 1.005–1.030)
UUROB: 1 mg/dL (ref 0.2–1.0)

## 2016-02-10 MED ORDER — ATORVASTATIN CALCIUM 80 MG PO TABS: 80.0000 mg | ORAL_TABLET | Freq: Every day | ORAL | 3 refills | Status: DC

## 2016-02-10 MED ORDER — METOPROLOL SUCCINATE ER 50 MG PO TB24: 50.0000 mg | ORAL_TABLET | Freq: Every day | ORAL | 3 refills | Status: DC

## 2016-02-10 NOTE — Progress Notes (Signed)
Subjective:  Patient ID: [name], male    DOB: May 04, 1941  Age: 75 y.o. MRN: [id number]  CC: Annual Exam   HPI [name] presents for Less DOE, chest pain lately. Occasionally will feel "the stents in me." Itis positional, not exertional.   History [name] has a past medical history of Bradycardia; CAD in native artery; History of GI bleed; History of mitral valve prolapse; History of peptic ulcer disease; Hyperlipidemia, mixed; Hypertension; and MI (myocardial infarction) (2004).   [name] has a past surgical history that includes Coronary stent placement (2004, 2007).   His family history includes Coronary artery disease in his other; Heart attack in his father and mother.[name] reports that [name] quit smoking about 39 years ago. [name] has never used smokeless tobacco. [name] reports that [name] does not drink alcohol or use drugs.    ROS Review of Systems  Constitutional: Negative for activity change, appetite change, chills, diaphoresis, fatigue, fever and unexpected weight change.  HENT: Negative for congestion, ear pain, hearing loss, postnasal drip, rhinorrhea, sore throat, tinnitus and trouble swallowing.   Eyes: Negative for photophobia, pain, discharge and redness.  Respiratory: Negative for apnea, cough, choking, chest tightness, shortness of breath, wheezing and stridor.   Cardiovascular: Negative for chest pain, palpitations and leg swelling.  Gastrointestinal: Positive for constipation (if [name] doesn't eat right). Negative for abdominal distention, abdominal pain, blood in stool, diarrhea, nausea and vomiting.  Endocrine: Negative for cold intolerance, heat intolerance, polydipsia, polyphagia and polyuria.  Genitourinary: Negative for difficulty urinating, dysuria, enuresis, flank pain, frequency, genital sores, hematuria and urgency.  Musculoskeletal: Negative for arthralgias and joint swelling.  Skin: Negative for color change, rash and wound.  Allergic/Immunologic: Negative for  immunocompromised state.  Neurological: Negative for dizziness, tremors, seizures, syncope, facial asymmetry, speech difficulty, weakness, light-headedness, numbness and headaches.  Hematological: Does not bruise/bleed easily.  Psychiatric/Behavioral: Positive for sleep disturbance (some nighttime wakening). Negative for agitation, behavioral problems, confusion, decreased concentration, dysphoric mood, hallucinations and suicidal ideas. The patient is not nervous/anxious and is not hyperactive.     Objective:  BP 127/87   Pulse 60   Temp 97.3 F (36.3 C) (Oral)   Ht '6\' 2"'  (1.88 m)   Wt 193 lb 6.4 oz (87.7 kg)   BMI 24.83 kg/m   BP Readings from Last 3 Encounters:  02/10/16 127/87  08/26/15 129/81  08/11/15 140/82    Wt Readings from Last 3 Encounters:  02/10/16 193 lb 6.4 oz (87.7 kg)  08/26/15 203 lb 4 oz (92.2 kg)  08/11/15 202 lb 6.4 oz (91.8 kg)     Physical Exam  Constitutional: [name] is oriented to person, place, and time. [name] appears well-developed and well-nourished.  HENT:  Head: Normocephalic and atraumatic.  Mouth/Throat: Oropharynx is clear and moist.  Eyes: EOM are normal. Pupils are equal, round, and reactive to light.  Neck: Normal range of motion. No tracheal deviation present. No thyromegaly present.  Cardiovascular: Normal rate, regular rhythm and normal heart sounds.  Exam reveals no gallop and no friction rub.   No murmur heard. Pulmonary/Chest: Breath sounds normal. [name] has no wheezes. [name] has no rales.  Abdominal: Soft. [name] exhibits no mass. There is no tenderness.  Musculoskeletal: Normal range of motion. [name] exhibits no edema.  Neurological: [name] is alert and oriented to person, place, and time.  Skin: Skin is warm and dry.  Psychiatric: [name] has a normal mood and affect.     Lab Results  Component Value Date  WBC 5.5 01/23/2015   HGB 15.4 06/13/2014   HCT 43.7 01/23/2015   PLT 230 01/23/2015   GLUCOSE 101 (H) 08/11/2015   CHOL 177 08/11/2015    TRIG 104 08/11/2015   HDL 51 08/11/2015   LDLDIRECT 142.8 01/28/2008   LDLCALC 105 (H) 08/11/2015   ALT 16 08/26/2015   AST 20 08/26/2015   NA 146 (H) 08/11/2015   K 4.2 08/11/2015   CL 105 08/11/2015   CREATININE 1.15 08/11/2015   BUN 17 08/11/2015   CO2 26 08/11/2015   TSH 4.170 08/11/2015   PSA 1.2 06/13/2014    Nm Bone Scan Whole Body  Result Date: 06/15/2012 *RADIOLOGY REPORT* Clinical Data: Elevated alkaline phosphatase NUCLEAR MEDICINE WHOLE BODY BONE SCINTIGRAPHY Technique:  Whole body anterior and posterior images were obtained approximately 3 hours after intravenous injection of radiopharmaceutical. Radiopharmaceutical: 25MILLI CURIE TC-MDP TECHNETIUM TC 76M MEDRONATE IV KIT Comparison: None Radiographic correlation:  CT abdomen pelvis 02/03/2012 Findings: Minimal uptake at bilateral AC joints, typically degenerative. Minimal asymmetry of uptake at the right lateral aspect of the lower lumbar spine at L4-L5 corresponding to degenerative disc disease changes on prior CT. No other abnormal sites of osseous tracer accumulation identified. Expected urinary tract and soft tissue distribution of tracer. IMPRESSION: No significant scintigraphic abnormalities identified. Original Report Authenticated By: Lavonia Dana, M.D.    Assessment & Plan:   [name] was seen today for annual exam.  Diagnoses and all orders for this visit:  ASCVD (arteriosclerotic cardiovascular disease) -     CBC with Differential/Platelet -     CMP14+EGFR -     Lipid panel -     Urinalysis  Essential hypertension, benign -     CBC with Differential/Platelet -     CMP14+EGFR -     Urinalysis  Well adult exam -     Urinalysis  Screening for prostate cancer -     PSA Total (Reflex To Free)  Other orders -     atorvastatin (LIPITOR) 80 MG tablet; Take 1 tablet (80 mg total) by mouth daily. -     metoprolol succinate (TOPROL-XL) 50 MG 24 hr tablet; Take 1 tablet (50 mg total) by mouth daily.      I  have changed [name] atorvastatin. I am also having him maintain his aspirin, nitroGLYCERIN, and metoprolol succinate.  Meds ordered this encounter  Medications  . atorvastatin (LIPITOR) 80 MG tablet    Sig: Take 1 tablet (80 mg total) by mouth daily.    Dispense:  90 tablet    Refill:  3  . metoprolol succinate (TOPROL-XL) 50 MG 24 hr tablet    Sig: Take 1 tablet (50 mg total) by mouth daily.    Dispense:  90 tablet    Refill:  3     Follow-up: Return in about 6 months (around 08/10/2016).  Claretta Fraise, M.D.

## 2016-02-11 LAB — LIPID PANEL
CHOLESTEROL TOTAL: 295 mg/dL — AB (ref 100–199)
Chol/HDL Ratio: 5.3 ratio units — ABNORMAL HIGH (ref 0.0–5.0)
HDL: 56 mg/dL (ref >39)
LDL Calculated: 209 mg/dL — ABNORMAL HIGH (ref 0–99)
Triglycerides: 148 mg/dL (ref 0–149)
VLDL CHOLESTEROL CAL: 30 mg/dL (ref 5–40)

## 2016-02-11 LAB — CBC WITH DIFFERENTIAL/PLATELET
BASOS: 1 %
Basophils Absolute: 0 10*3/uL (ref 0.0–0.2)
EOS (ABSOLUTE): 0.1 10*3/uL (ref 0.0–0.4)
EOS: 3 %
Hematocrit: 44.7 % (ref 37.5–51.0)
Hemoglobin: 15.6 g/dL (ref 12.6–17.7)
IMMATURE GRANS (ABS): 0 10*3/uL (ref 0.0–0.1)
Immature Granulocytes: 0 %
LYMPHS ABS: 1.5 10*3/uL (ref 0.7–3.1)
Lymphs: 30 %
MCH: 30.5 pg (ref 26.6–33.0)
MCHC: 34.9 g/dL (ref 31.5–35.7)
MCV: 88 fL (ref 79–97)
MONOCYTES: 7 %
MONOS ABS: 0.4 10*3/uL (ref 0.1–0.9)
Neutrophils Absolute: 3 10*3/uL (ref 1.4–7.0)
Neutrophils: 59 %
PLATELETS: 232 10*3/uL (ref 150–379)
RBC: 5.11 x10E6/uL (ref 4.14–5.80)
RDW: 14 % (ref 12.3–15.4)
WBC: 5 10*3/uL (ref 3.4–10.8)

## 2016-02-11 LAB — CMP14+EGFR
ALK PHOS: 142 IU/L — AB (ref 39–117)
ALT: 14 IU/L (ref 0–44)
AST: 18 IU/L (ref 0–40)
Albumin/Globulin Ratio: 1.8 (ref 1.2–2.2)
Albumin: 4.2 g/dL (ref 3.5–4.8)
BILIRUBIN TOTAL: 1 mg/dL (ref 0.0–1.2)
BUN/Creatinine Ratio: 14 (ref 10–24)
BUN: 17 mg/dL (ref 8–27)
CHLORIDE: 101 mmol/L (ref 96–106)
CO2: 28 mmol/L (ref 18–29)
Calcium: 9.4 mg/dL (ref 8.6–10.2)
Creatinine, Ser: 1.21 mg/dL (ref 0.76–1.27)
GFR calc non Af Amer: 59 mL/min/{1.73_m2} — ABNORMAL LOW (ref >59)
GFR, EST AFRICAN AMERICAN: 68 mL/min/{1.73_m2} (ref >59)
GLUCOSE: 92 mg/dL (ref 65–99)
Globulin, Total: 2.4 g/dL (ref 1.5–4.5)
Potassium: 4.4 mmol/L (ref 3.5–5.2)
Sodium: 140 mmol/L (ref 134–144)
TOTAL PROTEIN: 6.6 g/dL (ref 6.0–8.5)

## 2016-02-11 LAB — PSA TOTAL (REFLEX TO FREE): Prostate Specific Ag, Serum: 1.3 ng/mL (ref 0.0–4.0)

## 2016-02-24 ENCOUNTER — Ambulatory Visit (INDEPENDENT_AMBULATORY_CARE_PROVIDER_SITE_OTHER): Payer: Medicare Other | Admitting: *Deleted

## 2016-02-24 VITALS — BP 129/79 | HR 68 | Ht 74.0 in | Wt 196.0 lb

## 2016-02-24 DIAGNOSIS — Z0000 Encounter for general adult medical examination without abnormal findings: Secondary | ICD-10-CM | POA: Diagnosis not present

## 2016-02-24 DIAGNOSIS — H9193 Unspecified hearing loss, bilateral: Secondary | ICD-10-CM

## 2016-02-24 DIAGNOSIS — Z1211 Encounter for screening for malignant neoplasm of colon: Secondary | ICD-10-CM

## 2016-02-24 DIAGNOSIS — M21371 Foot drop, right foot: Secondary | ICD-10-CM

## 2016-02-24 NOTE — Patient Instructions (Addendum)
  [name] ,  Thank you for taking time to come for your Medicare Wellness Visit. I appreciate your ongoing commitment to your health goals. Please review the following plan we discussed and let me know if I can assist you in the future.   -Return stool specimen card at your earliest convenience.  -Schedule yearly eye exam for glaucoma screening. -I will order a referral for a hearing evaluation with an audiologist. You should receive a call within the next week with the appointment information.  -I will place a podiatry referral to discuss treatment for foot drop  These are the goals we discussed: Goals    . Plan meals          Plan to eat at least 2 meals a day with a few snacks during the day       This is a list of the screening recommended for you and due dates:  Health Maintenance  Topic Date Due  . Tetanus Vaccine  08/26/1960  . Colon Cancer Screening  08/27/1991  . Shingles Vaccine  08/26/2001  . Flu Shot  08/06/2016*  . Pneumonia vaccines  Completed  *Topic was postponed. The date shown is not the original due date.

## 2016-02-25 NOTE — Progress Notes (Signed)
Subjective:   [name] is a 75 y.o. male who presents for an Initial Medicare Annual Wellness Visit. [name] lives at home with his wife. He is very involved with his church and stays busy around the house with yardwork. He also builds bird houses as a hobby. He is retired from the [employer]. He had two daughters. One passed away at 8 days old and the other at age 62 due to cancer. He reports that he thinks of this daily but feels that he is coping well. His [name] faith is a big help.   Review of Systems  Donald Mcsween reports that his health is about the same as it was last year.  Cardiac Risk Factors include: advanced age (>20men, >9 women);dyslipidemia;hypertension;male gender  Suffers from right foot drop. Had a brace made about 4-5 years ago but doesn't find it very comfortable. He tries to wear high top boots to help keep his foot aligned properly.   Hearing-Some decreased hearing. Interested in audiology consult.   Other systems negative. Objective:    Today's Vitals   02/24/16 0919  BP: 129/79  Pulse: 68  Weight: 196 lb (88.9 kg)  Height: 6\' 2"  (1.88 m)   Body mass index is 25.16 kg/m.  Current Medications (verified) Outpatient Encounter Prescriptions as of 02/24/2016  Medication Sig  . aspirin 81 MG tablet Take 81 mg by mouth daily.    [name] atorvastatin (LIPITOR) 80 MG tablet Take 1 tablet (80 mg total) by mouth daily.  . metoprolol succinate (TOPROL-XL) 50 MG 24 hr tablet Take 1 tablet (50 mg total) by mouth daily.  . nitroGLYCERIN (NITROSTAT) 0.4 MG SL tablet Place 1 tablet (0.4 mg total) under the tongue every 5 (five) minutes as needed for chest pain.   No facility-administered encounter medications on file as of 02/24/2016.     Allergies (verified) Ibuprofen   History: Past Medical History:  Diagnosis Date  . Bradycardia   . CAD in native artery   . History of GI bleed   . History of mitral valve prolapse   . History of peptic ulcer disease     . Hyperlipidemia, mixed   . Hypertension   . MI (myocardial infarction) 2000, 2004   Past Surgical History:  Procedure Laterality Date  . CORONARY STENT PLACEMENT  2004, 2007   5 in 2004, 1 in 2007   Family History  Problem Relation Age of Onset  . Heart attack Mother   . Heart attack Father   . Coronary artery disease Other   . Cancer Daughter    Social History   Occupational History  . Retired in 2006    Social History Main Topics  . Smoking status: Former Smoker    Quit date: 05/09/1976  . Smokeless tobacco: Never Used  . Alcohol use No  . Drug use: No  . Sexual activity: Not Currently   Tobacco Counseling No tobacco use  Activities of Daily Living In your present state of health, do you have any difficulty performing the following activities: 02/24/2016 08/11/2015  Hearing? [name]  Vision? N N  Difficulty concentrating or making decisions? N N  Walking or climbing stairs? N N  Dressing or bathing? N N  Doing errands, shopping? N N  Preparing Food and eating ? N -  Using the Toilet? N -  In the past six months, have you accidently leaked urine? N -  Do you have problems with loss of bowel control? N -  Managing your Medications? N -  Managing your Finances? N -  Housekeeping or managing your Housekeeping? N -  Some recent data might be hidden    Immunizations and Health Maintenance Immunization History  Administered Date(s) Administered  . Pneumococcal Conjugate-13 07/23/2014  . Pneumococcal Polysaccharide-23 07/30/2011   There are no preventive care reminders to display for this patient.  Patient Care Team: Claretta Fraise, MD as PCP - General (Family Medicine)      Assessment:   This is a routine wellness examination for [name].   Hearing/Vision  Reports some hearing loss. No difficulty with vision.   Dietary issues and exercise activities discussed: Current Exercise Habits: The patient does not participate in regular exercise at present (Does work  around the yard and house regularly. He used to walk regularly but has right foot drop which makes it difficult to walk well.), Exercise limited by: respiratory conditions(s);cardiac condition(s);neurologic condition(s)  He doesn't eat a lot of full meals but eats when he feels hungry. He mainly grazes during the day and stops when full.   Goals    . Plan meals          Plan to eat at least 2 meals a day with a few snacks during the day      Depression Screen PHQ 2/9 Scores 02/24/2016 02/10/2016 01/23/2015 05/22/2014  PHQ - 2 Score 0 0 0 1    Fall Risk Fall Risk  02/24/2016 02/10/2016 01/23/2015 05/22/2014  Falls in the past year? Yes Yes Yes Yes  Number falls in past yr: 2 or more 2 or more 2 or more 2 or more  Injury with Fall? No No No -  Risk for fall due to : History of fall(s);Impaired balance/gait - - -  Follow up Falls prevention discussed - Falls prevention discussed -  Podiatry referral placed to address foot drop and use of brace.  Cognitive Function: MMSE - Mini Mental State Exam 02/24/2016  Orientation to time 5  Orientation to Place 5  Registration 3  Attention/ Calculation 5  Recall 3  Language- name 2 objects 2  Language- repeat 1  Language- follow 3 step command 3  Language- read & follow direction 1  Write a sentence 1  Copy design 1  Total score 30    No deficit noted    Screening Tests Health Maintenance  Topic Date Due  . ZOSTAVAX  03/26/2016 (Originally 08/26/2001)  . TETANUS/TDAP  03/26/2016 (Originally 08/26/1960)  . INFLUENZA VACCINE  08/06/2016 (Originally 12/08/2015)  . COLONOSCOPY  02/23/2017 (Originally 08/27/1991)  . PNA vac Low Risk Adult  Completed        Plan:  Return [facility]T Audiology and Podiatry referrals placed   During the course of the visit Jedd was educated and counseled about the following appropriate screening and preventive services:   Vaccines: Pneumonia up to date. Flu, zostavax, and tdap declined today.   Colorectal  cancer screening-FOBT given  Diabetes screening-with routine labs  Glaucoma screening-suggested annual eye exams  Nutrition counseling  Patient Instructions (the written plan) were given to the patient.   Chong Sicilian, RN  02/25/2016

## 2016-02-26 ENCOUNTER — Other Ambulatory Visit (INDEPENDENT_AMBULATORY_CARE_PROVIDER_SITE_OTHER): Payer: Medicare Other

## 2016-02-26 DIAGNOSIS — Z1211 Encounter for screening for malignant neoplasm of colon: Secondary | ICD-10-CM

## 2016-02-28 LAB — FECAL OCCULT BLOOD, IMMUNOCHEMICAL: FECAL OCCULT BLD: NEGATIVE

## 2016-03-15 ENCOUNTER — Telehealth: Payer: Self-pay | Admitting: Family Medicine

## 2016-08-10 ENCOUNTER — Ambulatory Visit (INDEPENDENT_AMBULATORY_CARE_PROVIDER_SITE_OTHER): Payer: Medicare Other | Admitting: Family Medicine

## 2016-08-10 ENCOUNTER — Encounter: Payer: Self-pay | Admitting: Family Medicine

## 2016-08-10 ENCOUNTER — Ambulatory Visit: Payer: Medicare Other | Admitting: Family Medicine

## 2016-08-10 VITALS — BP 116/72 | HR 64 | Temp 97.3°F | Ht 74.0 in | Wt 198.0 lb

## 2016-08-10 DIAGNOSIS — M21371 Foot drop, right foot: Secondary | ICD-10-CM

## 2016-08-10 DIAGNOSIS — I2510 Atherosclerotic heart disease of native coronary artery without angina pectoris: Secondary | ICD-10-CM

## 2016-08-10 DIAGNOSIS — I10 Essential (primary) hypertension: Secondary | ICD-10-CM

## 2016-08-10 DIAGNOSIS — E785 Hyperlipidemia, unspecified: Secondary | ICD-10-CM | POA: Diagnosis not present

## 2016-08-10 NOTE — Progress Notes (Signed)
Subjective:  Patient ID: [name], male    DOB: 06/12/1940  Age: 76 y.o. MRN: [id number]  CC: Hyperlipidemia (pt here today for routine follow up on his cholesterol. No other concerns voiced at this time.)   HPI [name] presents for Patient in for follow-up of elevated cholesterol. Doing well without complaints on current medication. Denies side effects of statin including myalgia and arthralgia and nausea. Also in today for liver function testing. Currently no chest pain, shortness of breath or other cardiovascular related symptoms noted.    follow-up of hypertension. Patient has no history of headache chest pain or shortness of breath or recent cough. Patient also denies symptoms of TIA such as numbness weakness lateralizing. Patient checks  blood pressure at home and has not had any elevated readings recently. Patient denies side effects from his medication. States taking it regularly. Patient with multiple risk factors for heart disease but currently no evidence for active disease   History Olga has a past medical history of Bradycardia; CAD in native artery; History of GI bleed; History of mitral valve prolapse; History of peptic ulcer disease; Hyperlipidemia, mixed; Hypertension; and MI (myocardial infarction) (2000, 2004).   He has a past surgical history that includes Coronary stent placement (2004, 2007).   His family history includes Cancer in his daughter; Coronary artery disease in his other; Heart attack in his father and mother.He reports that he quit smoking about 40 years ago. He has never used smokeless tobacco. He reports that he does not drink alcohol or use drugs.    ROS Review of Systems  Constitutional: Negative for chills, diaphoresis, fever and unexpected weight change.  HENT: Negative for congestion, hearing loss, rhinorrhea and sore throat.   Eyes: Negative for visual disturbance.  Respiratory: Negative for cough and shortness of breath.     Cardiovascular: Negative for chest pain.  Gastrointestinal: Negative for abdominal pain, constipation and diarrhea.  Genitourinary: Negative for dysuria and flank pain.  Musculoskeletal: Positive for gait problem (Pt. reports hx of difficulty lifting foot. Stumbles easily over steps, etc.). Negative for arthralgias and joint swelling.  Skin: Negative for rash.  Neurological: Negative for dizziness and headaches.  Psychiatric/Behavioral: Negative for dysphoric mood and sleep disturbance.    Objective:  BP 116/72   Pulse 64   Temp 97.3 F (36.3 C) (Oral)   Ht '6\' 2"'  (1.88 m)   Wt 198 lb (89.8 kg)   BMI 25.42 kg/m   BP Readings from Last 3 Encounters:  08/10/16 116/72  02/24/16 129/79  02/10/16 127/87    Wt Readings from Last 3 Encounters:  08/10/16 198 lb (89.8 kg)  02/24/16 196 lb (88.9 kg)  02/10/16 193 lb 6.4 oz (87.7 kg)     Physical Exam  Constitutional: He is oriented to person, place, and time. He appears well-developed and well-nourished. No distress.  HENT:  Head: Normocephalic and atraumatic.  Right Ear: External ear normal.  Left Ear: External ear normal.  Nose: Nose normal.  Mouth/Throat: Oropharynx is clear and moist.  Eyes: Conjunctivae and EOM are normal. Pupils are equal, round, and reactive to light.  Neck: Normal range of motion. Neck supple. No thyromegaly present.  Cardiovascular: Normal rate, regular rhythm and normal heart sounds.   No murmur heard. Pulmonary/Chest: Effort normal and breath sounds normal. No respiratory distress. He has no wheezes. He has no rales.  Abdominal: Soft. Bowel sounds are normal. He exhibits no distension. There is no tenderness.  Musculoskeletal:  Unable to  dorsiflex right foot   Lymphadenopathy:    He has no cervical adenopathy.  Neurological: He is alert and oriented to person, place, and time. He has normal reflexes.  Skin: Skin is warm and dry.  Psychiatric: He has a normal mood and affect. His behavior is  normal. Judgment and thought content normal.      Assessment & Plan:   Sajan was seen today for hyperlipidemia.  Diagnoses and all orders for this visit:  Essential hypertension, benign -     CMP14+EGFR  Hyperlipidemia with target LDL less than 70 -     Lipid panel -     CMP14+EGFR  ASCVD (arteriosclerotic cardiovascular disease) -     CMP14+EGFR  Foot drop, right foot -     Ambulatory referral to Orthopedics       I am having Mr. Trier maintain his aspirin, nitroGLYCERIN, atorvastatin, and metoprolol succinate.  Allergies as of 08/10/2016      Reactions   Ibuprofen       Medication List       Accurate as of 08/10/16  8:41 AM. Always use your most recent med list.          aspirin 81 MG tablet Take 81 mg by mouth daily.   atorvastatin 80 MG tablet Commonly known as:  LIPITOR Take 1 tablet (80 mg total) by mouth daily.   metoprolol succinate 50 MG 24 hr tablet Commonly known as:  TOPROL-XL Take 1 tablet (50 mg total) by mouth daily.   nitroGLYCERIN 0.4 MG SL tablet Commonly known as:  NITROSTAT Place 1 tablet (0.4 mg total) under the tongue every 5 (five) minutes as needed for chest pain.      For the foot drop the patient is going to need a brace. He is stated ankle rolling both for inversion and plantar flexion  Follow-up: No Follow-up on file.  Claretta Fraise, M.D.

## 2016-08-11 LAB — LIPID PANEL
CHOL/HDL RATIO: 5 ratio (ref 0.0–5.0)
Cholesterol, Total: 307 mg/dL — ABNORMAL HIGH (ref 100–199)
HDL: 61 mg/dL (ref >39)
LDL CALC: 222 mg/dL — AB (ref 0–99)
TRIGLYCERIDES: 121 mg/dL (ref 0–149)
VLDL CHOLESTEROL CAL: 24 mg/dL (ref 5–40)

## 2016-08-11 LAB — CMP14+EGFR
A/G RATIO: 1.7 (ref 1.2–2.2)
ALK PHOS: 135 IU/L — AB (ref 39–117)
ALT: 15 IU/L (ref 0–44)
AST: 20 IU/L (ref 0–40)
Albumin: 4.2 g/dL (ref 3.5–4.8)
BILIRUBIN TOTAL: 0.7 mg/dL (ref 0.0–1.2)
BUN / CREAT RATIO: 13 (ref 10–24)
BUN: 17 mg/dL (ref 8–27)
CALCIUM: 9.4 mg/dL (ref 8.6–10.2)
CO2: 26 mmol/L (ref 18–29)
Chloride: 102 mmol/L (ref 96–106)
Creatinine, Ser: 1.32 mg/dL — ABNORMAL HIGH (ref 0.76–1.27)
GFR, EST AFRICAN AMERICAN: 61 mL/min/{1.73_m2} (ref >59)
GFR, EST NON AFRICAN AMERICAN: 53 mL/min/{1.73_m2} — AB (ref >59)
GLOBULIN, TOTAL: 2.5 g/dL (ref 1.5–4.5)
Glucose: 90 mg/dL (ref 65–99)
POTASSIUM: 4.3 mmol/L (ref 3.5–5.2)
Sodium: 143 mmol/L (ref 134–144)
Total Protein: 6.7 g/dL (ref 6.0–8.5)

## 2016-08-12 ENCOUNTER — Other Ambulatory Visit: Payer: Self-pay

## 2016-08-12 ENCOUNTER — Other Ambulatory Visit: Payer: Self-pay | Admitting: Family Medicine

## 2016-08-12 MED ORDER — METOPROLOL SUCCINATE ER 50 MG PO TB24: 50.0000 mg | ORAL_TABLET | Freq: Every day | ORAL | 3 refills | Status: DC

## 2016-08-12 MED ORDER — ATORVASTATIN CALCIUM 80 MG PO TABS: 80.0000 mg | ORAL_TABLET | Freq: Every day | ORAL | 3 refills | Status: DC

## 2016-08-12 NOTE — Progress Notes (Unsigned)
Lipitor re sent to correct pharmacy.

## 2016-08-12 NOTE — Telephone Encounter (Signed)
Metoprolol was sent to [facility] and patient is no longer using it due to insurance. Re sent to [facility] as patient requested.

## 2016-08-22 ENCOUNTER — Encounter: Payer: Self-pay | Admitting: Nurse Practitioner

## 2016-08-22 ENCOUNTER — Ambulatory Visit (INDEPENDENT_AMBULATORY_CARE_PROVIDER_SITE_OTHER): Payer: Medicare Other | Admitting: Nurse Practitioner

## 2016-08-22 ENCOUNTER — Ambulatory Visit (INDEPENDENT_AMBULATORY_CARE_PROVIDER_SITE_OTHER): Payer: Medicare Other

## 2016-08-22 VITALS — BP 96/62 | HR 47 | Temp 97.3°F | Ht 74.0 in | Wt 204.0 lb

## 2016-08-22 DIAGNOSIS — R1031 Right lower quadrant pain: Secondary | ICD-10-CM

## 2016-08-22 DIAGNOSIS — N201 Calculus of ureter: Secondary | ICD-10-CM

## 2016-08-22 DIAGNOSIS — R1032 Left lower quadrant pain: Secondary | ICD-10-CM | POA: Diagnosis not present

## 2016-08-22 LAB — MICROSCOPIC EXAMINATION
Bacteria, UA: NONE SEEN
RENAL EPITHEL UA: NONE SEEN /HPF

## 2016-08-22 LAB — URINALYSIS, COMPLETE
BILIRUBIN UA: NEGATIVE
Glucose, UA: NEGATIVE
KETONES UA: NEGATIVE
Leukocytes, UA: NEGATIVE
Nitrite, UA: NEGATIVE
PH UA: 6 (ref 5.0–7.5)
Specific Gravity, UA: >1.03 — ABNORMAL HIGH (ref 1.005–1.030)
UUROB: 1 mg/dL (ref 0.2–1.0)

## 2016-08-22 NOTE — Progress Notes (Signed)
   Subjective:    Patient ID: [name], male    DOB: 1940-09-10, 76 y.o.   MRN: [id number]  HPI Patient comes in today c/o right lower quadrant and groin pain that started about 2 weeks ago. Resolved on its own. Thought was kidney stones because he has had in the past. This past Sunday morning he started having pain in right lower quadrant again. Pain 10/10, chills and nausea and vomiting. Currently he is not huritng at all.    Review of Systems  Constitutional: Positive for appetite change (no appetite at all) and chills. Negative for fever.  HENT: Negative.   Respiratory: Negative.   Cardiovascular: Negative.   Gastrointestinal: Positive for nausea and vomiting. Negative for constipation and diarrhea.  Genitourinary: Negative for dysuria, frequency and urgency.  Neurological: Negative.   Psychiatric/Behavioral: Negative.        Objective:   Physical Exam  Constitutional: He is oriented to person, place, and time. He appears well-developed and well-nourished. No distress.  HENT:  Right Ear: External ear normal.  Left Ear: External ear normal.  Nose: Nose normal.  Mouth/Throat: Oropharynx is clear and moist.  Neck: Normal range of motion. Neck supple.  Cardiovascular: Normal rate and regular rhythm.   Pulmonary/Chest: Effort normal and breath sounds normal.  Abdominal: Soft. Bowel sounds are normal. He exhibits no distension and no mass. There is no tenderness. There is no rebound and no guarding.  Neurological: He is alert and oriented to person, place, and time.  Skin: Skin is warm.  Psychiatric: He has a normal mood and affect. His behavior is normal. Judgment and thought content normal.   BP 96/62   Pulse (!) 47   Temp 97.3 F (36.3 C) (Oral)   Ht 6\' 2"  (1.88 m)   Wt 204 lb (92.5 kg)   BMI 26.19 kg/m   UA- 1+ blood  KUB- right ureteral calculi-Preliminary reading by Ronnald Collum, FNP  [facility]     Assessment & Plan:  1. Right lower quadrant abdominal pain -  Urinalysis, Complete - DG Abd 1 View; Future  2. Ureteral calculi probale passing of ureteral calcul over the weekend force fluids RTO if pain returns  [place], FNP

## 2016-08-22 NOTE — Patient Instructions (Signed)

## 2016-09-08 DIAGNOSIS — M21371 Foot drop, right foot: Secondary | ICD-10-CM | POA: Diagnosis not present

## 2016-10-07 DIAGNOSIS — M21371 Foot drop, right foot: Secondary | ICD-10-CM | POA: Diagnosis not present

## 2016-12-26 ENCOUNTER — Ambulatory Visit (INDEPENDENT_AMBULATORY_CARE_PROVIDER_SITE_OTHER): Payer: Medicare Other | Admitting: Pediatrics

## 2016-12-26 ENCOUNTER — Encounter: Payer: Self-pay | Admitting: Pediatrics

## 2016-12-26 VITALS — BP 115/65 | HR 60 | Temp 98.3°F | Ht 74.0 in | Wt 191.8 lb

## 2016-12-26 DIAGNOSIS — N3090 Cystitis, unspecified without hematuria: Secondary | ICD-10-CM

## 2016-12-26 DIAGNOSIS — R509 Fever, unspecified: Secondary | ICD-10-CM | POA: Diagnosis not present

## 2016-12-26 DIAGNOSIS — R399 Unspecified symptoms and signs involving the genitourinary system: Secondary | ICD-10-CM

## 2016-12-26 LAB — URINALYSIS, COMPLETE
Bilirubin, UA: NEGATIVE
GLUCOSE, UA: NEGATIVE
KETONES UA: NEGATIVE
Nitrite, UA: NEGATIVE
SPEC GRAV UA: 1.015 (ref 1.005–1.030)
Urobilinogen, Ur: 1 mg/dL (ref 0.2–1.0)
pH, UA: 5.5 (ref 5.0–7.5)

## 2016-12-26 LAB — MICROSCOPIC EXAMINATION
Renal Epithel, UA: NONE SEEN /hpf
WBC, UA: >30 /hpf — AB (ref 0–?)

## 2016-12-26 MED ORDER — CIPROFLOXACIN HCL 500 MG PO TABS
500.0000 mg | ORAL_TABLET | Freq: Two times a day (BID) | ORAL | 0 refills | Status: AC
Start: 2016-12-26 — End: 2017-01-02

## 2016-12-26 NOTE — Progress Notes (Signed)
  Subjective:   Patient ID: [name], male    DOB: 06-07-1940, 76 y.o.   MRN: [id number] CC: Back Pain; Abdominal Pain (Lower); and Trouble urinating  HPI: [name] is a 76 y.o. male presenting for Back Pain; Abdominal Pain (Lower); and Trouble urinating  Had some chills at home yesterday, felt achy then very hot and sweaty Urinary frequency overnight last night Also urgency, has to go quickly then not much urine comes out +dysuria, some bladder spasms No groin pain, some R sided abd pain Appetite down Symptoms ongoing x 3 days No groin pain  Continues to have problems with foot drop R foot, not wearing foot brace all the time  Relevant past medical, surgical, family and social history reviewed. Allergies and medications reviewed and updated. History  Smoking Status  . Former Smoker  . Quit date: 05/09/1976  Smokeless Tobacco  . Never Used   ROS: Per HPI   Objective:    BP 115/65   Pulse 60   Temp 98.3 F (36.8 C) (Oral)   Ht 6\' 2"  (1.88 m)   Wt 191 lb 12.8 oz (87 kg)   BMI 24.63 kg/m   Wt Readings from Last 3 Encounters:  12/26/16 191 lb 12.8 oz (87 kg)  08/22/16 204 lb (92.5 kg)  08/10/16 198 lb (89.8 kg)    Gen: NAD, alert, cooperative with exam, NCAT EYES: EOMI, no conjunctival injection, or no icterus CV: NRRR, normal S1/S2, no murmur, distal pulses 2+ b/l Resp: CTABL, no wheezes, normal WOB Abd: +BS, soft, mildly tender with deep palpation R periumbilicus, ND. no guarding or organomegaly Ext: No edema, warm Neuro: Alert and oriented MSK: normal muscle bulk  Assessment & Plan:  [name] was seen today for back pain, abdominal pain and trouble urinating.  Diagnoses and all orders for this visit:  UTI symptoms -     Urinalysis, Complete -     Urine Culture  Cystitis UA+ with WBC and RBC Will treat with below given subjective fever yesterday F/u urine culture -     ciprofloxacin (CIPRO) 500 MG tablet; Take 1 tablet (500 mg total) by mouth 2 (two)  times daily.  Fever, unspecified fever cause   Follow up plan: Return if symptoms worsen or fail to improve. Assunta Found, MD [place]

## 2016-12-26 NOTE — Patient Instructions (Signed)
Follow up with PCP

## 2016-12-30 LAB — URINE CULTURE

## 2017-02-09 ENCOUNTER — Encounter: Payer: Self-pay | Admitting: Family Medicine

## 2017-02-09 ENCOUNTER — Ambulatory Visit (INDEPENDENT_AMBULATORY_CARE_PROVIDER_SITE_OTHER): Payer: Medicare Other | Admitting: Family Medicine

## 2017-02-09 VITALS — BP 129/70 | HR 49 | Temp 96.8°F | Ht 74.0 in | Wt 196.0 lb

## 2017-02-09 DIAGNOSIS — D1779 Benign lipomatous neoplasm of other sites: Secondary | ICD-10-CM | POA: Diagnosis not present

## 2017-02-09 DIAGNOSIS — Z23 Encounter for immunization: Secondary | ICD-10-CM

## 2017-02-09 DIAGNOSIS — E785 Hyperlipidemia, unspecified: Secondary | ICD-10-CM

## 2017-02-09 DIAGNOSIS — I10 Essential (primary) hypertension: Secondary | ICD-10-CM

## 2017-02-09 DIAGNOSIS — I2510 Atherosclerotic heart disease of native coronary artery without angina pectoris: Secondary | ICD-10-CM | POA: Diagnosis not present

## 2017-02-09 LAB — CBC WITH DIFFERENTIAL/PLATELET
BASOS: 0 %
Basophils Absolute: 0 10*3/uL (ref 0.0–0.2)
EOS (ABSOLUTE): 0.2 10*3/uL (ref 0.0–0.4)
EOS: 3 %
HEMATOCRIT: 43.9 % (ref 37.5–51.0)
Hemoglobin: 15.2 g/dL (ref 13.0–17.7)
Immature Grans (Abs): 0 10*3/uL (ref 0.0–0.1)
Immature Granulocytes: 0 %
LYMPHS ABS: 1.8 10*3/uL (ref 0.7–3.1)
Lymphs: 35 %
MCH: 30.6 pg (ref 26.6–33.0)
MCHC: 34.6 g/dL (ref 31.5–35.7)
MCV: 88 fL (ref 79–97)
MONOS ABS: 0.4 10*3/uL (ref 0.1–0.9)
Monocytes: 8 %
Neutrophils Absolute: 2.8 10*3/uL (ref 1.4–7.0)
Neutrophils: 54 %
Platelets: 218 10*3/uL (ref 150–379)
RBC: 4.97 x10E6/uL (ref 4.14–5.80)
RDW: 13.6 % (ref 12.3–15.4)
WBC: 5.2 10*3/uL (ref 3.4–10.8)

## 2017-02-09 LAB — CMP14+EGFR
A/G RATIO: 1.8 (ref 1.2–2.2)
ALT: 12 IU/L (ref 0–44)
AST: 13 IU/L (ref 0–40)
Albumin: 4.2 g/dL (ref 3.5–4.8)
Alkaline Phosphatase: 125 IU/L — ABNORMAL HIGH (ref 39–117)
BUN / CREAT RATIO: 21 (ref 10–24)
BUN: 25 mg/dL (ref 8–27)
Bilirubin Total: 0.8 mg/dL (ref 0.0–1.2)
CO2: 25 mmol/L (ref 20–29)
Calcium: 9.2 mg/dL (ref 8.6–10.2)
Chloride: 104 mmol/L (ref 96–106)
Creatinine, Ser: 1.19 mg/dL (ref 0.76–1.27)
GFR, EST AFRICAN AMERICAN: 69 mL/min/{1.73_m2} (ref >59)
GFR, EST NON AFRICAN AMERICAN: 59 mL/min/{1.73_m2} — AB (ref >59)
GLOBULIN, TOTAL: 2.3 g/dL (ref 1.5–4.5)
GLUCOSE: 96 mg/dL (ref 65–99)
POTASSIUM: 4.3 mmol/L (ref 3.5–5.2)
SODIUM: 142 mmol/L (ref 134–144)
TOTAL PROTEIN: 6.5 g/dL (ref 6.0–8.5)

## 2017-02-09 LAB — LIPID PANEL
CHOL/HDL RATIO: 4.9 ratio (ref 0.0–5.0)
Cholesterol, Total: 277 mg/dL — ABNORMAL HIGH (ref 100–199)
HDL: 57 mg/dL (ref >39)
LDL Calculated: 200 mg/dL — ABNORMAL HIGH (ref 0–99)
Triglycerides: 99 mg/dL (ref 0–149)
VLDL Cholesterol Cal: 20 mg/dL (ref 5–40)

## 2017-02-09 MED ORDER — NITROGLYCERIN 0.4 MG SL SUBL: 0.4000 mg | SUBLINGUAL_TABLET | SUBLINGUAL | 3 refills | Status: DC | PRN

## 2017-02-09 NOTE — Progress Notes (Signed)
Subjective:  Patient ID: [name], male    DOB: 1940-06-29  Age: 76 y.o. MRN: [id number]  CC: Hypertension (pt here today for routine follow up of his chronic medical conditions, no other concerns voiced.)   HPI [name] presents for  follow-up of hypertension. Patient has no history of headache chest pain or shortness of breath or recent cough. Patient also denies symptoms of TIA such as focal numbness or weakness. Patient doesn't check  blood pressure at home.. Patient denies side effects from medication. States taking it regularly.  Patient in for follow-up of elevated cholesterol. Doing well without complaints on current medication. Denies side effects of statin including myalgia and arthralgia and nausea. Also in today for liver function testing. Currently no chest pain, shortness of breath or other cardiovascular related symptoms noted.  Patient has a lipoma on the back of his neck that seems to be growing it's irritating because the size of its interfering with movement of his neck and it itches a lot. History [name] has a past medical history of Bradycardia; CAD in native artery; History of GI bleed; History of mitral valve prolapse; History of peptic ulcer disease; Hyperlipidemia, mixed; Hypertension; and MI (myocardial infarction) ([place]) (2000, 2004).   He has a past surgical history that includes Coronary stent placement (2004, 2007).   His family history includes Cancer in his daughter; Coronary artery disease in his other; Heart attack in his father and mother.He reports that he quit smoking about 40 years ago. He has never used smokeless tobacco. He reports that he does not drink alcohol or use drugs.  Current Outpatient Prescriptions on File Prior to Visit  Medication Sig Dispense Refill  . aspirin 81 MG tablet Take 81 mg by mouth daily.      [name] atorvastatin (LIPITOR) 80 MG tablet Take 1 tablet (80 mg total) by mouth daily. 90 tablet 3  . metoprolol succinate  (TOPROL-XL) 50 MG 24 hr tablet Take 1 tablet (50 mg total) by mouth daily. 90 tablet 3   No current facility-administered medications on file prior to visit.     ROS Review of Systems  Constitutional: Negative for chills, diaphoresis, fever and unexpected weight change.  HENT: Negative for congestion, hearing loss, rhinorrhea and sore throat.   Eyes: Negative for visual disturbance.  Respiratory: Negative for cough and shortness of breath.   Cardiovascular: Negative for chest pain.  Gastrointestinal: Negative for abdominal pain, constipation and diarrhea.  Genitourinary: Negative for dysuria and flank pain.  Musculoskeletal: Negative for arthralgias and joint swelling.  Skin: Negative for rash.  Neurological: Negative for dizziness and headaches.  Psychiatric/Behavioral: Negative for dysphoric mood and sleep disturbance.    Objective:  BP 129/70   Pulse (!) 49   Temp (!) 96.8 F (36 C) (Oral)   Ht 6' 2" (1.88 m)   Wt 196 lb (88.9 kg)   BMI 25.16 kg/m   BP Readings from Last 3 Encounters:  02/09/17 129/70  12/26/16 115/65  08/22/16 96/62    Wt Readings from Last 3 Encounters:  02/09/17 196 lb (88.9 kg)  12/26/16 191 lb 12.8 oz (87 kg)  08/22/16 204 lb (92.5 kg)     Physical Exam  Constitutional: He is oriented to person, place, and time. He appears well-developed and well-nourished. No distress.  HENT:  Head: Normocephalic and atraumatic.  Right Ear: External ear normal.  Left Ear: External ear normal.  Nose: Nose normal.  Mouth/Throat: Oropharynx is clear and moist.  Eyes:  Pupils are equal, round, and reactive to light. Conjunctivae and EOM are normal.  Neck: Normal range of motion. Neck supple. No thyromegaly present.  There is a golf ball size mass that is protruding from the posterior neck. It is freely movable under the skin it is somewhat spongy to rubbery. It is not fluctuant. It is nontender. There is no surrounding erythema.  Cardiovascular: Normal rate,  regular rhythm and normal heart sounds.   No murmur heard. Pulmonary/Chest: Effort normal and breath sounds normal. No respiratory distress. He has no wheezes. He has no rales.  Abdominal: Soft. Bowel sounds are normal. He exhibits no distension. There is no tenderness.  Lymphadenopathy:    He has no cervical adenopathy.  Neurological: He is alert and oriented to person, place, and time. He has normal reflexes.  Skin: Skin is warm and dry.  Psychiatric: He has a normal mood and affect. His behavior is normal. Judgment and thought content normal.      Assessment & Plan:   Jyaire was seen today for hypertension.  Diagnoses and all orders for this visit:  Essential hypertension, benign  Hyperlipidemia with target LDL less than 70 -     CBC with Differential/Platelet -     CMP14+EGFR -     Lipid panel  ASCVD (arteriosclerotic cardiovascular disease)  Lipoma of other specified sites -     Ambulatory referral to General Surgery  Other orders -     nitroGLYCERIN (NITROSTAT) 0.4 MG SL tablet; Place 1 tablet (0.4 mg total) under the tongue every 5 (five) minutes as needed for chest pain.   Allergies as of 02/09/2017      Reactions   Ibuprofen       Medication List       Accurate as of 02/09/17  8:59 AM. Always use your most recent med list.          aspirin 81 MG tablet Take 81 mg by mouth daily.   atorvastatin 80 MG tablet Commonly known as:  LIPITOR Take 1 tablet (80 mg total) by mouth daily.   metoprolol succinate 50 MG 24 hr tablet Commonly known as:  TOPROL-XL Take 1 tablet (50 mg total) by mouth daily.   nitroGLYCERIN 0.4 MG SL tablet Commonly known as:  NITROSTAT Place 1 tablet (0.4 mg total) under the tongue every 5 (five) minutes as needed for chest pain.       Meds ordered this encounter  Medications  . nitroGLYCERIN (NITROSTAT) 0.4 MG SL tablet    Sig: Place 1 tablet (0.4 mg total) under the tongue every 5 (five) minutes as needed for chest pain.     Dispense:  50 tablet    Refill:  3    Patient stable doing well on current regimen without signs of extension of his heart condition. Risk factors reviewed. Continue meds as is. Gen. surgery for referral made.  Follow-up: Return in about 6 months (around 08/10/2017).  Claretta Fraise, M.D.

## 2017-02-27 DIAGNOSIS — D170 Benign lipomatous neoplasm of skin and subcutaneous tissue of head, face and neck: Secondary | ICD-10-CM | POA: Diagnosis not present

## 2017-03-07 ENCOUNTER — Encounter: Payer: Self-pay | Admitting: *Deleted

## 2017-03-27 ENCOUNTER — Telehealth: Payer: Self-pay | Admitting: Family Medicine

## 2017-03-27 NOTE — Telephone Encounter (Signed)
Faxed last o/v notes to Fax# [phone]. No echo/stress test on file recently shown.

## 2017-03-28 DIAGNOSIS — D170 Benign lipomatous neoplasm of skin and subcutaneous tissue of head, face and neck: Secondary | ICD-10-CM | POA: Diagnosis not present

## 2017-07-18 ENCOUNTER — Ambulatory Visit (INDEPENDENT_AMBULATORY_CARE_PROVIDER_SITE_OTHER): Payer: Medicare HMO | Admitting: *Deleted

## 2017-07-18 ENCOUNTER — Encounter: Payer: Self-pay | Admitting: Neurology

## 2017-07-18 ENCOUNTER — Encounter: Payer: Self-pay | Admitting: *Deleted

## 2017-07-18 VITALS — BP 120/69 | HR 54 | Ht 73.5 in | Wt 210.0 lb

## 2017-07-18 DIAGNOSIS — R0600 Dyspnea, unspecified: Secondary | ICD-10-CM

## 2017-07-18 DIAGNOSIS — R0609 Other forms of dyspnea: Secondary | ICD-10-CM

## 2017-07-18 DIAGNOSIS — Z0000 Encounter for general adult medical examination without abnormal findings: Secondary | ICD-10-CM

## 2017-07-18 DIAGNOSIS — R413 Other amnesia: Secondary | ICD-10-CM

## 2017-07-18 NOTE — Progress Notes (Addendum)
Subjective:   [name] is a 77 y.o. male who presents for a subsequent Medicare Annual Wellness Visit. [name] is married and lives at home with his wife. His wife's son lives with them as well. He had two daughters but one passed away soon after birth and the other at 60 with cancer. He does not have biological grandchildren but he has stepgrandchildren. 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  He is active in his church and enjoyed singing in the choir but is not able to now due to shortness of breath. He has a significant cardiac history but hasn't seen Dr Harl Bowie in a couple of years.   Review of Systems  Health is about the same as last year.   Cardiac Risk Factors include: advanced age (>75men, >74 women);hypertension;dyslipidemia;male gender;sedentary lifestyle. Shortness of breath on exertion.   Neuro: right foot drop. Has a brace but does not wear it all the time. Also has noticed a decline in memory. Would like to follow up on this.      Objective:    Today's Vitals   07/18/17 0849  BP: 120/69  Pulse: (!) 54  Weight: 210 lb (95.3 kg)  Height: 6' 1.5" (1.867 m)   Body mass index is 27.33 kg/m.  Advanced Directives 07/18/2017  Does Patient Have a Medical Advance Directive? No  Would patient like information on creating a medical advance directive? No - Patient declined    Current Medications (verified) Outpatient Encounter Medications as of 07/18/2017  Medication Sig  . aspirin 81 MG tablet Take 81 mg by mouth daily.    [name] atorvastatin (LIPITOR) 80 MG tablet Take 1 tablet (80 mg total) by mouth daily.  . metoprolol succinate (TOPROL-XL) 50 MG 24 hr tablet Take 1 tablet (50 mg total) by mouth daily.  . nitroGLYCERIN (NITROSTAT) 0.4 MG SL tablet Place 1 tablet (0.4 mg total) under the tongue every 5 (five) minutes as needed for chest pain.   No facility-administered encounter medications on file as of 07/18/2017.      Allergies (verified) Ibuprofen   History: Past Medical History:  Diagnosis Date  . Bradycardia   . CAD in native artery   . History of GI bleed   . History of mitral valve prolapse   . History of peptic ulcer disease   . Hyperlipidemia, mixed   . Hypertension   . MI (myocardial infarction) ([place]) 2000, 2004   Past Surgical History:  Procedure Laterality Date  . CORONARY STENT PLACEMENT  2004, 2007   5 in 2004, 1 in 2007   Family History  Problem Relation Age of Onset  . Heart attack Mother   . Heart attack Father   . Coronary artery disease Other   . Cancer Daughter    Social History   Socioeconomic History  . Marital status: Married    Spouse name: Not on file  . Number of children: 2  . Years of education: 9  . Highest education level: GED or equivalent  Social Needs  . Financial resource strain: Not hard at all  . Food insecurity - worry: Never true  . Food insecurity - inability: Never true  . Transportation needs - medical: No  . Transportation needs - non-medical: No  Occupational History  . Occupation: Retired in 2006  Tobacco Use  . Smoking status: Former Smoker    Last attempt to quit: 05/09/1976    Years since  quitting: 41.2  . Smokeless tobacco: Never Used  Substance and Sexual Activity  . Alcohol use: No  . Drug use: No  . Sexual activity: Not Currently  Other Topics Concern  . Not on file  Social History Narrative   Married   Disabled   Exercises regularly   Clinical Intake:     Pain : No/denies pain     Nutritional Status: BMI of 19-24  Normal Diabetes: No  What is the last grade level you completed in school?: GED     Information entered by :: Chong Sicilian, RN  Activities of Daily Living In your present state of health, do you have any difficulty performing the following activities: 07/18/2017  Hearing? N  Vision? N  Difficulty concentrating or making decisions? Y  Comment Has noticed some decreases in memory  Walking  or climbing stairs? Y  Comment Right foot drop  Dressing or bathing? N  Doing errands, shopping? N  Preparing Food and eating ? N  Using the Toilet? N  In the past six months, have you accidently leaked urine? N  Do you have problems with loss of bowel control? N  Managing your Medications? N  Managing your Finances? N  Housekeeping or managing your Housekeeping? N  Some recent data might be hidden     Immunizations and Health Maintenance Immunization History  Administered Date(s) Administered  . Influenza, High Dose Seasonal PF 02/09/2017  . Pneumococcal Conjugate-13 07/23/2014  . Pneumococcal Polysaccharide-23 07/30/2011   Health Maintenance Due  Topic Date Due  . TETANUS/TDAP  08/26/1960  . COLONOSCOPY  08/27/1991    Patient Care Team: Claretta Fraise, MD as PCP - General (Family Medicine)  No hospitalizations, ER visits, or surgeries this past year.      Assessment:   This is a routine wellness examination for [name].  Hearing/Vision screen No deficits noted during visit.   Dietary issues and exercise activities discussed: Current Exercise Habits: The patient does not participate in regular exercise at present, Exercise limited by: orthopedic condition(s)(right foot drop)  Goals    . Exercise 150 min/wk Moderate Activity    . Prevent falls     Move carefully and slowly. Wear your foot brace.       Depression Screen PHQ 2/9 Scores 07/18/2017 02/09/2017 12/26/2016 08/22/2016  PHQ - 2 Score 0 0 0 0    Fall Risk Fall Risk  07/18/2017 02/09/2017 12/26/2016 08/22/2016 08/10/2016  Falls in the past year? Yes Yes Yes No Yes  Number falls in past yr: 2 or more - 2 or more - 2 or more  Injury with Fall? No No No - No  Comment - - - - -  Risk Factor Category  - High Fall Risk High Fall Risk - High Fall Risk  Risk for fall due to : History of fall(s);Impaired balance/gait;Impaired mobility History of fall(s);Impaired mobility History of fall(s);Impaired mobility - Impaired  balance/gait  Follow up Falls prevention discussed - - - -  Cognitive Function: MMSE - Mini Mental State Exam 07/18/2017 02/24/2016  Orientation to time 5 5  Orientation to Place 5 5  Registration 3 3  Attention/ Calculation 5 5  Recall 2 3  Language- name 2 objects 2 2  Language- repeat 1 1  Language- follow 3 step command 3 3  Language- read & follow direction 1 1  Write a sentence 1 1  Copy design 0 1  Total score 28 30  essentially normal but patient has concerns about his  memory      Screening Tests Health Maintenance  Topic Date Due  . TETANUS/TDAP  08/26/1960  . COLONOSCOPY  08/27/1991  . INFLUENZA VACCINE  Completed  . PNA vac Low Risk Adult  Completed       Plan:  Neuro referral to discuss memory loss Cardio referral with Dr Harl Bowie to follow up and discuss worsening SOB with exertion Move carefully to avoid falls. Stressed to wear brace for foot drop I would like for you to increase your activity level as tolerated, but wait until after cardiac eval.  Keep f/u with PCP   I have personally reviewed and noted the following in the patient's chart:   . Medical and social history . Use of alcohol, tobacco or illicit drugs  . Current medications and supplements . Functional ability and status . Nutritional status . Physical activity . Advanced directives . List of other physicians . Hospitalizations, surgeries, and ER visits in previous 12 months . Vitals . Screenings to include cognitive, depression, and falls . Referrals and appointments  In addition, I have reviewed and discussed with patient certain preventive protocols, quality metrics, and best practice recommendations. A written personalized care plan for preventive services as well as general preventive health recommendations were provided to patient.     Chong Sicilian, RN  07/18/2017    I have reviewed and agree with the above AWV documentation.  Claretta Fraise, M.D.

## 2017-07-18 NOTE — Patient Instructions (Signed)
  [name] , Thank you for taking time to come for your Medicare Wellness Visit. I appreciate your ongoing commitment to your health goals. Please review the following plan we discussed and let me know if I can assist you in the future.   These are the goals we discussed: Goals    . Exercise 150 min/wk Moderate Activity    . Prevent falls     Move carefully and slowly. Wear your foot brace.        This is a list of the screening recommended for you and due dates:  Health Maintenance  Topic Date Due  . Tetanus Vaccine  08/26/1960  . Colon Cancer Screening  08/27/1991  . Flu Shot  Completed  . Pneumonia vaccines  Completed   I would like for you to increase your activity level gradually but I would like for you to see your cardiologist for an evaluation first.

## 2017-08-11 ENCOUNTER — Ambulatory Visit (INDEPENDENT_AMBULATORY_CARE_PROVIDER_SITE_OTHER): Payer: Medicare HMO

## 2017-08-11 ENCOUNTER — Ambulatory Visit (INDEPENDENT_AMBULATORY_CARE_PROVIDER_SITE_OTHER): Payer: Medicare HMO | Admitting: Family Medicine

## 2017-08-11 ENCOUNTER — Encounter: Payer: Self-pay | Admitting: Family Medicine

## 2017-08-11 VITALS — BP 130/79 | HR 64 | Temp 97.1°F | Ht 73.5 in | Wt 211.4 lb

## 2017-08-11 DIAGNOSIS — I2510 Atherosclerotic heart disease of native coronary artery without angina pectoris: Secondary | ICD-10-CM | POA: Diagnosis not present

## 2017-08-11 DIAGNOSIS — R0600 Dyspnea, unspecified: Secondary | ICD-10-CM

## 2017-08-11 DIAGNOSIS — J984 Other disorders of lung: Secondary | ICD-10-CM | POA: Diagnosis not present

## 2017-08-11 DIAGNOSIS — I10 Essential (primary) hypertension: Secondary | ICD-10-CM | POA: Diagnosis not present

## 2017-08-11 DIAGNOSIS — R601 Generalized edema: Secondary | ICD-10-CM

## 2017-08-11 DIAGNOSIS — Z8711 Personal history of peptic ulcer disease: Secondary | ICD-10-CM | POA: Diagnosis not present

## 2017-08-11 MED ORDER — TRAZODONE HCL 100 MG PO TABS: 100.0000 mg | ORAL_TABLET | Freq: Every day | ORAL | 5 refills | Status: DC

## 2017-08-11 MED ORDER — METOPROLOL SUCCINATE ER 50 MG PO TB24: 50.0000 mg | ORAL_TABLET | Freq: Every day | ORAL | 3 refills | Status: DC

## 2017-08-11 MED ORDER — ATORVASTATIN CALCIUM 80 MG PO TABS: 80.0000 mg | ORAL_TABLET | Freq: Every day | ORAL | 3 refills | Status: DC

## 2017-08-11 NOTE — Progress Notes (Signed)
 Subjective:  Patient ID: [name], male    DOB: 04/27/1941  Age: 76 y.o. MRN: [id number]  CC: Hypertension (pt here today for routine follow up of his chronic medical conditions and is also c/o sleeping problems)   HPI [name] presents for  follow-up of hypertension. Patient has no history of headache chest pain or shortness of breath or recent cough. Patient also denies symptoms of TIA such as focal numbness or weakness. Patient denies side effects from medication. States taking it regularly.  Patient reports swelling and increasing shortness of breath with exertion.  He says he feels tired during the day and has to take about an hour nap in the afternoon.  He notes that he awakens early and does not get back to sleep well.  More forgetful.  Also of note is that he has had 2 heart attacks in the past.  He realizes this may play into his current symptomatology. History [name] has a past medical history of Bradycardia, CAD in native artery, History of GI bleed, History of mitral valve prolapse, History of peptic ulcer disease, Hyperlipidemia, mixed, Hypertension, and MI (myocardial infarction) ([facility]) (2000, 2004).   He has a past surgical history that includes Coronary stent placement (2004, 2007).   His family history includes Cancer in his daughter; Coronary artery disease in his other; Heart attack in his father and mother.He reports that he quit smoking about 41 years ago. He has never used smokeless tobacco. He reports that he does not drink alcohol or use drugs.  Current Outpatient Medications on File Prior to Visit  Medication Sig Dispense Refill  . aspirin 81 MG tablet Take 81 mg by mouth daily.      . nitroGLYCERIN (NITROSTAT) 0.4 MG SL tablet Place 1 tablet (0.4 mg total) under the tongue every 5 (five) minutes as needed for chest pain. 50 tablet 3   No current facility-administered medications on file prior to visit.     ROS Review of Systems  Constitutional: Negative.    HENT: Negative.   Eyes: Negative for visual disturbance.  Respiratory: Positive for shortness of breath. Negative for cough.   Cardiovascular: Positive for leg swelling. Negative for chest pain.  Gastrointestinal: Negative for abdominal pain, diarrhea, nausea and vomiting.  Genitourinary: Negative for difficulty urinating.  Musculoskeletal: Negative for arthralgias and myalgias.  Skin: Negative for rash.  Neurological: Negative for headaches.  Psychiatric/Behavioral: Negative for sleep disturbance.    Objective:  BP 130/79   Pulse 64   Temp (!) 97.1 F (36.2 C) (Oral)   Ht 6' 1.5" (1.867 m)   Wt 211 lb 6 oz (95.9 kg)   BMI 27.51 kg/m   BP Readings from Last 3 Encounters:  08/11/17 130/79  07/18/17 120/69  02/09/17 129/70    Wt Readings from Last 3 Encounters:  08/11/17 211 lb 6 oz (95.9 kg)  07/18/17 210 lb (95.3 kg)  02/09/17 196 lb (88.9 kg)     Physical Exam  Constitutional: He is oriented to person, place, and time. He appears well-developed and well-nourished. No distress.  HENT:  Head: Normocephalic and atraumatic.  Right Ear: External ear normal.  Left Ear: External ear normal.  Nose: Nose normal.  Mouth/Throat: Oropharynx is clear and moist.  Eyes: Pupils are equal, round, and reactive to light. Conjunctivae and EOM are normal.  Neck: Normal range of motion. Neck supple. No thyromegaly present.  Cardiovascular: Normal rate, regular rhythm and normal heart sounds.  No murmur heard. Pulmonary/Chest: Effort   normal and breath sounds normal. No respiratory distress. He has no wheezes. He has no rales.  Abdominal: Soft. Bowel sounds are normal. There is no tenderness.  Musculoskeletal: Normal range of motion. He exhibits edema (Trace only). He exhibits no deformity.  Lymphadenopathy:    He has no cervical adenopathy.  Neurological: He is alert and oriented to person, place, and time. He has normal reflexes.  Skin: Skin is warm and dry.  Psychiatric: He has a  normal mood and affect. His behavior is normal. Judgment and thought content normal.      Assessment & Plan:   [name] was seen today for hypertension.  Diagnoses and all orders for this visit:  Essential hypertension, benign -     CBC with Differential/Platelet -     CMP14+EGFR  ASCVD (arteriosclerotic cardiovascular disease) -     Lipid panel  PUD, HX OF  Generalized edema -     Brain natriuretic peptide -     DG Chest 2 View; Future  Dyspnea, unspecified type -     Brain natriuretic peptide -     DG Chest 2 View; Future  Other orders -     atorvastatin (LIPITOR) 80 MG tablet; Take 1 tablet (80 mg total) by mouth daily. -     metoprolol succinate (TOPROL-XL) 50 MG 24 hr tablet; Take 1 tablet (50 mg total) by mouth daily. -     traZODone (DESYREL) 100 MG tablet; Take 1 tablet (100 mg total) by mouth at bedtime.   Allergies as of 08/11/2017      Reactions   Ibuprofen       Medication List        Accurate as of 08/11/17 11:59 PM. Always use your most recent med list.          aspirin 81 MG tablet Take 81 mg by mouth daily.   atorvastatin 80 MG tablet Commonly known as:  LIPITOR Take 1 tablet (80 mg total) by mouth daily.   metoprolol succinate 50 MG 24 hr tablet Commonly known as:  TOPROL-XL Take 1 tablet (50 mg total) by mouth daily.   nitroGLYCERIN 0.4 MG SL tablet Commonly known as:  NITROSTAT Place 1 tablet (0.4 mg total) under the tongue every 5 (five) minutes as needed for chest pain.   traZODone 100 MG tablet Commonly known as:  DESYREL Take 1 tablet (100 mg total) by mouth at bedtime.       Meds ordered this encounter  Medications  . atorvastatin (LIPITOR) 80 MG tablet    Sig: Take 1 tablet (80 mg total) by mouth daily.    Dispense:  90 tablet    Refill:  3  . metoprolol succinate (TOPROL-XL) 50 MG 24 hr tablet    Sig: Take 1 tablet (50 mg total) by mouth daily.    Dispense:  90 tablet    Refill:  3  . traZODone (DESYREL) 100 MG tablet      Sig: Take 1 tablet (100 mg total) by mouth at bedtime.    Dispense:  30 tablet    Refill:  5    I recommended that he try to phase out naps in the afternoon.  He would be okay to take a brief 15-minute nap at most.  We will add trazodone at bedtime to help him sleep.  He needs to reestablish his sleep cycle.  We will base treatment for the shortness of breath on the results of his chest x-ray and the peptide test  Follow-up: Return in about 6 months (around 02/10/2018), or if symptoms worsen or fail to improve.   , M.D. 

## 2017-08-11 NOTE — Patient Instructions (Signed)
DASH Eating Plan DASH stands for "Dietary Approaches to Stop Hypertension." The DASH eating plan is a healthy eating plan that has been shown to reduce high blood pressure (hypertension). It may also reduce your risk for type 2 diabetes, heart disease, and stroke. The DASH eating plan may also help with weight loss. What are tips for following this plan? General guidelines  Avoid eating more than 2,300 mg (milligrams) of salt (sodium) a day. If you have hypertension, you may need to reduce your sodium intake to 1,500 mg a day.  Limit alcohol intake to no more than 1 drink a day for nonpregnant women and 2 drinks a day for men. One drink equals 12 oz of beer, 5 oz of wine, or 1 oz of hard liquor.  Work with your health care provider to maintain a healthy body weight or to lose weight. Ask what an ideal weight is for you.  Get at least 30 minutes of exercise that causes your heart to beat faster (aerobic exercise) most days of the week. Activities may include walking, swimming, or biking.  Work with your health care provider or diet and nutrition specialist (dietitian) to adjust your eating plan to your individual calorie needs. Reading food labels  Check food labels for the amount of sodium per serving. Choose foods with less than 5 percent of the Daily Value of sodium. Generally, foods with less than 300 mg of sodium per serving fit into this eating plan.  To find whole grains, look for the word "whole" as the first word in the ingredient list. Shopping  Buy products labeled as "low-sodium" or "no salt added."  Buy fresh foods. Avoid canned foods and premade or frozen meals. Cooking  Avoid adding salt when cooking. Use salt-free seasonings or herbs instead of table salt or sea salt. Check with your health care provider or pharmacist before using salt substitutes.  Do not fry foods. Cook foods using healthy methods such as baking, boiling, grilling, and broiling instead.  Cook with  heart-healthy oils, such as olive, canola, soybean, or sunflower oil. Meal planning   Eat a balanced diet that includes: ? 5 or more servings of fruits and vegetables each day. At each meal, try to fill half of your plate with fruits and vegetables. ? Up to 6-8 servings of whole grains each day. ? Less than 6 oz of lean meat, poultry, or fish each day. A 3-oz serving of meat is about the same size as a deck of cards. One egg equals 1 oz. ? 2 servings of low-fat dairy each day. ? A serving of nuts, seeds, or beans 5 times each week. ? Heart-healthy fats. Healthy fats called Omega-3 fatty acids are found in foods such as flaxseeds and coldwater fish, like sardines, salmon, and mackerel.  Limit how much you eat of the following: ? Canned or prepackaged foods. ? Food that is high in trans fat, such as fried foods. ? Food that is high in saturated fat, such as fatty meat. ? Sweets, desserts, sugary drinks, and other foods with added sugar. ? Full-fat dairy products.  Do not salt foods before eating.  Try to eat at least 2 vegetarian meals each week.  Eat more home-cooked food and less restaurant, buffet, and fast food.  When eating at a restaurant, ask that your food be prepared with less salt or no salt, if possible. What foods are recommended? The items listed may not be a complete list. Talk with your dietitian about what   dietary choices are best for you. Grains Whole-grain or whole-wheat bread. Whole-grain or whole-wheat pasta. Brown rice. Oatmeal. Quinoa. Bulgur. Whole-grain and low-sodium cereals. Pita bread. Low-fat, low-sodium crackers. Whole-wheat flour tortillas. Vegetables Fresh or frozen vegetables (raw, steamed, roasted, or grilled). Low-sodium or reduced-sodium tomato and vegetable juice. Low-sodium or reduced-sodium tomato sauce and tomato paste. Low-sodium or reduced-sodium canned vegetables. Fruits All fresh, dried, or frozen fruit. Canned fruit in natural juice (without  added sugar). Meat and other protein foods Skinless chicken or turkey. Ground chicken or turkey. Pork with fat trimmed off. Fish and seafood. Egg whites. Dried beans, peas, or lentils. Unsalted nuts, nut butters, and seeds. Unsalted canned beans. Lean cuts of beef with fat trimmed off. Low-sodium, lean deli meat. Dairy Low-fat (1%) or fat-free (skim) milk. Fat-free, low-fat, or reduced-fat cheeses. Nonfat, low-sodium ricotta or cottage cheese. Low-fat or nonfat yogurt. Low-fat, low-sodium cheese. Fats and oils Soft margarine without trans fats. Vegetable oil. Low-fat, reduced-fat, or light mayonnaise and salad dressings (reduced-sodium). Canola, safflower, olive, soybean, and sunflower oils. Avocado. Seasoning and other foods Herbs. Spices. Seasoning mixes without salt. Unsalted popcorn and pretzels. Fat-free sweets. What foods are not recommended? The items listed may not be a complete list. Talk with your dietitian about what dietary choices are best for you. Grains Baked goods made with fat, such as croissants, muffins, or some breads. Dry pasta or rice meal packs. Vegetables Creamed or fried vegetables. Vegetables in a cheese sauce. Regular canned vegetables (not low-sodium or reduced-sodium). Regular canned tomato sauce and paste (not low-sodium or reduced-sodium). Regular tomato and vegetable juice (not low-sodium or reduced-sodium). Pickles. Olives. Fruits Canned fruit in a light or heavy syrup. Fried fruit. Fruit in cream or butter sauce. Meat and other protein foods Fatty cuts of meat. Ribs. Fried meat. Bacon. Sausage. Bologna and other processed lunch meats. Salami. Fatback. Hotdogs. Bratwurst. Salted nuts and seeds. Canned beans with added salt. Canned or smoked fish. Whole eggs or egg yolks. Chicken or turkey with skin. Dairy Whole or 2% milk, cream, and half-and-half. Whole or full-fat cream cheese. Whole-fat or sweetened yogurt. Full-fat cheese. Nondairy creamers. Whipped toppings.  Processed cheese and cheese spreads. Fats and oils Butter. Stick margarine. Lard. Shortening. Ghee. Bacon fat. Tropical oils, such as coconut, palm kernel, or palm oil. Seasoning and other foods Salted popcorn and pretzels. Onion salt, garlic salt, seasoned salt, table salt, and sea salt. Worcestershire sauce. Tartar sauce. Barbecue sauce. Teriyaki sauce. Soy sauce, including reduced-sodium. Steak sauce. Canned and packaged gravies. Fish sauce. Oyster sauce. Cocktail sauce. Horseradish that you find on the shelf. Ketchup. Mustard. Meat flavorings and tenderizers. Bouillon cubes. Hot sauce and Tabasco sauce. Premade or packaged marinades. Premade or packaged taco seasonings. Relishes. Regular salad dressings. Where to find more information:  National Heart, Lung, and Blood Institute: www.nhlbi.nih.gov  American Heart Association: www.heart.org Summary  The DASH eating plan is a healthy eating plan that has been shown to reduce high blood pressure (hypertension). It may also reduce your risk for type 2 diabetes, heart disease, and stroke.  With the DASH eating plan, you should limit salt (sodium) intake to 2,300 mg a day. If you have hypertension, you may need to reduce your sodium intake to 1,500 mg a day.  When on the DASH eating plan, aim to eat more fresh fruits and vegetables, whole grains, lean proteins, low-fat dairy, and heart-healthy fats.  Work with your health care provider or diet and nutrition specialist (dietitian) to adjust your eating plan to your individual   calorie needs. This information is not intended to replace advice given to you by your health care provider. Make sure you discuss any questions you have with your health care provider. Document Released: 04/14/2011 Document Revised: 04/18/2016 Document Reviewed: 04/18/2016 Elsevier Interactive Patient Education  2018 [employer].  

## 2017-08-12 LAB — LIPID PANEL
CHOL/HDL RATIO: 6.1 ratio — AB (ref 0.0–5.0)
Cholesterol, Total: 297 mg/dL — ABNORMAL HIGH (ref 100–199)
HDL: 49 mg/dL (ref >39)
LDL Calculated: 224 mg/dL — ABNORMAL HIGH (ref 0–99)
Triglycerides: 119 mg/dL (ref 0–149)
VLDL CHOLESTEROL CAL: 24 mg/dL (ref 5–40)

## 2017-08-12 LAB — CMP14+EGFR
A/G RATIO: 1.7 (ref 1.2–2.2)
ALBUMIN: 4.3 g/dL (ref 3.5–4.8)
ALT: 15 IU/L (ref 0–44)
AST: 16 IU/L (ref 0–40)
Alkaline Phosphatase: 154 IU/L — ABNORMAL HIGH (ref 39–117)
BILIRUBIN TOTAL: 0.8 mg/dL (ref 0.0–1.2)
BUN/Creatinine Ratio: 12 (ref 10–24)
BUN: 14 mg/dL (ref 8–27)
CHLORIDE: 103 mmol/L (ref 96–106)
CO2: 24 mmol/L (ref 20–29)
Calcium: 9.4 mg/dL (ref 8.6–10.2)
Creatinine, Ser: 1.21 mg/dL (ref 0.76–1.27)
GFR calc non Af Amer: 58 mL/min/{1.73_m2} — ABNORMAL LOW (ref >59)
GFR, EST AFRICAN AMERICAN: 67 mL/min/{1.73_m2} (ref >59)
GLOBULIN, TOTAL: 2.6 g/dL (ref 1.5–4.5)
GLUCOSE: 94 mg/dL (ref 65–99)
Potassium: 4.4 mmol/L (ref 3.5–5.2)
SODIUM: 141 mmol/L (ref 134–144)
TOTAL PROTEIN: 6.9 g/dL (ref 6.0–8.5)

## 2017-08-12 LAB — CBC WITH DIFFERENTIAL/PLATELET
Basophils Absolute: 0 x10E3/uL (ref 0.0–0.2)
Basos: 0 %
EOS (ABSOLUTE): 0.1 x10E3/uL (ref 0.0–0.4)
Eos: 2 %
Hematocrit: 43.5 % (ref 37.5–51.0)
Hemoglobin: 15.6 g/dL (ref 13.0–17.7)
Immature Grans (Abs): 0 x10E3/uL (ref 0.0–0.1)
Immature Granulocytes: 0 %
Lymphocytes Absolute: 1.9 x10E3/uL (ref 0.7–3.1)
Lymphs: 34 %
MCH: 30.5 pg (ref 26.6–33.0)
MCHC: 35.9 g/dL — ABNORMAL HIGH (ref 31.5–35.7)
MCV: 85 fL (ref 79–97)
Monocytes Absolute: 0.5 x10E3/uL (ref 0.1–0.9)
Monocytes: 9 %
Neutrophils Absolute: 3 x10E3/uL (ref 1.4–7.0)
Neutrophils: 55 %
Platelets: 288 x10E3/uL (ref 150–379)
RBC: 5.11 x10E6/uL (ref 4.14–5.80)
RDW: 13.3 % (ref 12.3–15.4)
WBC: 5.5 x10E3/uL (ref 3.4–10.8)

## 2017-08-12 LAB — BRAIN NATRIURETIC PEPTIDE: BNP: 22.4 pg/mL (ref 0.0–100.0)

## 2017-08-13 NOTE — Progress Notes (Signed)
Your chest x-ray looked normal. Thanks, WS.

## 2017-08-14 ENCOUNTER — Encounter: Payer: Self-pay | Admitting: Cardiology

## 2017-08-14 ENCOUNTER — Ambulatory Visit (INDEPENDENT_AMBULATORY_CARE_PROVIDER_SITE_OTHER): Payer: Medicare HMO | Admitting: Cardiology

## 2017-08-14 ENCOUNTER — Telehealth: Payer: Self-pay | Admitting: Cardiology

## 2017-08-14 ENCOUNTER — Other Ambulatory Visit: Payer: Self-pay

## 2017-08-14 VITALS — BP 134/86 | HR 58 | Ht 74.0 in | Wt 205.0 lb

## 2017-08-14 DIAGNOSIS — R0602 Shortness of breath: Secondary | ICD-10-CM

## 2017-08-14 DIAGNOSIS — I2510 Atherosclerotic heart disease of native coronary artery without angina pectoris: Secondary | ICD-10-CM

## 2017-08-14 DIAGNOSIS — I10 Essential (primary) hypertension: Secondary | ICD-10-CM

## 2017-08-14 NOTE — Telephone Encounter (Signed)
Pre-cert Verification for the following procedure   Echo scheduled for 08/15/17 at [facility]

## 2017-08-14 NOTE — Progress Notes (Signed)
Clinical Summary [name] is a 77 y.o.male seen for follow up of the following medical problems  1. CAD - multiple PCIs in the past - hx of MI in 2004, with stenting to RCA (ostium down to PDA). Of note had GI bleed 2 months later on DAPT and NSAIDs requiring transfusion.  - cath from 2007 with occluded LCX OM, received a BMS. RCA stents were patent at that time. LVEF 70% by LV gram at that time, no echoes in our system   - recent DOE and LE edema - 08/2017 CXR no acute process. BNP 22.  - DOE x 1 month. No chest pain. No recent edema. No coughing or wheezing. - DOE with working in his flower garden just a few days ago.  - compliant with meds  2. HTN - compliant withmeds  3. Hyperlipidemia - 08/2017 TC 297 TG 119 HDL 49 LDL 224 - mixed compliance with meds  4. Insomnia  manged by pcp     Past Medical History:  Diagnosis Date  . Bradycardia   . CAD in native artery   . History of GI bleed   . History of mitral valve prolapse   . History of peptic ulcer disease   . Hyperlipidemia, mixed   . Hypertension   . MI (myocardial infarction) ([place]) 2000, 2004     Allergies  Allergen Reactions  . Ibuprofen      Current Outpatient Medications  Medication Sig Dispense Refill  . aspirin 81 MG tablet Take 81 mg by mouth daily.      [name] atorvastatin (LIPITOR) 80 MG tablet Take 1 tablet (80 mg total) by mouth daily. 90 tablet 3  . metoprolol succinate (TOPROL-XL) 50 MG 24 hr tablet Take 1 tablet (50 mg total) by mouth daily. 90 tablet 3  . nitroGLYCERIN (NITROSTAT) 0.4 MG SL tablet Place 1 tablet (0.4 mg total) under the tongue every 5 (five) minutes as needed for chest pain. 50 tablet 3  . traZODone (DESYREL) 100 MG tablet Take 1 tablet (100 mg total) by mouth at bedtime. 30 tablet 5   No current facility-administered medications for this visit.      Past Surgical History:  Procedure Laterality Date  . CORONARY STENT PLACEMENT  2004, 2007   5 in 2004, 1 in 2007      Allergies  Allergen Reactions  . Ibuprofen       Family History  Problem Relation Age of Onset  . Heart attack Mother   . Heart attack Father   . Coronary artery disease Other   . Cancer Daughter      Social History [name] reports that he quit smoking about 41 years ago. He has never used smokeless tobacco. [name] reports that he does not drink alcohol.   Review of Systems CONSTITUTIONAL: No weight loss, fever, chills, weakness or fatigue.  HEENT: Eyes: No visual loss, blurred vision, double vision or yellow sclerae.No hearing loss, sneezing, congestion, runny nose or sore throat.  SKIN: No rash or itching.  CARDIOVASCULAR: per hpi RESPIRATORY: per hpi GASTROINTESTINAL: No anorexia, nausea, vomiting or diarrhea. No abdominal pain or blood.  GENITOURINARY: No burning on urination, no polyuria NEUROLOGICAL: No headache, dizziness, syncope, paralysis, ataxia, numbness or tingling in the extremities. No change in bowel or bladder control.  MUSCULOSKELETAL: No muscle, back pain, joint pain or stiffness.  LYMPHATICS: No enlarged nodes. No history of splenectomy.  PSYCHIATRIC: No history of depression or anxiety.  ENDOCRINOLOGIC: No reports  of sweating, cold or heat intolerance. No polyuria or polydipsia.  [name]   Physical Examination Vitals:   08/14/17 0821  BP: 134/86  Pulse: (!) 58  SpO2: 95%   Vitals:   08/14/17 0821  Weight: 205 lb (93 kg)  Height: 6\' 2"  (1.88 m)    Gen: resting comfortably, no acute distress HEENT: no scleral icterus, pupils equal round and reactive, no palptable cervical adenopathy,  CV: RRR, no m/r/g, no jvd Resp: Clear to auscultation bilaterally GI: abdomen is soft, non-tender, non-distended, normal bowel sounds, no hepatosplenomegaly MSK: extremities are warm, no edema.  Skin: warm, no rash Neuro:  no focal deficits Psych: appropriate affect   Diagnostic Studies Cath 2007 Central aortic pressure is 101/68 with a mean of 84.  LV pressure is 94/1  with an EDP of 6. There is no aortic stenosis.  Left main was normal.  LAD was a large vessel coursing to the apex. There was very prominent  severe ectasia in the proximal portion with aneurysmal dilatation and  swirling flow. However, no high grade stenosis. In the mid LAD, there was  a diffuse 40-50% segmental plaquing. In the distal LAD, there was a tubular  70-80% stenosis. There also were two diagonals. The first diagonal is a  medium vessel with a long 90% stenosis. The second diagonal was small with  branching vessel with a 70-80% proximal lesion.  The left circumflex was a moderate sized system. It gave off a large  branching OM-1 and a small OM-2. There is a 40% proximal lesion in the  circumflex. The lower branch of the OM was totally occluded and the distal  part of the vessel filled through collaterals with TIMI I flow. There is a  40% blockage in the upper portion of the OM.  The right coronary artery was a large dominant vessel. It gave off a  moderate sized PDA and two small PLs. There was evidence of previously  placed stent from the ostial portion down to the PDA. There was 40% ostial  stenosis with damping of the 6-French catheter. This did not change with  sublingual nitroglycerin. There is a 30% lesion in the proximal to mid  portion.  Left ventriculogram done the RAO position showed an EF of 70% with mild  mitral valve prolapse. There was no wall motion abnormalities and there was  no mitral regurgitation. Left ventriculogram done in the LAO position  showed an EF of 70% with no regional wall motion abnormalities.  Abdominal aortogram showed patent renal arteries bilaterally with no  abdominal aortic aneurysm.  A left subclavian done for possible bypass surgery showed a patent LIMA to  the chest wall with no obstructions in the subclavian.  ASSESSMENT:  1. Three vessel native coronary artery disease.  2. The LAD  has diffuse ectasia and swelling flow proximally as described  above. There is a borderline lesion in the distal LAD and a high grade  disease in the diagonal, but these do not appear to be the culprit  lesion.  3. Totally occluded OM1 which appears to be the culprit.  4. Patent right coronary stents.  5. Normal LV function with mild mitral valve prolapse but no significant  mitral regurgitation.  CONCLUSION: Successful PCI of a totally occluded circumflex marginal vessel  with improvement in narrowing from 100% to 0% and improvement in flow from  TIMI 0 to TIMI III flow using a bare metal stent.        Assessment and Plan  1. CAD - recent SOB/DOE - EKG in clinic SR without acute ischemic changes - we will iniitally obtain an echo, pending consider lexiscan MPI to evaluate for ischemia  2. HTN -at goal, continue current meds.   3. Hyperlipidemia - asked to increase his statin compliance, lipids have not been at goal.   F/u pending test results       Arnoldo Lenis, M.D

## 2017-08-14 NOTE — Patient Instructions (Signed)
Your physician recommends that you schedule a follow-up appointment in: TO [place]  Your physician recommends that you continue on your current medications as directed. Please refer to the Current Medication list given to you today.  Your physician has requested that you have an echocardiogram. Echocardiography is a painless test that uses sound waves to create images of your heart. It provides your doctor with information about the size and shape of your heart and how well your heart's chambers and valves are working. This procedure takes approximately one hour. There are no restrictions for this procedure.  Thank you for choosing [facility]!!

## 2017-08-15 ENCOUNTER — Ambulatory Visit (HOSPITAL_COMMUNITY)
Admission: RE | Admit: 2017-08-15 | Discharge: 2017-08-15 | Disposition: A | Discharge status: Home / Self Care | Payer: Medicare HMO | Source: Ambulatory Visit | Attending: Cardiology | Admitting: Cardiology

## 2017-08-15 DIAGNOSIS — I119 Hypertensive heart disease without heart failure: Secondary | ICD-10-CM | POA: Insufficient documentation

## 2017-08-15 DIAGNOSIS — I081 Rheumatic disorders of both mitral and tricuspid valves: Secondary | ICD-10-CM | POA: Diagnosis not present

## 2017-08-15 DIAGNOSIS — E785 Hyperlipidemia, unspecified: Secondary | ICD-10-CM | POA: Diagnosis not present

## 2017-08-15 DIAGNOSIS — R0602 Shortness of breath: Secondary | ICD-10-CM | POA: Insufficient documentation

## 2017-08-15 NOTE — Progress Notes (Signed)
*  PRELIMINARY RESULTS* Echocardiogram 2D Echocardiogram has been performed.  [name] 08/15/2017, 12:34 PM

## 2017-08-17 ENCOUNTER — Encounter: Payer: Self-pay | Admitting: Cardiology

## 2017-08-17 NOTE — Telephone Encounter (Signed)
Pt made aware of results. Order for stress test put in. Will send to front office to schedule.

## 2017-08-17 NOTE — Telephone Encounter (Signed)
-----   Message from Arnoldo Lenis, MD sent at 08/17/2017  2:08 PM EDT ----- Echo overall looks good. Based on his symptoms please order a lexiscan for sob. Does not need to hold meds   Zandra Abts MD

## 2017-08-23 NOTE — Telephone Encounter (Signed)
Pt called [name] office questioning his stress test. I returned his call. He stated that he is nervous of doing a stress test as his wife passed away after hers. He stated that at this time he does not want to have any further testing done. I will forward to Dr. Harl Bowie as an Juluis Rainier.

## 2017-09-05 DIAGNOSIS — I499 Cardiac arrhythmia, unspecified: Secondary | ICD-10-CM | POA: Diagnosis not present

## 2017-09-05 DIAGNOSIS — E785 Hyperlipidemia, unspecified: Secondary | ICD-10-CM | POA: Diagnosis not present

## 2017-09-05 DIAGNOSIS — G8929 Other chronic pain: Secondary | ICD-10-CM | POA: Diagnosis not present

## 2017-09-05 DIAGNOSIS — G629 Polyneuropathy, unspecified: Secondary | ICD-10-CM | POA: Diagnosis not present

## 2017-09-05 DIAGNOSIS — I252 Old myocardial infarction: Secondary | ICD-10-CM | POA: Diagnosis not present

## 2017-09-05 DIAGNOSIS — I10 Essential (primary) hypertension: Secondary | ICD-10-CM | POA: Diagnosis not present

## 2017-09-05 DIAGNOSIS — I25119 Atherosclerotic heart disease of native coronary artery with unspecified angina pectoris: Secondary | ICD-10-CM | POA: Diagnosis not present

## 2017-09-05 DIAGNOSIS — M1611 Unilateral primary osteoarthritis, right hip: Secondary | ICD-10-CM | POA: Diagnosis not present

## 2017-09-05 DIAGNOSIS — K08109 Complete loss of teeth, unspecified cause, unspecified class: Secondary | ICD-10-CM | POA: Diagnosis not present

## 2017-09-05 DIAGNOSIS — J309 Allergic rhinitis, unspecified: Secondary | ICD-10-CM | POA: Diagnosis not present

## 2017-10-04 ENCOUNTER — Other Ambulatory Visit: Payer: Medicare HMO

## 2017-10-04 ENCOUNTER — Ambulatory Visit: Payer: Medicare HMO | Admitting: Neurology

## 2017-10-04 ENCOUNTER — Other Ambulatory Visit: Payer: Self-pay

## 2017-10-04 ENCOUNTER — Encounter: Payer: Self-pay | Admitting: Neurology

## 2017-10-04 VITALS — BP 118/82 | HR 71 | Ht 74.5 in | Wt 203.0 lb

## 2017-10-04 DIAGNOSIS — G3184 Mild cognitive impairment, so stated: Secondary | ICD-10-CM

## 2017-10-04 NOTE — Progress Notes (Signed)
NEUROLOGY CONSULTATION NOTE  PROCTOR CARRIKER MRN: [id number] DOB: 1941-03-11  Referring provider: Dr. Claretta Fraise Primary care provider: Dr. Claretta Fraise  Reason for consult:  Memory loss  Dear Dr Livia Snellen:  Thank you for your kind referral of [name] for consultation of the above symptoms. Although his history is well known to you, please allow me to reiterate it for the purpose of our medical record. The patient was accompanied to the clinic by his wife who also provides collateral information. Records and images were personally reviewed where available.   HISTORY OF PRESENT ILLNESS: This is a 77 year old right-handed man with a history of hypertension, CAD s/p stents, right foot drop, presenting for evaluation of memory loss. He and his wife started noticing changes around a year ago. He feels he has a lost a lot of his memory and his mind is not as clear. He used to remember things really well, but now he would forget where he put things or what his wife asked him to get. He left the water running one time. He forgets words of familiar songs. Sometimes he would be driving and wonder where he is, especially unfamiliar roads. He denies missing medications. His wife is in charge of finances. He occasionally repeats himself. He would write things down, but forget where he put his notepad down. He is independent with dressing and bathing. No personality changes, paranoia, or hallucinations. No family history of dementia. He fell off a golf cart with brief loss of consciousness 14 years ago. No alcohol use. He had an MMSE at his PCP office in March 2019 which was normal, 28/30.  He has occasional dizziness on standing, his legs feel rubbery, and has had some falls. He also has chronic foot drop for the past 4-5 years with right calf atrophy, also causing falls. He reports having an evaluation in [place] with an EMG/NCV and did PT, but was told "nothing could be done." He has occasional  low back pain with a pinched nerve in his right leg going down the big toe. He has occasional tingling in his right arm and leg.[name] He has blurred vision but has not seen an eye doctor in 10 years. He occasionally gets strangled swallowing liquids and solids. He denies any headaches, dysarthria, neck pain, bowel/bladder dysfunction, anosmia, tremors.    PAST MEDICAL HISTORY: Past Medical History:  Diagnosis Date  . Bradycardia   . CAD in native artery   . History of GI bleed   . History of mitral valve prolapse   . History of peptic ulcer disease   . Hyperlipidemia, mixed   . Hypertension   . MI (myocardial infarction) ([place]) 2000, 2004    PAST SURGICAL HISTORY: Past Surgical History:  Procedure Laterality Date  . CORONARY STENT PLACEMENT  2004, 2007   5 in 2004, 1 in 2007    MEDICATIONS: Current Outpatient Medications on File Prior to Visit  Medication Sig Dispense Refill  . aspirin 81 MG tablet Take 81 mg by mouth daily.      [name] atorvastatin (LIPITOR) 80 MG tablet Take 1 tablet (80 mg total) by mouth daily. 90 tablet 3  . metoprolol succinate (TOPROL-XL) 50 MG 24 hr tablet Take 1 tablet (50 mg total) by mouth daily. 90 tablet 3  . nitroGLYCERIN (NITROSTAT) 0.4 MG SL tablet Place 1 tablet (0.4 mg total) under the tongue every 5 (five) minutes as needed for chest pain. 50 tablet 3  . traZODone (DESYREL)  100 MG tablet Take 1 tablet (100 mg total) by mouth at bedtime. 30 tablet 5   No current facility-administered medications on file prior to visit.     ALLERGIES: Allergies  Allergen Reactions  . Ibuprofen     FAMILY HISTORY: Family History  Problem Relation Age of Onset  . Heart attack Mother   . Heart attack Father   . Coronary artery disease Other   . Cancer Daughter     SOCIAL HISTORY: Social History   Socioeconomic History  . Marital status: Married    Spouse name: Not on file  . Number of children: 2  . Years of education: 9  . Highest education level: GED or  equivalent  Occupational History  . Occupation: Retired in 2006  Social Needs  . Financial resource strain: Not hard at all  . Food insecurity:    Worry: Never true    Inability: Never true  . Transportation needs:    Medical: No    Non-medical: No  Tobacco Use  . Smoking status: Former Smoker    Last attempt to quit: 05/09/1976    Years since quitting: 41.4  . Smokeless tobacco: Never Used  Substance and Sexual Activity  . Alcohol use: No  . Drug use: No  . Sexual activity: Not Currently  Lifestyle  . Physical activity:    Days per week: 0 days    Minutes per session: 0 min  . Stress: Only a little  Relationships  . Social connections:    Talks on phone: More than three times a week    Gets together: More than three times a week    Attends religious service: More than 4 times per year    Active member of club or organization: Not on file    Attends meetings of clubs or organizations: More than 4 times per year    Relationship status: Married  . Intimate partner violence:    Fear of current or ex partner: Not on file    Emotionally abused: Not on file    Physically abused: Not on file    Forced sexual activity: Not on file  Other Topics Concern  . Not on file  Social History Narrative   Married   Disabled   Exercises regularly    REVIEW OF SYSTEMS: Constitutional: No fevers, chills, or sweats, no generalized fatigue, change in appetite Eyes: No visual changes, double vision, eye pain Ear, nose and throat: No hearing loss, ear pain, nasal congestion, sore throat Cardiovascular: No chest pain, palpitations Respiratory:  No shortness of breath at rest or with exertion, wheezes GastrointestinaI: No nausea, vomiting, diarrhea, abdominal pain, fecal incontinence Genitourinary:  No dysuria, urinary retention or frequency Musculoskeletal:  No neck pain,+ back pain Integumentary: No rash, pruritus, skin lesions Neurological: as above Psychiatric: No depression, insomnia,  anxiety Endocrine: No palpitations, fatigue, diaphoresis, mood swings, change in appetite, change in weight, increased thirst Hematologic/Lymphatic:  No anemia, purpura, petechiae. Allergic/Immunologic: no itchy/runny eyes, nasal congestion, recent allergic reactions, rashes  PHYSICAL EXAM: Vitals:   10/04/17 0857  BP: 118/82  Pulse: 71  SpO2: 97%   General: No acute distress Head:  Normocephalic/atraumatic Eyes: Fundoscopic exam shows bilateral sharp discs, no vessel changes, exudates, or hemorrhages Neck: supple, no paraspinal tenderness, full range of motion Back: No paraspinal tenderness Heart: regular rate and rhythm Lungs: Clear to auscultation bilaterally. Vascular: No carotid bruits. Skin/Extremities: No rash, no edema Neurological Exam: Mental status: alert and oriented to person, place, and time,  no dysarthria or aphasia, Fund of knowledge is appropriate.  Recent and remote memory are intact.  Attention and concentration are normal.    Able to name objects and repeat phrases.  Montreal Cognitive Assessment  10/04/2017  Visuospatial/ Executive (0/5) 4  Naming (0/3) 3  Attention: Read list of digits (0/2) 2  Attention: Read list of letters (0/1) 1  Attention: Serial 7 subtraction starting at 100 (0/3) 3  Language: Repeat phrase (0/2) 2  Language : Fluency (0/1) 0  Abstraction (0/2) 2  Delayed Recall (0/5) 5  Orientation (0/6) 6  Total 28   Cranial nerves: CN I: not tested CN II: pupils equal, round and reactive to light, visual fields intact, fundi unremarkable. CN III, IV, VI:  full range of motion, no nystagmus, no ptosis CN V: facial sensation intact CN VII: upper and lower face symmetric CN VIII: hearing intact to finger rub CN IX, X: gag intact, uvula midline CN XI: sternocleidomastoid and trapezius muscles intact CN XII: tongue midline Bulk & Tone: right calf atrophy, no fasciculations. Motor: 5/5 on left UE and LE, right UE, 5/5 right hip flexion, 4/5 left  knee extension, 3/5 left knee flexion, inversion, plantarflexion, 0/5 dorsiflexion and eversion. No pronator drift. Sensation: intact to light touch, pin on both UE and LE, decreased cold on right UE, decreased vibration to ankles bilaterally. No extinction to double simultaneous stimulation.  Romberg test negative Deep Tendon Reflexes: +2 throughout except for absent ankle jerks bilaterally, no ankle clonus Plantar responses: downgoing bilaterally Cerebellar: no incoordination on finger to nose testing Gait: steppage gait due to right foot drop, unable to tandem walk Tremor: mild side to side head tremor, no postural or action tremor  IMPRESSION: This is a 77 year old right-handed man with a history of hypertension, CAD s/p stents, right foot drop, presenting for evaluation of memory loss. His neurological exam shows weakness affecting L4,L5, S1, he reports having an evaluation for this 4-5 years ago with EMG and had PT and wears a boot usually. MOCA score normal 28/30. Symptoms suggestive of mild cognitive impairment, there does not appear to be difficulties performing complex tasks. We discussed different causes of memory loss, check TSH, B12, RPR. MRI brain without contrast will be ordered to assess for underlying structural abnormality. We discussed cholinesterase inhibitors such as Donepezil, side effects and expectations from the medication, he would like to hold off for now and re-evaluate in 6 months. We discussed the importance of control of vascular risk factors, physical exercise, and brain stimulation exercises. He will follow-up in 6 months and knows to call for any changes.   Thank you for allowing me to participate in the care of this patient. Please do not hesitate to call for any questions or concerns.   Ellouise Newer, M.D.  CC: Dr. Livia Snellen

## 2017-10-04 NOTE — Patient Instructions (Addendum)
1. Bloodwork for TSH, B12, RPR  Your provider requests that you have LABS drawn today.  We share a lab with [facility] - they are located in [address] (second floor) of this building.  Once you get there, please have a seat and the phlebotomist will call your name.  If you have waited more than 15 minutes, please advise the front desk  2. Schedule MRI brain without contrast 3. Follow-up in 6 months, call for any changes   RECOMMENDATIONS FOR ALL PATIENTS WITH MEMORY PROBLEMS: 1. Continue to exercise (Recommend 30 minutes of walking everyday, or 3 hours every week) 2. Increase social interactions - continue going to [place] and enjoy social gatherings with friends and family 3. Eat healthy, avoid fried foods and eat more fruits and vegetables 4. Maintain adequate blood pressure, blood sugar, and blood cholesterol level. Reducing the risk of stroke and cardiovascular disease also helps promoting better memory. 5. Avoid stressful situations. Live a simple life and avoid aggravations. Organize your time and prepare for the next day in anticipation. 6. Sleep well, avoid any interruptions of sleep and avoid any distractions in the bedroom that may interfere with adequate sleep quality 7. Avoid sugar, avoid sweets as there is a strong link between excessive sugar intake, diabetes, and cognitive impairment We discussed the Mediterranean diet, which has been shown to help patients reduce the risk of progressive memory disorders and reduces cardiovascular risk. This includes eating fish, eat fruits and green leafy vegetables, nuts like almonds and hazelnuts, walnuts, and also use olive oil. Avoid fast foods and fried foods as much as possible. Avoid sweets and sugar as sugar use has been linked to worsening of memory function.

## 2017-10-05 LAB — TSH: TSH: 3.91 m[IU]/L (ref 0.40–4.50)

## 2017-10-05 LAB — RPR: RPR Ser Ql: NONREACTIVE

## 2017-10-05 LAB — VITAMIN B12: Vitamin B-12: 285 pg/mL (ref 200–1100)

## 2017-10-09 ENCOUNTER — Telehealth: Payer: Self-pay

## 2017-10-09 NOTE — Telephone Encounter (Signed)
Spoke with pt and his wife relaying message below.

## 2017-10-09 NOTE — Telephone Encounter (Signed)
-----   Message from Cameron Sprang, MD sent at 10/06/2017 12:54 PM EDT ----- Pls let patient and wife know the bloodwork looks fine, his B12 level is low normal 285, we usually want it at 400 when patients have neurological symptoms. Start daily vitamin B12 supplement 579mcg. Thanks

## 2018-02-12 ENCOUNTER — Ambulatory Visit (INDEPENDENT_AMBULATORY_CARE_PROVIDER_SITE_OTHER): Payer: Medicare HMO | Admitting: Family Medicine

## 2018-02-12 ENCOUNTER — Encounter: Payer: Self-pay | Admitting: Family Medicine

## 2018-02-12 VITALS — BP 114/63 | HR 75 | Temp 98.0°F | Ht 74.5 in | Wt 205.0 lb

## 2018-02-12 DIAGNOSIS — E785 Hyperlipidemia, unspecified: Secondary | ICD-10-CM

## 2018-02-12 DIAGNOSIS — I10 Essential (primary) hypertension: Secondary | ICD-10-CM | POA: Diagnosis not present

## 2018-02-12 DIAGNOSIS — I2510 Atherosclerotic heart disease of native coronary artery without angina pectoris: Secondary | ICD-10-CM

## 2018-02-12 DIAGNOSIS — G3184 Mild cognitive impairment, so stated: Secondary | ICD-10-CM | POA: Diagnosis not present

## 2018-02-12 MED ORDER — TRAZODONE HCL 100 MG PO TABS: 100.0000 mg | ORAL_TABLET | Freq: Every day | ORAL | 5 refills | Status: DC

## 2018-02-12 NOTE — Progress Notes (Signed)
Subjective:  Patient ID: [name], male    DOB: December 28, 1940  Age: 77 y.o. MRN: [id number]  CC: Medical Management of Chronic Issues   HPI [name] presents for  follow-up of hypertension. Patient has no history of headache chest pain or shortness of breath or recent cough. Patient also denies symptoms of TIA such as focal numbness or weakness.  Patient is vague with regard to use of his medication for hypertension saying that he is not taking it first but at some point that his blood pressure is well controlled by his medicine..  Patient in for follow-up of elevated cholesterol. Doing well without complaints on current medication.  However he admits to taking it irregularly.  He says he just does not need it because he trusts [facility] to meet his needs.  Denies side effects of statin including myalgia and arthralgia and nausea. Also in today for liver function testing. Currently no chest pain, shortness of breath or other cardiovascular related symptoms noted.  History [name] has a past medical history of Bradycardia, CAD in native artery, History of GI bleed, History of mitral valve prolapse, History of peptic ulcer disease, Hyperlipidemia, mixed, Hypertension, and MI (myocardial infarction) ([place]) (2000, 2004).   He has a past surgical history that includes Coronary stent placement (2004, 2007).   His family history includes Cancer in his daughter; Coronary artery disease in his other; Heart attack in his father and mother.He reports that he quit smoking about 41 years ago. He has never used smokeless tobacco. He reports that he does not drink alcohol or use drugs.  Current Outpatient Medications on File Prior to Visit  Medication Sig Dispense Refill  . aspirin 81 MG tablet Take 81 mg by mouth daily.      [name] atorvastatin (LIPITOR) 80 MG tablet Take 1 tablet (80 mg total) by mouth daily. 90 tablet 3  . metoprolol succinate (TOPROL-XL) 50 MG 24 hr tablet Take 1 tablet (50 mg total) by  mouth daily. 90 tablet 3  . nitroGLYCERIN (NITROSTAT) 0.4 MG SL tablet Place 1 tablet (0.4 mg total) under the tongue every 5 (five) minutes as needed for chest pain. 50 tablet 3   No current facility-administered medications on file prior to visit.     ROS Review of Systems  Constitutional: Negative.   HENT: Negative.   Eyes: Negative for visual disturbance.  Respiratory: Negative for cough and shortness of breath.   Cardiovascular: Negative for chest pain and leg swelling.  Gastrointestinal: Negative for abdominal pain, diarrhea, nausea and vomiting.  Genitourinary: Negative for difficulty urinating.  Musculoskeletal: Negative for arthralgias and myalgias.  Skin: Negative for rash.  Neurological: Negative for headaches.  Psychiatric/Behavioral: Negative for sleep disturbance.    Objective:  BP 114/63   Pulse 75   Temp 98 F (36.7 C) (Oral)   Ht 6' 2.5" (1.892 m)   Wt 205 lb (93 kg)   BMI 25.97 kg/m   BP Readings from Last 3 Encounters:  02/12/18 114/63  10/04/17 118/82  08/14/17 134/86    Wt Readings from Last 3 Encounters:  02/12/18 205 lb (93 kg)  10/04/17 203 lb (92.1 kg)  08/14/17 205 lb (93 kg)     Physical Exam  Constitutional: He is oriented to person, place, and time. He appears well-developed and well-nourished. No distress.  HENT:  Head: Normocephalic and atraumatic.  Right Ear: External ear normal.  Left Ear: External ear normal.  Nose: Nose normal.  Mouth/Throat: Oropharynx is clear  and moist.  Eyes: Pupils are equal, round, and reactive to light. Conjunctivae and EOM are normal.  Neck: Normal range of motion. Neck supple.  Cardiovascular: Normal rate, regular rhythm and normal heart sounds.  No murmur heard. Pulmonary/Chest: Effort normal and breath sounds normal. No respiratory distress. He has no wheezes. He has no rales.  Abdominal: Soft. There is no tenderness.  Musculoskeletal: Normal range of motion.  Neurological: He is alert and  oriented to person, place, and time. He has normal reflexes.  Skin: Skin is warm and dry.  Psychiatric: He has a normal mood and affect. His behavior is normal. Judgment and thought content normal.      Assessment & Plan:   Curry was seen today for medical management of chronic issues.  Diagnoses and all orders for this visit:  Essential hypertension, benign -     CMP14+EGFR -     CBC with Differential/Platelet  Hyperlipidemia with target LDL less than 70 -     CMP14+EGFR -     CBC with Differential/Platelet -     Lipid panel  ASCVD (arteriosclerotic cardiovascular disease) -     CMP14+EGFR -     CBC with Differential/Platelet  Other orders -     traZODone (DESYREL) 100 MG tablet; Take 1 tablet (100 mg total) by mouth at bedtime.   Allergies as of 02/12/2018      Reactions   Ibuprofen       Medication List        Accurate as of 02/12/18 12:38 PM. Always use your most recent med list.          aspirin 81 MG tablet Take 81 mg by mouth daily.   atorvastatin 80 MG tablet Commonly known as:  LIPITOR Take 1 tablet (80 mg total) by mouth daily.   metoprolol succinate 50 MG 24 hr tablet Commonly known as:  TOPROL-XL Take 1 tablet (50 mg total) by mouth daily.   nitroGLYCERIN 0.4 MG SL tablet Commonly known as:  NITROSTAT Place 1 tablet (0.4 mg total) under the tongue every 5 (five) minutes as needed for chest pain.   traZODone 100 MG tablet Commonly known as:  DESYREL Take 1 tablet (100 mg total) by mouth at bedtime.       Meds ordered this encounter  Medications  . traZODone (DESYREL) 100 MG tablet    Sig: Take 1 tablet (100 mg total) by mouth at bedtime.    Dispense:  30 tablet    Refill:  5    Follow-up: Return in about 1 year (around 02/13/2019).  Claretta Fraise, M.D.

## 2018-02-13 LAB — CBC WITH DIFFERENTIAL/PLATELET
BASOS ABS: 0 10*3/uL (ref 0.0–0.2)
Basos: 0 %
EOS (ABSOLUTE): 0.2 10*3/uL (ref 0.0–0.4)
Eos: 2 %
Hematocrit: 42.8 % (ref 37.5–51.0)
Hemoglobin: 14.6 g/dL (ref 13.0–17.7)
Immature Grans (Abs): 0 10*3/uL (ref 0.0–0.1)
Immature Granulocytes: 0 %
LYMPHS: 18 %
Lymphocytes Absolute: 1.4 10*3/uL (ref 0.7–3.1)
MCH: 29.9 pg (ref 26.6–33.0)
MCHC: 34.1 g/dL (ref 31.5–35.7)
MCV: 88 fL (ref 79–97)
Monocytes Absolute: 0.5 10*3/uL (ref 0.1–0.9)
Monocytes: 6 %
NEUTROS ABS: 5.8 10*3/uL (ref 1.4–7.0)
Neutrophils: 74 %
PLATELETS: 208 10*3/uL (ref 150–450)
RBC: 4.88 x10E6/uL (ref 4.14–5.80)
RDW: 12.8 % (ref 12.3–15.4)
WBC: 7.9 10*3/uL (ref 3.4–10.8)

## 2018-02-13 LAB — CMP14+EGFR
ALBUMIN: 4.1 g/dL (ref 3.5–4.8)
ALT: 14 IU/L (ref 0–44)
AST: 16 IU/L (ref 0–40)
Albumin/Globulin Ratio: 1.8 (ref 1.2–2.2)
Alkaline Phosphatase: 142 IU/L — ABNORMAL HIGH (ref 39–117)
BILIRUBIN TOTAL: 0.6 mg/dL (ref 0.0–1.2)
BUN/Creatinine Ratio: 12 (ref 10–24)
BUN: 14 mg/dL (ref 8–27)
CHLORIDE: 106 mmol/L (ref 96–106)
CO2: 25 mmol/L (ref 20–29)
Calcium: 8.8 mg/dL (ref 8.6–10.2)
Creatinine, Ser: 1.19 mg/dL (ref 0.76–1.27)
GFR calc Af Amer: 68 mL/min/{1.73_m2} (ref >59)
GFR calc non Af Amer: 59 mL/min/{1.73_m2} — ABNORMAL LOW (ref >59)
GLUCOSE: 82 mg/dL (ref 65–99)
Globulin, Total: 2.3 g/dL (ref 1.5–4.5)
POTASSIUM: 4.1 mmol/L (ref 3.5–5.2)
Sodium: 144 mmol/L (ref 134–144)
TOTAL PROTEIN: 6.4 g/dL (ref 6.0–8.5)

## 2018-02-13 LAB — LIPID PANEL
CHOLESTEROL TOTAL: 280 mg/dL — AB (ref 100–199)
Chol/HDL Ratio: 6 ratio — ABNORMAL HIGH (ref 0.0–5.0)
HDL: 47 mg/dL (ref >39)
LDL Calculated: 207 mg/dL — ABNORMAL HIGH (ref 0–99)
Triglycerides: 128 mg/dL (ref 0–149)
VLDL CHOLESTEROL CAL: 26 mg/dL (ref 5–40)

## 2018-05-11 ENCOUNTER — Encounter: Payer: Self-pay | Admitting: Neurology

## 2018-05-11 ENCOUNTER — Other Ambulatory Visit: Payer: Self-pay

## 2018-05-11 ENCOUNTER — Ambulatory Visit: Payer: Medicare HMO | Admitting: Neurology

## 2018-05-11 VITALS — BP 142/86 | HR 56 | Ht 74.5 in | Wt 207.0 lb

## 2018-05-11 DIAGNOSIS — M21371 Foot drop, right foot: Secondary | ICD-10-CM | POA: Diagnosis not present

## 2018-05-11 DIAGNOSIS — G3184 Mild cognitive impairment, so stated: Secondary | ICD-10-CM | POA: Diagnosis not present

## 2018-05-11 NOTE — Patient Instructions (Signed)
1. Recommend using your foot brace to help prevent falls. Would have your wife do the driving at this point 2. Follow-up in 6 months or so, call for any changes  FALL PRECAUTIONS: Be cautious when walking. Scan the area for obstacles that may increase the risk of trips and falls. When getting up in the mornings, sit up at the edge of the bed for a few minutes before getting out of bed. Consider elevating the bed at the head end to avoid drop of blood pressure when getting up. Walk always in a well-lit room (use night lights in the walls). Avoid area rugs or power cords from appliances in the middle of the walkways. Use a walker or a cane if necessary and consider physical therapy for balance exercise. Get your eyesight checked regularly.   RECOMMENDATIONS FOR ALL PATIENTS WITH MEMORY PROBLEMS: 1. Continue to exercise (Recommend 30 minutes of walking everyday, or 3 hours every week) 2. Increase social interactions - continue going to [place] and enjoy social gatherings with friends and family 3. Eat healthy, avoid fried foods and eat more fruits and vegetables 4. Maintain adequate blood pressure, blood sugar, and blood cholesterol level. Reducing the risk of stroke and cardiovascular disease also helps promoting better memory. 5. Avoid stressful situations. Live a simple life and avoid aggravations. Organize your time and prepare for the next day in anticipation. 6. Sleep well, avoid any interruptions of sleep and avoid any distractions in the bedroom that may interfere with adequate sleep quality 7. Avoid sugar, avoid sweets as there is a strong link between excessive sugar intake, diabetes, and cognitive impairment The Mediterranean diet has been shown to help patients reduce the risk of progressive memory disorders and reduces cardiovascular risk. This includes eating fish, eat fruits and green leafy vegetables, nuts like almonds and hazelnuts, walnuts, and also use olive oil. Avoid fast foods and  fried foods as much as possible. Avoid sweets and sugar as sugar use has been linked to worsening of memory function.

## 2018-05-11 NOTE — Progress Notes (Signed)
NEUROLOGY FOLLOW UP OFFICE NOTE  MOISHY LADAY [id number] [birth year]  HISTORY OF PRESENT ILLNESS: I had the pleasure of seeing Grabiel Schmutz in follow-up in the neurology clinic on 05/11/2018.  The patient was last seen 7 months for worsening memory. MOCA score in May 2019 was 28/30. He is again accompanied by his wife who helps supplement the today.  Records and images were personally reviewed where available.  He has not yet done MRI brain. TSH normal, B12 low normal 285, he was instructed to take a daily vitamin B12 supplement. Since his last visit, he and his wife report that memory has been stable. His wife does majority of driving, he denies getting lost when he drives to pay bills. They deny missing medications or bills. He mostly notices difficulty remembering where he put things, such as his glasses or if he got a drink and puts it down. He is independent with dressing and bathing. Mood is good, no paranoia or hallucinations. He has a right foot drop and has an AFO which he does not wear consistently, leading to a few falls without significant injuries. He has noticed that he sometimes presses on the gas pedal too hard and the car revs up. He denies any headaches, dizziness, vision changes.   History on Initial Assessment 10/04/2017: This is a 78 year old right-handed man with a history of hypertension, CAD s/p stents, right foot drop, presenting for evaluation of memory loss. He and his wife started noticing changes around a year ago. He feels he has a lost a lot of his memory and his mind is not as clear. He used to remember things really well, but now he would forget where he put things or what his wife asked him to get. He left the water running one time. He forgets words of familiar songs. Sometimes he would be driving and wonder where he is, especially unfamiliar roads. He denies missing medications. His wife is in charge of finances. He occasionally repeats himself. He would write things down,  but forget where he put his notepad down. He is independent with dressing and bathing. No personality changes, paranoia, or hallucinations. No family history of dementia. He fell off a golf cart with brief loss of consciousness 14 years ago. No alcohol use. He had an MMSE at his PCP office in March 2019 which was normal, 28/30.  He has occasional dizziness on standing, his legs feel rubbery, and has had some falls. He also has chronic foot drop for the past 4-5 years with right calf atrophy, also causing falls. He reports having an evaluation in [place] with an EMG/NCV and did PT, but was told "nothing could be done." He has occasional low back pain with a pinched nerve in his right leg going down the big toe. He has occasional tingling in his right arm and leg.[name] He has blurred vision but has not seen an eye doctor in 10 years. He occasionally gets strangled swallowing liquids and solids. He denies any headaches, dysarthria, neck pain, bowel/bladder dysfunction, anosmia, tremors.   Laboratory data: Lab Results  Component Value Date   TSH 3.91 10/04/2017   Lab Results  Component Value Date   VITAMINB12 285 10/04/2017     PAST MEDICAL HISTORY: Past Medical History:  Diagnosis Date  . Bradycardia   . CAD in native artery   . History of GI bleed   . History of mitral valve prolapse   . History of peptic ulcer disease   .  Hyperlipidemia, mixed   . Hypertension   . MI (myocardial infarction) ([place]) 2000, 2004    MEDICATIONS: Current Outpatient Medications on File Prior to Visit  Medication Sig Dispense Refill  . aspirin 81 MG tablet Take 81 mg by mouth daily.      [name] atorvastatin (LIPITOR) 80 MG tablet Take 1 tablet (80 mg total) by mouth daily. 90 tablet 3  . metoprolol succinate (TOPROL-XL) 50 MG 24 hr tablet Take 1 tablet (50 mg total) by mouth daily. 90 tablet 3  . nitroGLYCERIN (NITROSTAT) 0.4 MG SL tablet Place 1 tablet (0.4 mg total) under the tongue every 5 (five) minutes as  needed for chest pain. 50 tablet 3  . traZODone (DESYREL) 100 MG tablet Take 1 tablet (100 mg total) by mouth at bedtime. 30 tablet 5   No current facility-administered medications on file prior to visit.     ALLERGIES: Allergies  Allergen Reactions  . Ibuprofen     FAMILY HISTORY: Family History  Problem Relation Age of Onset  . Heart attack Mother   . Heart attack Father   . Coronary artery disease Other   . Cancer Daughter     SOCIAL HISTORY: Social History   Socioeconomic History  . Marital status: Married    Spouse name: Not on file  . Number of children: 2  . Years of education: 9  . Highest education level: GED or equivalent  Occupational History  . Occupation: Retired in 2006  Social Needs  . Financial resource strain: Not hard at all  . Food insecurity:    Worry: Never true    Inability: Never true  . Transportation needs:    Medical: No    Non-medical: No  Tobacco Use  . Smoking status: Former Smoker    Last attempt to quit: 05/09/1976    Years since quitting: 42.0  . Smokeless tobacco: Never Used  Substance and Sexual Activity  . Alcohol use: No  . Drug use: No  . Sexual activity: Not Currently  Lifestyle  . Physical activity:    Days per week: 0 days    Minutes per session: 0 min  . Stress: Only a little  Relationships  . Social connections:    Talks on phone: More than three times a week    Gets together: More than three times a week    Attends religious service: More than 4 times per year    Active member of club or organization: Not on file    Attends meetings of clubs or organizations: More than 4 times per year    Relationship status: Married  . Intimate partner violence:    Fear of current or ex partner: Not on file    Emotionally abused: Not on file    Physically abused: Not on file    Forced sexual activity: Not on file  Other Topics Concern  . Not on file  Social History Narrative   Married   Disabled   Exercises regularly     REVIEW OF SYSTEMS: Constitutional: No fevers, chills, or sweats, no generalized fatigue, change in appetite Eyes: No visual changes, double vision, eye pain Ear, nose and throat: No hearing loss, ear pain, nasal congestion, sore throat Cardiovascular: No chest pain, palpitations Respiratory:  No shortness of breath at rest or with exertion, wheezes GastrointestinaI: No nausea, vomiting, diarrhea, abdominal pain, fecal incontinence Genitourinary:  No dysuria, urinary retention or frequency Musculoskeletal:  No neck pain, back pain Integumentary: No rash, pruritus, skin lesions  Neurological: as above Psychiatric: No depression, insomnia, anxiety Endocrine: No palpitations, fatigue, diaphoresis, mood swings, change in appetite, change in weight, increased thirst Hematologic/Lymphatic:  No anemia, purpura, petechiae. Allergic/Immunologic: no itchy/runny eyes, nasal congestion, recent allergic reactions, rashes  PHYSICAL EXAM: Vitals:   05/11/18 0939  BP: (!) 142/86  Pulse: (!) 56  SpO2: 97%   General: No acute distress Head:  Normocephalic/atraumatic Neck: supple, no paraspinal tenderness, full range of motion Heart:  Regular rate and rhythm Lungs:  Clear to auscultation bilaterally Back: No paraspinal tenderness Skin/Extremities: No rash, no edema Neurological Exam: alert and oriented to person, place, and time. No aphasia or dysarthria. Fund of knowledge is appropriate.  Recent and remote memory are intact.  Attention and concentration are normal. He had more difficulty with serial 7s today.   Able to name objects and repeat phrases.  Montreal Cognitive Assessment  05/11/2018 10/04/2017  Visuospatial/ Executive (0/5) 5 4  Naming (0/3) 3 3  Attention: Read list of digits (0/2) 2 2  Attention: Read list of letters (0/1) 1 1  Attention: Serial 7 subtraction starting at 100 (0/3) 1 3  Language: Repeat phrase (0/2) 2 2  Language : Fluency (0/1) 0 0  Abstraction (0/2) 2 2  Delayed  Recall (0/5) 5 5  Orientation (0/6) 6 6  Total 27 28   Cranial nerves: CN I: not tested CN II: pupils equal, round and reactive to light, visual fields intact CN III, IV, VI:  full range of motion, no nystagmus, no ptosis CN V: facial sensation intact CN VII: upper and lower face symmetric CN VIII: hearing intact to conversation CN IX, X: gag intact, uvula midline CN XI: sternocleidomastoid and trapezius muscles intact CN XII: tongue midline Bulk & Tone: right calf atrophy, no cogwheeling, no fasciculations. Motor: 5/5 on left UE and LE, right UE, 5/5 right hip flexion, 4/5 left knee extension, 3/5 left knee flexion, inversion, plantarflexion, 0/5 dorsiflexion and eversion (similar to prior). No pronator drift. Sensation: intact to light touch Cerebellar: no incoordination on finger to nose testing Gait: steppage gait due to right foot drop, unable to tandem walk (similar to prior) Tremor: mild side to side head tremor, no postural or action tremor (similar to prior)  IMPRESSION: This is a 78 yo RH  man with a history of hypertension, CAD s/p stents, right foot drop, with mild cognitive impairment, MOCA today normal 27/30 (28/30 in May 2019). He and his wife report memory has been stable, no difficulties with complex tasks. He is not interested in starting cholinesterase inhibitors such as Donepezil at this time. We discussed his right foot drop, he reports this has been evaluated several years ago and he has an AFO, he was encouraged to wear the AFO more consistently. He also reports difficulties giving good pressure on the gas pedal, we discussed having his wife do the driving at this point. We again discussed the importance of control of vascular risk factors, physical exercise, and brain stimulation exercises. He will follow-up in 6 months and knows to call for any changes.   Thank you for allowing me to participate in his care.  Please do not hesitate to call for any questions or  concerns.  The duration of this appointment visit was 30 minutes of face-to-face time with the patient.  Greater than 50% of this time was spent in counseling, explanation of diagnosis, planning of further management, and coordination of care.   Ellouise Newer, M.D.   CC: Dr. Livia Snellen

## 2018-07-18 ENCOUNTER — Encounter (HOSPITAL_COMMUNITY): Payer: Self-pay

## 2018-07-18 ENCOUNTER — Ambulatory Visit (INDEPENDENT_AMBULATORY_CARE_PROVIDER_SITE_OTHER): Payer: Medicare HMO | Admitting: Physician Assistant

## 2018-07-18 ENCOUNTER — Emergency Department (HOSPITAL_COMMUNITY): Payer: Medicare HMO

## 2018-07-18 ENCOUNTER — Encounter: Payer: Self-pay | Admitting: Physician Assistant

## 2018-07-18 ENCOUNTER — Encounter (HOSPITAL_COMMUNITY)
Admission: EM | Disposition: A | Discharge status: Home / Self Care | Payer: Self-pay | Source: Home / Self Care | Attending: Cardiovascular Disease

## 2018-07-18 ENCOUNTER — Other Ambulatory Visit: Payer: Self-pay

## 2018-07-18 ENCOUNTER — Inpatient Hospital Stay (HOSPITAL_COMMUNITY): Payer: Medicare HMO

## 2018-07-18 ENCOUNTER — Inpatient Hospital Stay (HOSPITAL_COMMUNITY)
Admission: EM | Admit: 2018-07-18 | Discharge: 2018-07-21 | DRG: 246 | Disposition: A | Discharge status: Home / Self Care | Payer: Medicare HMO | Attending: Cardiovascular Disease | Admitting: Cardiovascular Disease

## 2018-07-18 VITALS — BP 130/77 | HR 58 | Temp 96.8°F | Resp 24

## 2018-07-18 DIAGNOSIS — I25110 Atherosclerotic heart disease of native coronary artery with unstable angina pectoris: Secondary | ICD-10-CM | POA: Diagnosis not present

## 2018-07-18 DIAGNOSIS — N179 Acute kidney failure, unspecified: Secondary | ICD-10-CM | POA: Diagnosis not present

## 2018-07-18 DIAGNOSIS — I959 Hypotension, unspecified: Secondary | ICD-10-CM | POA: Diagnosis not present

## 2018-07-18 DIAGNOSIS — Z8711 Personal history of peptic ulcer disease: Secondary | ICD-10-CM | POA: Diagnosis not present

## 2018-07-18 DIAGNOSIS — Y831 Surgical operation with implant of artificial internal device as the cause of abnormal reaction of the patient, or of later complication, without mention of misadventure at the time of the procedure: Secondary | ICD-10-CM | POA: Diagnosis present

## 2018-07-18 DIAGNOSIS — E782 Mixed hyperlipidemia: Secondary | ICD-10-CM | POA: Diagnosis present

## 2018-07-18 DIAGNOSIS — I214 Non-ST elevation (NSTEMI) myocardial infarction: Secondary | ICD-10-CM | POA: Diagnosis not present

## 2018-07-18 DIAGNOSIS — M25512 Pain in left shoulder: Secondary | ICD-10-CM | POA: Diagnosis not present

## 2018-07-18 DIAGNOSIS — Z8249 Family history of ischemic heart disease and other diseases of the circulatory system: Secondary | ICD-10-CM

## 2018-07-18 DIAGNOSIS — I2121 ST elevation (STEMI) myocardial infarction involving left circumflex coronary artery: Secondary | ICD-10-CM

## 2018-07-18 DIAGNOSIS — I2510 Atherosclerotic heart disease of native coronary artery without angina pectoris: Secondary | ICD-10-CM | POA: Diagnosis present

## 2018-07-18 DIAGNOSIS — Z79899 Other long term (current) drug therapy: Secondary | ICD-10-CM

## 2018-07-18 DIAGNOSIS — Z87891 Personal history of nicotine dependence: Secondary | ICD-10-CM

## 2018-07-18 DIAGNOSIS — I200 Unstable angina: Secondary | ICD-10-CM | POA: Diagnosis not present

## 2018-07-18 DIAGNOSIS — Z955 Presence of coronary angioplasty implant and graft: Secondary | ICD-10-CM

## 2018-07-18 DIAGNOSIS — E785 Hyperlipidemia, unspecified: Secondary | ICD-10-CM

## 2018-07-18 DIAGNOSIS — I472 Ventricular tachycardia: Secondary | ICD-10-CM | POA: Diagnosis not present

## 2018-07-18 DIAGNOSIS — N183 Chronic kidney disease, stage 3 (moderate): Secondary | ICD-10-CM | POA: Diagnosis present

## 2018-07-18 DIAGNOSIS — I5041 Acute combined systolic (congestive) and diastolic (congestive) heart failure: Secondary | ICD-10-CM | POA: Diagnosis present

## 2018-07-18 DIAGNOSIS — I252 Old myocardial infarction: Secondary | ICD-10-CM

## 2018-07-18 DIAGNOSIS — R001 Bradycardia, unspecified: Secondary | ICD-10-CM

## 2018-07-18 DIAGNOSIS — Z888 Allergy status to other drugs, medicaments and biological substances status: Secondary | ICD-10-CM

## 2018-07-18 DIAGNOSIS — R0602 Shortness of breath: Secondary | ICD-10-CM | POA: Diagnosis not present

## 2018-07-18 DIAGNOSIS — T82855A Stenosis of coronary artery stent, initial encounter: Secondary | ICD-10-CM | POA: Diagnosis present

## 2018-07-18 DIAGNOSIS — I10 Essential (primary) hypertension: Secondary | ICD-10-CM

## 2018-07-18 DIAGNOSIS — I080 Rheumatic disorders of both mitral and aortic valves: Secondary | ICD-10-CM | POA: Diagnosis not present

## 2018-07-18 DIAGNOSIS — I361 Nonrheumatic tricuspid (valve) insufficiency: Secondary | ICD-10-CM | POA: Diagnosis not present

## 2018-07-18 DIAGNOSIS — I130 Hypertensive heart and chronic kidney disease with heart failure and stage 1 through stage 4 chronic kidney disease, or unspecified chronic kidney disease: Secondary | ICD-10-CM | POA: Diagnosis present

## 2018-07-18 DIAGNOSIS — N170 Acute kidney failure with tubular necrosis: Secondary | ICD-10-CM | POA: Diagnosis not present

## 2018-07-18 DIAGNOSIS — R000 Tachycardia, unspecified: Secondary | ICD-10-CM | POA: Diagnosis not present

## 2018-07-18 DIAGNOSIS — R079 Chest pain, unspecified: Secondary | ICD-10-CM | POA: Diagnosis not present

## 2018-07-18 HISTORY — DX: Foot drop, right foot: M21.371

## 2018-07-18 HISTORY — DX: ST elevation (STEMI) myocardial infarction involving left circumflex coronary artery: I21.21

## 2018-07-18 HISTORY — PX: LEFT HEART CATH AND CORONARY ANGIOGRAPHY: CATH118249

## 2018-07-18 HISTORY — DX: Myoneural disorder, unspecified: G70.9

## 2018-07-18 HISTORY — PX: CORONARY/GRAFT ACUTE MI REVASCULARIZATION: CATH118305

## 2018-07-18 HISTORY — PX: CORONARY STENT INTERVENTION: CATH118234

## 2018-07-18 LAB — ECHOCARDIOGRAM COMPLETE
Height: 74 in
Weight: 2960 oz

## 2018-07-18 LAB — CBC
HCT: 44.5 % (ref 39.0–52.0)
Hemoglobin: 14.3 g/dL (ref 13.0–17.0)
MCH: 29.3 pg (ref 26.0–34.0)
MCHC: 32.1 g/dL (ref 30.0–36.0)
MCV: 91.2 fL (ref 80.0–100.0)
Platelets: 243 10*3/uL (ref 150–400)
RBC: 4.88 MIL/uL (ref 4.22–5.81)
RDW: 13.3 % (ref 11.5–15.5)
WBC: 7 10*3/uL (ref 4.0–10.5)
nRBC: 0 % (ref 0.0–0.2)

## 2018-07-18 LAB — BASIC METABOLIC PANEL
Anion gap: 7 (ref 5–15)
Anion gap: 14 (ref 5–15)
BUN: 18 mg/dL (ref 8–23)
BUN: 17 mg/dL (ref 8–23)
CO2: 26 mmol/L (ref 22–32)
CO2: 20 mmol/L — ABNORMAL LOW (ref 22–32)
CREATININE: 1.36 mg/dL — AB (ref 0.61–1.24)
Calcium: 9.3 mg/dL (ref 8.9–10.3)
Calcium: 9.4 mg/dL (ref 8.9–10.3)
Chloride: 106 mmol/L (ref 98–111)
Chloride: 105 mmol/L (ref 98–111)
Creatinine, Ser: 1.41 mg/dL — ABNORMAL HIGH (ref 0.61–1.24)
GFR calc Af Amer: 58 mL/min — ABNORMAL LOW (ref >60)
GFR calc Af Amer: 56 mL/min — ABNORMAL LOW (ref >60)
GFR calc non Af Amer: 50 mL/min — ABNORMAL LOW (ref >60)
GFR calc non Af Amer: 48 mL/min — ABNORMAL LOW (ref >60)
Glucose, Bld: 117 mg/dL — ABNORMAL HIGH (ref 70–99)
Glucose, Bld: 130 mg/dL — ABNORMAL HIGH (ref 70–99)
Potassium: 4.2 mmol/L (ref 3.5–5.1)
Potassium: 3.9 mmol/L (ref 3.5–5.1)
Sodium: 139 mmol/L (ref 135–145)
Sodium: 139 mmol/L (ref 135–145)

## 2018-07-18 LAB — I-STAT TROPONIN, ED: Troponin i, poc: 0.26 ng/mL (ref 0.00–0.08)

## 2018-07-18 LAB — TROPONIN I
TROPONIN I: 36.7 ng/mL — AB (ref <0.03)
Troponin I: >65 ng/mL (ref <0.03)

## 2018-07-18 LAB — POCT ACTIVATED CLOTTING TIME
Activated Clotting Time: 241 seconds
Activated Clotting Time: 362 seconds

## 2018-07-18 LAB — MRSA PCR SCREENING: MRSA by PCR: NEGATIVE

## 2018-07-18 IMAGING — DX PORTABLE CHEST - 1 VIEW
1 series · 1 of 1 positions shown · non-contrast
Comparison: Chest radiograph performed [DATE]

CLINICAL DATA: Acute onset of generalized chest pain, radiating to
the left arm.

EXAM:
PORTABLE CHEST 1 VIEW

[chest]
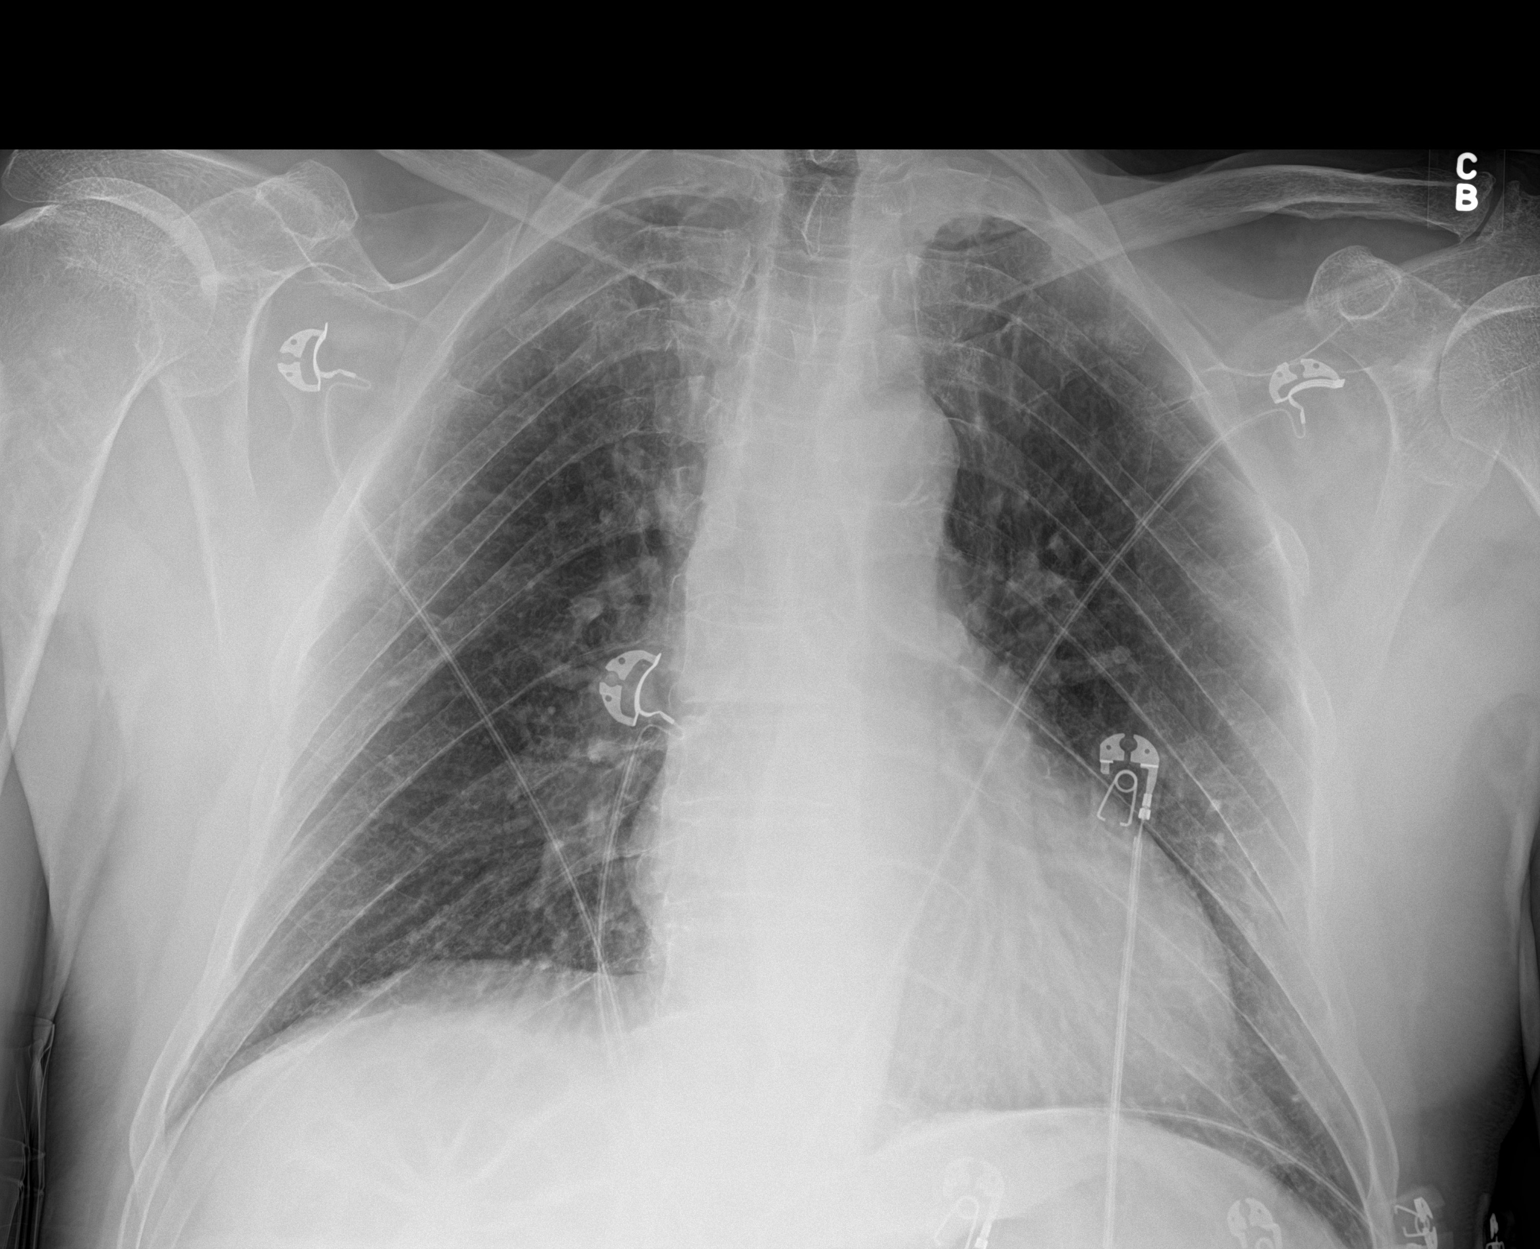

[1 of 1 positions shown; findings below may reference images not displayed]

FINDINGS: The lungs are well-aerated and clear. There is no evidence of focal
opacification, pleural effusion or pneumothorax.

The cardiomediastinal silhouette is within normal limits. No acute
osseous abnormalities are seen.
IMPRESSION: No acute cardiopulmonary process seen.

## 2018-07-18 SURGERY — LEFT HEART CATH AND CORONARY ANGIOGRAPHY
Anesthesia: LOCAL

## 2018-07-18 MED ORDER — FUROSEMIDE 10 MG/ML IJ SOLN
40.0000 mg | Freq: Once | INTRAMUSCULAR | Status: AC
  Administered 2018-07-18: 40 mg via INTRAVENOUS
  Filled 2018-07-18: qty 4

## 2018-07-18 MED ORDER — SODIUM CHLORIDE 0.9% FLUSH: 3.0000 mL | Freq: Once | INTRAVENOUS | Status: AC

## 2018-07-18 MED ORDER — CARVEDILOL 12.5 MG PO TABS
12.5000 mg | ORAL_TABLET | Freq: Two times a day (BID) | ORAL | Status: DC
  Administered 2018-07-18 – 2018-07-19 (×2): 12.5 mg via ORAL
  Filled 2018-07-18 (×2): qty 1

## 2018-07-18 MED ORDER — HEPARIN (PORCINE) IN NACL 1000-0.9 UT/500ML-% IV SOLN
INTRAVENOUS | Status: DC | PRN
  Administered 2018-07-18 (×2): 500 mL

## 2018-07-18 MED ORDER — HEPARIN (PORCINE) 25000 UT/250ML-% IV SOLN
1000.0000 [IU]/h | INTRAVENOUS | Status: DC
  Administered 2018-07-18: 1000 [IU]/h via INTRAVENOUS
  Filled 2018-07-18: qty 250

## 2018-07-18 MED ORDER — TICAGRELOR 90 MG PO TABS
ORAL_TABLET | ORAL | Status: AC
  Filled 2018-07-18: qty 1

## 2018-07-18 MED ORDER — FENTANYL CITRATE (PF) 100 MCG/2ML IJ SOLN
INTRAMUSCULAR | Status: DC | PRN
  Administered 2018-07-18: 25 ug via INTRAVENOUS

## 2018-07-18 MED ORDER — ROSUVASTATIN CALCIUM 20 MG PO TABS
40.0000 mg | ORAL_TABLET | Freq: Every day | ORAL | Status: DC
  Administered 2018-07-18 – 2018-07-20 (×3): 40 mg via ORAL
  Filled 2018-07-18 (×3): qty 2

## 2018-07-18 MED ORDER — MIDAZOLAM HCL 2 MG/2ML IJ SOLN
INTRAMUSCULAR | Status: AC
  Filled 2018-07-18: qty 2

## 2018-07-18 MED ORDER — LIDOCAINE HCL (PF) 1 % IJ SOLN
INTRAMUSCULAR | Status: AC
  Filled 2018-07-18: qty 30

## 2018-07-18 MED ORDER — ASPIRIN EC 81 MG PO TBEC
81.0000 mg | DELAYED_RELEASE_TABLET | Freq: Every day | ORAL | Status: DC
  Administered 2018-07-19 – 2018-07-21 (×3): 81 mg via ORAL
  Filled 2018-07-18 (×3): qty 1

## 2018-07-18 MED ORDER — SODIUM CHLORIDE 0.9% FLUSH: 3.0000 mL | INTRAVENOUS | Status: DC | PRN

## 2018-07-18 MED ORDER — VERAPAMIL HCL 2.5 MG/ML IV SOLN
INTRAVENOUS | Status: DC | PRN
  Administered 2018-07-18: 10 mL via INTRA_ARTERIAL

## 2018-07-18 MED ORDER — LIDOCAINE HCL (PF) 1 % IJ SOLN
INTRAMUSCULAR | Status: DC | PRN
  Administered 2018-07-18: 2 mL

## 2018-07-18 MED ORDER — MIDAZOLAM HCL 2 MG/2ML IJ SOLN
INTRAMUSCULAR | Status: DC | PRN
  Administered 2018-07-18: 1 mg via INTRAVENOUS

## 2018-07-18 MED ORDER — HEPARIN SODIUM (PORCINE) 1000 UNIT/ML IJ SOLN
INTRAMUSCULAR | Status: DC | PRN
  Administered 2018-07-18: 8000 [IU] via INTRAVENOUS

## 2018-07-18 MED ORDER — ACETAMINOPHEN 325 MG PO TABS: 650.0000 mg | ORAL_TABLET | ORAL | Status: DC | PRN

## 2018-07-18 MED ORDER — NITROGLYCERIN IN D5W 200-5 MCG/ML-% IV SOLN
0.0000 ug/min | INTRAVENOUS | Status: DC
  Administered 2018-07-18: 5 ug/min via INTRAVENOUS
  Filled 2018-07-18: qty 250

## 2018-07-18 MED ORDER — HEPARIN (PORCINE) IN NACL 1000-0.9 UT/500ML-% IV SOLN
INTRAVENOUS | Status: AC
  Filled 2018-07-18: qty 1000

## 2018-07-18 MED ORDER — MORPHINE SULFATE (PF) 4 MG/ML IV SOLN
4.0000 mg | Freq: Once | INTRAVENOUS | Status: AC
  Administered 2018-07-18: 4 mg via INTRAVENOUS
  Filled 2018-07-18: qty 1

## 2018-07-18 MED ORDER — TICAGRELOR 90 MG PO TABS
ORAL_TABLET | ORAL | Status: DC | PRN
  Administered 2018-07-18: 180 mg via ORAL

## 2018-07-18 MED ORDER — NITROGLYCERIN 1 MG/10 ML FOR IR/CATH LAB
INTRA_ARTERIAL | Status: AC
  Filled 2018-07-18: qty 10

## 2018-07-18 MED ORDER — FENTANYL CITRATE (PF) 100 MCG/2ML IJ SOLN
INTRAMUSCULAR | Status: AC
  Filled 2018-07-18: qty 2

## 2018-07-18 MED ORDER — HEPARIN SODIUM (PORCINE) 1000 UNIT/ML IJ SOLN
INTRAMUSCULAR | Status: AC
  Filled 2018-07-18: qty 1

## 2018-07-18 MED ORDER — VERAPAMIL HCL 2.5 MG/ML IV SOLN
INTRAVENOUS | Status: AC
  Filled 2018-07-18: qty 2

## 2018-07-18 MED ORDER — SODIUM CHLORIDE 0.9 % IV SOLN: 250.0000 mL | INTRAVENOUS | Status: DC | PRN

## 2018-07-18 MED ORDER — NITROGLYCERIN 1 MG/10 ML FOR IR/CATH LAB
INTRA_ARTERIAL | Status: DC | PRN
  Administered 2018-07-18: 200 ug via INTRACORONARY

## 2018-07-18 MED ORDER — ONDANSETRON HCL 4 MG/2ML IJ SOLN
4.0000 mg | Freq: Four times a day (QID) | INTRAMUSCULAR | Status: DC | PRN
  Administered 2018-07-18: 4 mg via INTRAVENOUS
  Filled 2018-07-18: qty 2

## 2018-07-18 MED ORDER — ENOXAPARIN SODIUM 40 MG/0.4ML ~~LOC~~ SOLN
40.0000 mg | SUBCUTANEOUS | Status: DC
  Administered 2018-07-19 – 2018-07-20 (×2): 40 mg via SUBCUTANEOUS
  Filled 2018-07-18 (×2): qty 0.4

## 2018-07-18 MED ORDER — HEPARIN BOLUS VIA INFUSION
4000.0000 [IU] | Freq: Once | INTRAVENOUS | Status: AC
  Administered 2018-07-18: 4000 [IU] via INTRAVENOUS
  Filled 2018-07-18: qty 4000

## 2018-07-18 MED ORDER — IOHEXOL 350 MG/ML SOLN
INTRAVENOUS | Status: DC | PRN
  Administered 2018-07-18: 105 mL

## 2018-07-18 MED ORDER — NITROGLYCERIN IN D5W 200-5 MCG/ML-% IV SOLN: 0.0000 ug/min | INTRAVENOUS | Status: DC

## 2018-07-18 MED ORDER — TICAGRELOR 90 MG PO TABS
90.0000 mg | ORAL_TABLET | Freq: Two times a day (BID) | ORAL | Status: DC
  Administered 2018-07-18 – 2018-07-21 (×6): 90 mg via ORAL
  Filled 2018-07-18 (×6): qty 1

## 2018-07-18 MED ORDER — SODIUM CHLORIDE 0.9% FLUSH
3.0000 mL | Freq: Two times a day (BID) | INTRAVENOUS | Status: DC
  Administered 2018-07-19: 3 mL via INTRAVENOUS

## 2018-07-18 SURGICAL SUPPLY — 17 items
BALLN EMERGE MR 2.5X12 (BALLOONS) ×2
BALLN ~~LOC~~ EUPHORA RX 3.0X15 (BALLOONS) ×2
BALLOON EMERGE MR 2.5X12 (BALLOONS) IMPLANT
BALLOON ~~LOC~~ EUPHORA RX 3.0X15 (BALLOONS) IMPLANT
CATH INFINITI JR4 5F (CATHETERS) ×1 IMPLANT
CATH LAUNCHER 6FR EBU3.5 (CATHETERS) ×1 IMPLANT
DEVICE RAD COMP TR BAND LRG (VASCULAR PRODUCTS) ×1 IMPLANT
GLIDESHEATH SLEND SS 6F .021 (SHEATH) ×1 IMPLANT
GUIDEWIRE INQWIRE 1.5J.035X260 (WIRE) IMPLANT
INQWIRE 1.5J .035X260CM (WIRE) ×2
KIT ENCORE 26 ADVANTAGE (KITS) ×1 IMPLANT
KIT HEART LEFT (KITS) ×2 IMPLANT
PACK CARDIAC CATHETERIZATION (CUSTOM PROCEDURE TRAY) ×2 IMPLANT
STENT SYNERGY DES 3X24 (Permanent Stent) ×1 IMPLANT
TRANSDUCER W/STOPCOCK (MISCELLANEOUS) ×2 IMPLANT
TUBING CIL FLEX 10 FLL-RA (TUBING) ×2 IMPLANT
WIRE ASAHI PROWATER 180CM (WIRE) ×2 IMPLANT

## 2018-07-18 NOTE — TOC Benefit Eligibility Note (Signed)
Transition of Care [facility]) Benefit Eligibility Note    Patient Details  Name: [name] MRN: [id number] Date of Birth: 04-02-1941   Medication/Dose: BRILINTA   90 MG BID  Covered?: Yes  Tier: 3 Drug  Prescription Coverage [facility]  Spoke with Person/Company/Phone Number:: [name] @   [employer] AT # [phone] OPT- 2  Co-Pay: $ 350.00  Prior Approval: No  Deductible: Unmet       Memory Argue Phone Number: 07/18/2018, 5:01 PM

## 2018-07-18 NOTE — H&P (Addendum)
Cardiology Admission History and Physical:   Patient ID: [name] MRN: [id number]; DOB: August 12, 1940   Admission date: 07/18/2018  Primary Care Provider: Claretta Fraise, MD Primary Cardiologist: Carlyle Dolly, MD  Primary Electrophysiologist:  None   Chief Complaint:  Chest pain  Patient Profile:   [name] is a 78 y.o. male with PMH of CAD s/p multiple stents to RCA, and BMS to OM, HTN, HL who presented to the ED with ongoing chest pain from PCP office.   History of Present Illness:   [name] is a 78 yo male with PMH noted above. Reports MI back in 2004 with several stents placed to the RCA from the ostium to the PDA, and repeat cath in 2007 with occluded LCx OM with BMS placed. Reported to have a GI bleed back in 2004 with DAPT in the setting of NSAID use. He transferred care and was followed by Dr. Verl Blalock, then Dr. Harl Bowie most recently in [place]. Last seen back in 4/19 and reported shortness of breath. Plan for outpatient echo and Myoview. Does not appear myoview was done, but echo with normal EF.   Saturday he was working on a garden and moving around [employer]. Worked all morning. That afternoon he developed left sided chest pain and took 2 SL nitro with resolution of pain. Symptoms were intermittent all weekend but responded to nitro. This morning developed chest pain with left shoulder pain. Drove to his PCP and transported to [facility] via EMS. EKG at the office without ischemia. Given 324 ASA en route.   In the ED his EKG showed < 49mm ST depression in inferoseptal leads. Troponin 0.26. Cardiology called to evaluate. At the time of assessment rating pain 6/10. Being started on IV nitro, and heparin gtt ordered. While assessing patient pain worsened. Repeated EKG with worsening ST depression in septal leads.    Past Medical History:  Diagnosis Date   Bradycardia    CAD in native artery    History of GI bleed    History of mitral valve prolapse    History of  peptic ulcer disease    Hyperlipidemia, mixed    Hypertension    MI (myocardial infarction) ([place]) 2000, 2004    Past Surgical History:  Procedure Laterality Date   CORONARY STENT PLACEMENT  2004, 2007   5 in 2004, 1 in 2007     Medications Prior to Admission: Prior to Admission medications   Medication Sig Start Date End Date Taking? Authorizing Provider  nitroGLYCERIN (NITROSTAT) 0.4 MG SL tablet Place 1 tablet (0.4 mg total) under the tongue every 5 (five) minutes as needed for chest pain. 02/09/17  Yes Claretta Fraise, MD  atorvastatin (LIPITOR) 80 MG tablet Take 1 tablet (80 mg total) by mouth daily. Patient not taking: Reported on 07/18/2018 08/11/17   Claretta Fraise, MD  metoprolol succinate (TOPROL-XL) 50 MG 24 hr tablet Take 1 tablet (50 mg total) by mouth daily. Patient not taking: Reported on 07/18/2018 08/11/17   Claretta Fraise, MD  traZODone (DESYREL) 100 MG tablet Take 1 tablet (100 mg total) by mouth at bedtime. 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 all   Food insecurity:    Worry: Never true    Inability: Never true   Transportation needs:    Medical: No    Non-medical: No  Tobacco Use   Smoking status: Former Smoker    Last attempt to quit: 05/09/1976    Years since quitting: 42.2   Smokeless tobacco: Never Used  Substance and Sexual Activity   Alcohol use: No   Drug use: No   Sexual activity: Not Currently  Lifestyle   Physical activity:    Days per week: 0 days    Minutes per session: 0 min   Stress: Only a little    Relationships   Social connections:    Talks on phone: More than three times a week    Gets together: More than three times a week    Attends religious service: More than 4 times per year    Active member of club or organization: Not on file    Attends meetings of clubs or organizations: More than 4 times per year    Relationship status: Married   Intimate partner violence:    Fear of current or ex partner: Not on file    Emotionally abused: Not on file    Physically abused: Not on file    Forced sexual activity: Not on file  Other Topics Concern   Not on file  Social History Narrative   Married   Disabled   Exercises regularly    Family History:   The patient's family history includes Cancer in his daughter; Coronary artery disease in an other family member; Heart attack in his father and mother.    ROS:  Please see the history of present illness.  All other ROS reviewed and negative.     Physical Exam/Data:   Vitals:   07/18/18 1330 07/18/18 1340 07/18/18 1345 07/18/18 1404  BP: (!) 143/93 (!) 117/49 (!) 134/112   Pulse:  (!) 57 62   Resp:  17 (!) 21   Temp:      TempSrc:      SpO2:  98% 98% 99%  Weight:      Height:       No intake or output data in the 24 hours ending 07/18/18 1410 Last 3 Weights 07/18/2018 05/11/2018 02/12/2018  Weight (lbs) 185 lb 207 lb 205 lb  Weight (kg) 83.915 kg 93.895 kg 92.987 kg     Body mass index is 23.75 kg/m.  General:  Well nourished, well developed. HEENT: normal Lymph: no adenopathy Neck: no JVD Endocrine:  No thryomegaly Vascular: No carotid bruits; FA pulses 2+ bilaterally without bruits  Cardiac:  normal S1, S2; RRR; no murmur  Lungs:  clear to auscultation bilaterally, no wheezing, rhonchi or rales  Abd: soft, nontender, no hepatomegaly  Ext: no edema Musculoskeletal:  No deformities, BUE and BLE strength normal and equal Skin: warm and dry  Neuro:  CNs 2-12 intact, no focal abnormalities noted Psych:  Normal  affect    EKG:  The ECG that was done 07/18/2018 was personally reviewed and demonstrates SR with evolving ST changes  Relevant CV Studies:  TTE: 08/15/17  Study Conclusions  - Left ventricle: The cavity size was normal. Wall thickness was   increased in a pattern of mild LVH. Systolic function was normal.   The estimated ejection fraction was  in the range of 60% to 65%.   Wall motion was normal; there were no regional wall motion   abnormalities. Doppler parameters are consistent with abnormal   left ventricular relaxation (grade 1 diastolic dysfunction). - Aortic valve: Trileaflet; mildly thickened leaflets. - Mitral valve: There was mild regurgitation. - Right atrium: Central venous pressure (est): 3 mm Hg. - Atrial septum: No defect or patent foramen ovale was identified. - Tricuspid valve: There was mild regurgitation. - Pulmonary arteries: PA peak pressure: 21 mm Hg (S). - Pericardium, extracardiac: There was no pericardial effusion.  Laboratory Data:  Chemistry Recent Labs  Lab 07/18/18 1233  NA 139  K 4.2  CL 106  CO2 26  GLUCOSE 117*  BUN 18  CREATININE 1.36*  CALCIUM 9.3  GFRNONAA 50*  GFRAA 58*  ANIONGAP 7    No results for input(s): PROT, ALBUMIN, AST, ALT, ALKPHOS, BILITOT in the last 168 hours. Hematology Recent Labs  Lab 07/18/18 1233  WBC 7.0  RBC 4.88  HGB 14.3  HCT 44.5  MCV 91.2  MCH 29.3  MCHC 32.1  RDW 13.3  PLT 243   Cardiac EnzymesNo results for input(s): TROPONINI in the last 168 hours.  Recent Labs  Lab 07/18/18 1236  TROPIPOC 0.26*    BNPNo results for input(s): BNP, PROBNP in the last 168 hours.  DDimer No results for input(s): DDIMER in the last 168 hours.  Radiology/Studies:  Dg Chest Portable 1 View  Result Date: 07/18/2018 CLINICAL DATA:  Acute onset of generalized chest pain, radiating to the left arm. EXAM: PORTABLE CHEST 1 VIEW COMPARISON:  Chest radiograph performed 08/11/2017 FINDINGS: The lungs are well-aerated  and clear. There is no evidence of focal opacification, pleural effusion or pneumothorax. The cardiomediastinal silhouette is within normal limits. No acute osseous abnormalities are seen. IMPRESSION: No acute cardiopulmonary process seen. Electronically Signed   By: Garald Balding M.D.   On: 07/18/2018 13:51    Assessment and Plan:   JERAL ZICK is a 78 y.o. male with PMH of CAD s/p multiple stents to RCA, and BMS to OM, HTN, HL who presented to the ED with ongoing chest pain from PCP office.   1. Unstable Angina with evolving EKG changes: Worsening pain while in the ED. Initial troponin 0.26, EKG with worsening ST depression in inferoseptal leads. Placed on IV nitro and heparin. Brought to the cath lab for urgent cardiac cath.  -- further recommendations post cath  2. CAD s/p multiple RCA stents/BMS to Lcx/OM: reports he has not been taking his cardiac meds for about a year. Last cath in 2007 with patent RCA stents, and BMS placed at that time.   3. HTN: stable in the ED, on nitro drip  4. HL: LDL 207 back in October, suppose to be statin though unclear if taking.  Severity of Illness: The appropriate patient status for this patient is INPATIENT. Inpatient status is judged to be reasonable and necessary in order to provide the required intensity of service to ensure the patient's safety. The patient's presenting symptoms, physical exam findings, and initial radiographic and laboratory data in the context of their chronic comorbidities is felt to place them at high risk for further clinical deterioration. Furthermore, it is not anticipated that the patient will be medically stable for discharge from the hospital within 2 midnights of admission. The following factors support the patient status of inpatient.   " The patient's presenting symptoms include chest pain, shortness of breath. " The worrisome physical  exam findings include normal exam. " The initial radiographic and laboratory data are  worrisome because of EKG with evolving ST changes. " The chronic co-morbidities include HTN, HL, CAD.   * I certify that at the point of admission it is my clinical judgment that the patient will require inpatient hospital care spanning beyond 2 midnights from the point of admission due to high intensity of service, high risk for further deterioration and high frequency of surveillance required.*    For questions or updates, please contact [place] Please consult [email or web address] for contact info under    Signed, Reino Bellis, NP  07/18/2018 2:10 PM    Agree with note by Reino Bellis NP-C  [name] is a 78 year old Caucasian male patient of Dr. Roderic Palau branches with prior history of circumflex and RCA intervention in [place].  His other problems include history of hypertension, hyperlipidemia and ongoing tobacco abuse.  He has not taken his cardiac medications for over a year.  He has had new onset angina which is progressive and unstable.  His EKG shows subendocardial ischemia with ST segment depression.  His exam otherwise is benign.  He will need urgent cardiac catheterization.  Lorretta Harp, M.D., [place], [facility], [place] [address] [place], [place]  [address]  [phone] 07/18/2018 3:15 PM

## 2018-07-18 NOTE — ED Notes (Signed)
Critical Lab results was given to Dr. Stark Jock.

## 2018-07-18 NOTE — ED Triage Notes (Signed)
Pt with history 2 MI and 6 stents placed. Pt having 7/10 chest pain since Saturday.  Nitro not effective. Nitro expired. Pain 7/10 today and radiating to left arm at pcp. Received 1 nitro en route and 324aspirin. Not effective.

## 2018-07-18 NOTE — ED Provider Notes (Signed)
[facility] EMERGENCY DEPARTMENT Provider Note   CSN: [id number] Arrival date & time: 07/18/18  1222    History   Chief Complaint Chief Complaint  Patient presents with  . Chest Pain    HPI [name] is a 78 y.o. male.     Patient is a 78 year old male with history of coronary artery disease with stents in 2002 and 2004.  He presents today with complaints of chest discomfort.  This began on Saturday after performing gardening work.  He was moving soil and garden pavers prior to the onset of the discomfort.  He has been taking nitroglycerin intermittently since that time which does seem to help, but for a short period of time.  He describes a tightness to the left side of his chest radiating down his left arm.  He feels short of breath along with it.  The history is provided by the patient.  Chest Pain  Pain location:  Substernal area and L chest Pain quality: pressure   Pain radiates to:  L arm Pain severity:  Moderate Onset quality:  Sudden Duration:  4 days Timing:  Intermittent Progression:  Worsening Chronicity:  New Relieved by:  Nitroglycerin Worsened by:  Nothing   Past Medical History:  Diagnosis Date  . Bradycardia   . CAD in native artery   . History of GI bleed   . History of mitral valve prolapse   . History of peptic ulcer disease   . Hyperlipidemia, mixed   . Hypertension   . MI (myocardial infarction) ([place]) 2000, 2004    Patient Active Problem List   Diagnosis Date Noted  . Mild cognitive impairment 10/04/2017  . ASCVD (arteriosclerotic cardiovascular disease) 05/22/2014  . Hyperlipidemia with target LDL less than 70 05/22/2014  . Essential hypertension, benign 01/03/2009  . PUD, HX OF 01/03/2009    Past Surgical History:  Procedure Laterality Date  . CORONARY STENT PLACEMENT  2004, 2007   5 in 2004, 1 in 2007        Home Medications    Prior to Admission medications   Medication Sig Start Date End Date Taking?  Authorizing Provider  nitroGLYCERIN (NITROSTAT) 0.4 MG SL tablet Place 1 tablet (0.4 mg total) under the tongue every 5 (five) minutes as needed for chest pain. 02/09/17  Yes Claretta Fraise, MD  atorvastatin (LIPITOR) 80 MG tablet Take 1 tablet (80 mg total) by mouth daily. Patient not taking: Reported on 07/18/2018 08/11/17   Claretta Fraise, MD  metoprolol succinate (TOPROL-XL) 50 MG 24 hr tablet Take 1 tablet (50 mg total) by mouth daily. Patient not taking: Reported on 07/18/2018 08/11/17   Claretta Fraise, MD  traZODone (DESYREL) 100 MG tablet Take 1 tablet (100 mg total) by mouth at bedtime. Patient not taking: Reported on 07/18/2018 02/12/18   Claretta Fraise, MD    Family History Family History  Problem Relation Age of Onset  . Heart attack Mother   . Heart attack Father   . Coronary artery disease Other   . Cancer Daughter     Social History Social History   Tobacco Use  . Smoking status: Former Smoker    Last attempt to quit: 05/09/1976    Years since quitting: 42.2  . Smokeless tobacco: Never Used  Substance Use Topics  . Alcohol use: No  . Drug use: No     Allergies   Ibuprofen   Review of Systems Review of Systems  Cardiovascular: Positive for chest pain.  All  other systems reviewed and are negative.    Physical Exam Updated Vital Signs Temp 97.8 F (36.6 C) (Oral)   Ht 6\' 2"  (1.88 m)   Wt 83.9 kg   BMI 23.75 kg/m   Physical Exam Vitals signs and nursing note reviewed.  Constitutional:      General: He is not in acute distress.    Appearance: He is well-developed. He is not diaphoretic.  HENT:     Head: Normocephalic and atraumatic.  Neck:     Musculoskeletal: Normal range of motion and neck supple.  Cardiovascular:     Rate and Rhythm: Normal rate and regular rhythm.     Heart sounds: No murmur. No friction rub.  Pulmonary:     Effort: Pulmonary effort is normal. No respiratory distress.     Breath sounds: Normal breath sounds. No wheezing or rales.   Abdominal:     General: Bowel sounds are normal. There is no distension.     Palpations: Abdomen is soft.     Tenderness: There is no abdominal tenderness.  Musculoskeletal: Normal range of motion.     Right lower leg: He exhibits no tenderness. No edema.     Left lower leg: He exhibits no tenderness. No edema.  Skin:    General: Skin is warm and dry.  Neurological:     Mental Status: He is alert and oriented to person, place, and time.     Coordination: Coordination normal.      ED Treatments / Results  Labs (all labs ordered are listed, but only abnormal results are displayed) Labs Reviewed  BASIC METABOLIC PANEL  CBC  I-STAT TROPONIN, ED    EKG EKG Interpretation  Date/Time:  Wednesday July 18 2018 12:24:05 EDT Ventricular Rate:  61 PR Interval:    QRS Duration: 102 QT Interval:  431 QTC Calculation: 435 R Axis:   -14 Text Interpretation:  Sinus rhythm Nonspecific repol abnormality, diffuse leads Confirmed by Veryl Speak [phone]) on 07/18/2018 12:35:27 PM   Radiology No results found.  Procedures Procedures (including critical care time)  Medications Ordered in ED Medications  sodium chloride flush (NS) 0.9 % injection 3 mL (has no administration in time range)  morphine 4 MG/ML injection 4 mg (has no administration in time range)     Initial Impression / Assessment and Plan / ED Course  I have reviewed the triage vital signs and the nursing notes.  Pertinent labs & imaging results that were available during my care of the patient were reviewed by me and considered in my medical decision making (see chart for details).  Patient is a 78 year old male with history of coronary artery disease with stenting presenting with complaints of chest discomfort intermittently for the past 5 days.  He was seen at his doctor's office, then referred here.  His initial EKG shows ST depressions in the anterior and inferior leads along with an initial troponin of 0.26.   The patient's symptoms are very concerning for cardiac disease.  These findings were discussed with the cardiologist who has evaluated the patient.  He will likely undergo cardiac catheterization.  Patient still has ongoing discomfort after receiving morphine.  He was started on a nitroglycerin drip along with a heparin drip.  He took 2 baby aspirin at home and received 2 baby aspirin by EMS.  CRITICAL CARE Performed by: Veryl Speak Total critical care time: 45 minutes Critical care time was exclusive of separately billable procedures and treating other patients. Critical care was necessary  to treat or prevent imminent or life-threatening deterioration. Critical care was time spent personally by me on the following activities: development of treatment plan with patient and/or surrogate as well as nursing, discussions with consultants, evaluation of patient's response to treatment, examination of patient, obtaining history from patient or surrogate, ordering and performing treatments and interventions, ordering and review of laboratory studies, ordering and review of radiographic studies, pulse oximetry and re-evaluation of patient's condition.   Final Clinical Impressions(s) / ED Diagnoses   Final diagnoses:  None    ED Discharge Orders    None       Veryl Speak, MD 07/18/18 1418

## 2018-07-18 NOTE — Plan of Care (Signed)
  Problem: Education: Goal: Understanding of medication regimen will improve Outcome: Progressing   Problem: Activity: Goal: Ability to tolerate increased activity will improve Outcome: Progressing   Problem: Activity: Goal: Ability to return to baseline activity level will improve Outcome: Progressing   Problem: Cardiovascular: Goal: Ability to achieve and maintain adequate cardiovascular perfusion will improve Outcome: Progressing Goal: Vascular access site(s) Level 0-1 will be maintained Outcome: Progressing

## 2018-07-18 NOTE — Progress Notes (Signed)
[facility] for heparin Indication: chest pain/ACS  Heparin Dosing Weight: 83.9 kg  Labs: Recent Labs    07/18/18 1233  HGB 14.3  HCT 44.5  PLT 243    Assessment: 89 yom presenting with CP, elevated troponin. Pharmacy consulted to dose heparin for ACS. Not on anticoagulation PTA. Noted hx GIB but no active bleed issues documented currently. CBC wnl.  Goal of Therapy:  Heparin level 0.3-0.7 units/ml Monitor platelets by anticoagulation protocol: Yes   Plan:  Heparin 4000 unit bolus Start heparin at 1000 units/h 8h heparin level Daily heparin level/CBC Monitor s/sx bleeding F/u Cardiology plans  Elicia Lamp, PharmD, BCPS Clinical Pharmacist 07/18/2018 1:28 PM

## 2018-07-18 NOTE — Progress Notes (Signed)
BP 130/77   Pulse (!) 58   Temp (!) 96.8 F (36 C) (Oral)   Resp (!) 24   SpO2 98%    Subjective:    Patient ID: [name], male    DOB: 06/24/1940, 78 y.o.   MRN: [id number]  HPI: [name] is a 78 y.o. male presenting on 07/18/2018 for Chest Pain (Started taking nitro at 2:30 this morning. Has had 4 with the last around 10:00 am this morning. Has taken two ASA 81mg  as well. )  Since Saturday he has had on and off chest pain. No relief with nitroglycerin.  Since 2 AM he has had pain today.  He took nitroglycerin at that time did not give him relief.  Since about 10 AM he is taken for nitroglycerin without relief.  Therefore he decided to come back to our hospitalist.  He does have a past history of severe many years ago.  Has not been on any medication.  Chest pain and to left arm  EKG changes and bradycardia  When he came into the office he was particularly weak and had to be put in a wheelchair.  And his color was quite ashen.  After going on 2 L of oxygen he did have some pinking of his skin.   Past Medical History:  Diagnosis Date  . Bradycardia   . CAD in native artery   . History of GI bleed   . History of mitral valve prolapse   . History of peptic ulcer disease   . Hyperlipidemia, mixed   . Hypertension   . MI (myocardial infarction) ([place]) 2000, 2004   Relevant past medical, surgical, family and social history reviewed and updated as indicated. Interim medical history since our last visit reviewed. Allergies and medications reviewed and updated. DATA REVIEWED: CHART IN EPIC  Family History reviewed for pertinent findings.  Review of Systems  Constitutional: Positive for fatigue. Negative for appetite change.  HENT: Negative.   Eyes: Negative.  Negative for pain and visual disturbance.  Respiratory: Negative.  Negative for cough, chest tightness, shortness of breath and wheezing.   Cardiovascular: Positive for chest pain. Negative for palpitations and leg  swelling.  Gastrointestinal: Negative.  Negative for abdominal pain, diarrhea, nausea and vomiting.  Endocrine: Negative.   Genitourinary: Negative.   Musculoskeletal: Negative.   Skin: Negative.  Negative for color change and rash.  Neurological: Negative.  Negative for weakness, numbness and headaches.  Psychiatric/Behavioral: Negative.     Allergies as of 07/18/2018      Reactions   Ibuprofen       Medication List       Accurate as of July 18, 2018 11:31 AM. Always use your most recent med list.        aspirin 81 MG tablet Take 81 mg by mouth daily.   atorvastatin 80 MG tablet Commonly known as:  LIPITOR Take 1 tablet (80 mg total) by mouth daily.   metoprolol succinate 50 MG 24 hr tablet Commonly known as:  TOPROL-XL Take 1 tablet (50 mg total) by mouth daily.   nitroGLYCERIN 0.4 MG SL tablet Commonly known as:  NITROSTAT Place 1 tablet (0.4 mg total) under the tongue every 5 (five) minutes as needed for chest pain.   traZODone 100 MG tablet Commonly known as:  DESYREL Take 1 tablet (100 mg total) by mouth at bedtime.          Objective:    BP 130/77  Pulse (!) 58   Temp (!) 96.8 F (36 C) (Oral)   Resp (!) 24   SpO2 98%   Allergies  Allergen Reactions  . Ibuprofen     Wt Readings from Last 3 Encounters:  05/11/18 207 lb (93.9 kg)  02/12/18 205 lb (93 kg)  10/04/17 203 lb (92.1 kg)    Physical Exam Vitals signs and nursing note reviewed.  Constitutional:      General: He is in acute distress.     Appearance: He is well-developed.  HENT:     Head: Normocephalic and atraumatic.  Eyes:     Conjunctiva/sclera: Conjunctivae normal.     Pupils: Pupils are equal, round, and reactive to light.  Cardiovascular:     Rate and Rhythm: Regular rhythm. Bradycardia present.     Heart sounds: Normal heart sounds.  Pulmonary:     Effort: Pulmonary effort is normal. No respiratory distress.     Breath sounds: Normal breath sounds.  Chest:     Chest  wall: No deformity.  Skin:    General: Skin is warm and dry.  Psychiatric:        Behavior: Behavior normal.         Assessment & Plan:   1. Unstable angina pectoris ([place]) EMS transport to [facility]  2. Bradycardia by electrocardiogram See above   Continue all other maintenance medications as listed above.  Follow up plan: No follow-ups on file.  Educational handout given for [place] PA-C [facility] [address]  [place], [place] [address] [phone]   07/18/2018, 11:31 AM

## 2018-07-18 NOTE — Progress Notes (Signed)
  Echocardiogram 2D Echocardiogram has been performed.  [name] 07/18/2018, 4:56 PM

## 2018-07-19 ENCOUNTER — Inpatient Hospital Stay (HOSPITAL_COMMUNITY): Payer: Medicare HMO

## 2018-07-19 ENCOUNTER — Encounter (HOSPITAL_COMMUNITY): Payer: Self-pay | Admitting: Cardiology

## 2018-07-19 DIAGNOSIS — I5041 Acute combined systolic (congestive) and diastolic (congestive) heart failure: Secondary | ICD-10-CM

## 2018-07-19 DIAGNOSIS — N183 Chronic kidney disease, stage 3 unspecified: Secondary | ICD-10-CM | POA: Diagnosis present

## 2018-07-19 DIAGNOSIS — N179 Acute kidney failure, unspecified: Secondary | ICD-10-CM | POA: Diagnosis present

## 2018-07-19 HISTORY — DX: Acute combined systolic (congestive) and diastolic (congestive) heart failure: I50.41

## 2018-07-19 LAB — CBC
HCT: 45.8 % (ref 39.0–52.0)
Hemoglobin: 15.5 g/dL (ref 13.0–17.0)
MCH: 30.2 pg (ref 26.0–34.0)
MCHC: 33.8 g/dL (ref 30.0–36.0)
MCV: 89.3 fL (ref 80.0–100.0)
Platelets: 269 10*3/uL (ref 150–400)
RBC: 5.13 MIL/uL (ref 4.22–5.81)
RDW: 13.3 % (ref 11.5–15.5)
WBC: 10.8 10*3/uL — ABNORMAL HIGH (ref 4.0–10.5)
nRBC: 0 % (ref 0.0–0.2)

## 2018-07-19 LAB — GLUCOSE, CAPILLARY: Glucose-Capillary: 136 mg/dL — ABNORMAL HIGH (ref 70–99)

## 2018-07-19 LAB — BASIC METABOLIC PANEL
Anion gap: 12 (ref 5–15)
BUN: 18 mg/dL (ref 8–23)
CHLORIDE: 101 mmol/L (ref 98–111)
CO2: 26 mmol/L (ref 22–32)
Calcium: 9.2 mg/dL (ref 8.9–10.3)
Creatinine, Ser: 1.47 mg/dL — ABNORMAL HIGH (ref 0.61–1.24)
GFR calc Af Amer: 53 mL/min — ABNORMAL LOW (ref >60)
GFR calc non Af Amer: 46 mL/min — ABNORMAL LOW (ref >60)
Glucose, Bld: 112 mg/dL — ABNORMAL HIGH (ref 70–99)
Potassium: 3.7 mmol/L (ref 3.5–5.1)
Sodium: 139 mmol/L (ref 135–145)

## 2018-07-19 LAB — TROPONIN I: Troponin I: >65 ng/mL (ref <0.03)

## 2018-07-19 MED ORDER — SODIUM CHLORIDE 0.9 % WEIGHT BASED INFUSION
1.0000 mL/kg/h | INTRAVENOUS | Status: DC
  Administered 2018-07-20: 1 mL/kg/h via INTRAVENOUS

## 2018-07-19 MED ORDER — ISOSORBIDE MONONITRATE ER 30 MG PO TB24
15.0000 mg | ORAL_TABLET | Freq: Every day | ORAL | Status: DC
  Administered 2018-07-19: 15 mg via ORAL
  Filled 2018-07-19: qty 1

## 2018-07-19 MED ORDER — SODIUM CHLORIDE 0.9 % IV SOLN: 250.0000 mL | INTRAVENOUS | Status: DC | PRN

## 2018-07-19 MED ORDER — SODIUM CHLORIDE 0.9% FLUSH
3.0000 mL | Freq: Two times a day (BID) | INTRAVENOUS | Status: DC
  Administered 2018-07-19 (×2): 3 mL via INTRAVENOUS

## 2018-07-19 MED ORDER — NITROGLYCERIN IN D5W 200-5 MCG/ML-% IV SOLN
0.0000 ug/min | INTRAVENOUS | Status: DC
  Administered 2018-07-19: 3 ug/min via INTRAVENOUS

## 2018-07-19 MED ORDER — SODIUM CHLORIDE 0.9% FLUSH: 3.0000 mL | INTRAVENOUS | Status: DC | PRN

## 2018-07-19 MED ORDER — SODIUM CHLORIDE 0.9 % WEIGHT BASED INFUSION
3.0000 mL/kg/h | INTRAVENOUS | Status: DC
  Administered 2018-07-20: 3 mL/kg/h via INTRAVENOUS

## 2018-07-19 NOTE — Progress Notes (Signed)
Progress Note  Patient Name: [name] Date of Encounter: 07/19/2018  Primary Cardiologist: Carlyle Dolly, MD   Subjective   Did not sleep well. Notes some SOB. Chest pain resolved.   Inpatient Medications    Scheduled Meds: . aspirin EC  81 mg Oral Daily  . carvedilol  12.5 mg Oral BID WC  . enoxaparin (LOVENOX) injection  40 mg Subcutaneous Q24H  . rosuvastatin  40 mg Oral q1800  . sodium chloride flush  3 mL Intravenous Q12H  . ticagrelor  90 mg Oral BID   Continuous Infusions: . sodium chloride    . nitroGLYCERIN 10 mcg/min (07/18/18 1558)   PRN Meds: sodium chloride, acetaminophen, ondansetron (ZOFRAN) IV, sodium chloride flush   Vital Signs    Vitals:   07/19/18 0530 07/19/18 0600 07/19/18 0630 07/19/18 0700  BP: 119/84 103/81 119/84 113/79  Pulse: 71 65 67 73  Resp: 18 19 16 17   Temp:      TempSrc:      SpO2: 93% 97% 94% 92%  Weight:      Height:        Intake/Output Summary (Last 24 hours) at 07/19/2018 0745 Last data filed at 07/19/2018 0600 Gross per 24 hour  Intake 519.21 ml  Output 1900 ml  Net -1380.79 ml   Last 3 Weights 07/18/2018 05/11/2018 02/12/2018  Weight (lbs) 185 lb 207 lb 205 lb  Weight (kg) 83.915 kg 93.895 kg 92.987 kg      Telemetry    NSR with frequent PVC and multiple runs of NSVT 3-10 beats - Personally Reviewed  ECG    NSR, T wave inversion laterally.  - Personally Reviewed  Physical Exam   GEN: No acute distress.   Neck: No JVD Cardiac: RRR, no murmurs, rubs, or gallops.  Respiratory: Clear to auscultation bilaterally. GI: Soft, nontender, non-distended  MS: No edema; No deformity. Neuro:  Nonfocal  Psych: Normal affect   Labs    Chemistry Recent Labs  Lab 07/18/18 1233 07/18/18 2148 07/19/18 0244  NA 139 139 139  K 4.2 3.9 3.7  CL 106 105 101  CO2 26 20* 26  GLUCOSE 117* 130* 112*  BUN 18 17 18   CREATININE 1.36* 1.41* 1.47*  CALCIUM 9.3 9.4 9.2  GFRNONAA 50* 48* 46*  GFRAA 58* 56* 53*   ANIONGAP 7 14 12      Hematology Recent Labs  Lab 07/18/18 1233 07/19/18 0244  WBC 7.0 10.8*  RBC 4.88 5.13  HGB 14.3 15.5  HCT 44.5 45.8  MCV 91.2 89.3  MCH 29.3 30.2  MCHC 32.1 33.8  RDW 13.3 13.3  PLT 243 269    Cardiac Enzymes Recent Labs  Lab 07/18/18 1600 07/18/18 2041 07/19/18 0244  TROPONINI 36.70* >65.00* >65.00*    Recent Labs  Lab 07/18/18 1236  TROPIPOC 0.26*     BNPNo results for input(s): BNP, PROBNP in the last 168 hours.   DDimer No results for input(s): DDIMER in the last 168 hours.   Radiology    Dg Chest Portable 1 View  Result Date: 07/18/2018 CLINICAL DATA:  Acute onset of generalized chest pain, radiating to the left arm. EXAM: PORTABLE CHEST 1 VIEW COMPARISON:  Chest radiograph performed 08/11/2017 FINDINGS: The lungs are well-aerated and clear. There is no evidence of focal opacification, pleural effusion or pneumothorax. The cardiomediastinal silhouette is within normal limits. No acute osseous abnormalities are seen. IMPRESSION: No acute cardiopulmonary process seen. Electronically Signed   By: Francoise Schaumann.D.  On: 07/18/2018 13:51    Cardiac Studies   Procedures   Coronary/Graft Acute MI Revascularization  CORONARY STENT INTERVENTION  LEFT HEART CATH AND CORONARY ANGIOGRAPHY  Conclusion     Previously placed Mid RCA to Dist RCA stent (unknown type) is widely patent.  Ost RCA to Mid RCA lesion is 30% stenosed.  Mid RCA lesion is 95% stenosed.  Ost 1st Mrg to 1st Mrg lesion is 100% stenosed.  Ost LAD to Prox LAD lesion is 60% stenosed.  Prox LAD lesion is 25% stenosed.  Ost 1st Diag to 1st Diag lesion is 95% stenosed.  Mid LAD to Dist LAD lesion is 99% stenosed.  Post intervention, there is a 0% residual stenosis.  A drug-eluting stent was successfully placed using a STENT SYNERGY DES [id number].  LV end diastolic pressure is moderately elevated.   1. Severe 3 vessel obstructive CAD   - Severe aneurysmal disease in  the proximal LAD. Diffuse 99% stenosis in the mid to distal LAD. 95% bifurcating first diagonal.    - 100% occlusion of the large first OM at site of prior stent. This is the culprit lesion   - 95% mid RCA in stent 2. Moderately elevated LVEDP 3. Successful PCI of the OM 1 with DES x1  Discussed with Dr. Gwenlyn Found. There are no options for treating the LAD. It is diffusely diseased and there is no target for CABG. There are some right to left collaterals to the distal LAD. We proceeded with emergent PCI of the OM1. Recommend DAPT indefinitely given multiple layers of stent. Plan to bring patient back in 2 days for PCI of the RCA. Will treat the LAD medically. Check LV function by Echo. Maximize medical therapy. Stress importance of medication compliance.    Echo: IMPRESSIONS    1. The left ventricle has low normal systolic function, with an ejection fraction of 50-55%. The cavity size was normal. Left ventricular diastolic Doppler parameters are indeterminate.  2. Mild hypokinesis of the left ventricular, mid-apical inferoseptal wall, inferior segment and inferolateral wall.  3. The right ventricle has normal systolic function. The cavity was normal. There is no increase in right ventricular wall thickness.  4. The aortic valve is tricuspid Mild thickening of the aortic valve Mild calcification of the aortic valve. Aortic valve regurgitation was not assessed by color flow Doppler.   Patient Profile     78 y.o. male with PMH of CAD s/p multiple stents to RCA, and BMS to OM, HTN, HL, and noncompliance who presented to the ED with ongoing chest pain from PCP office. Ecg c/w acute posterior STEMI  Assessment & Plan    1. Acute posterior STEMI secondary to occlusion of OM1. Previously stented with BMS. Now s/p emergent stenting with DES. Troponin > 65. EF Mildly reduced by Echo. Patient has severe 3 vessel disease. LAD is very aneurysmal proximally and severely and diffusely diseased in the mid to  distal vessel. Not amenable to PCI. Poor target for bypass. Also severe in stent stenosis in the mid RCA. Plan PCI of the RCA this admission. Likely tomorrow if renal function is stable. Would recommend life long DAPT. Continue beta blocker, nitrates, statin.  2. Hypercholesterolemia. LDL > 200. Now on high dose Crestor. If this does not yield good control consider PCSK 9 inhibitor.  3. HTN controlled.  4. Acute combined systolic/diastolic CHF. EF 50%. EDP 29 mm at cath. Excellent response to IV lasix x 1. Will hold further diuresis due to elevated creatinine. On  Coreg.  5. NSVT. Post reperfusion for STEMI. Asymptomatic. Continue beta blocker and monitor.  6. Acute on CKD stage 3. Creatinine increased from 1.36 to 1.47 post cath and with diuresis. Will monitor.   For questions or updates, please contact [facility] Please consult [email or web address] for contact info under        Signed, Doak Mah [place], MD  07/19/2018, 7:45 AM

## 2018-07-19 NOTE — Progress Notes (Signed)
0915 Complaining of chest pain and difficulty breathing. O2 was applied and Nitro gtt was restated. EKG was preformed. Cardiology at bedside. Nitro at 52mcg and patient states he's feeling better.

## 2018-07-19 NOTE — Progress Notes (Signed)
Called to see patient for intermittent breathlessness. Had chest pain earlier relieved with IV Ntg. Oxygen sats are good. He has no JVD. Lungs are clear. No murmur or rub on exam. Echo reviewed and there is only mild MR. Lateral wall HK. LV function mildly reduced.  Will check CXR. BP running low on IV Ntg so will hold Coreg for now. Continue to monitor. Some of breathlessness may be ischemic in origin. Still anticipate PCI of RCA in am.  Samin Milke [place] MD, [facility]  1:33 PM

## 2018-07-20 ENCOUNTER — Encounter (HOSPITAL_COMMUNITY): Payer: Self-pay | Admitting: Cardiology

## 2018-07-20 ENCOUNTER — Encounter (HOSPITAL_COMMUNITY)
Admission: EM | Disposition: A | Discharge status: Home / Self Care | Payer: Self-pay | Source: Home / Self Care | Attending: Cardiovascular Disease

## 2018-07-20 DIAGNOSIS — Z029 Encounter for administrative examinations, unspecified: Secondary | ICD-10-CM

## 2018-07-20 DIAGNOSIS — I5041 Acute combined systolic (congestive) and diastolic (congestive) heart failure: Secondary | ICD-10-CM

## 2018-07-20 DIAGNOSIS — N183 Chronic kidney disease, stage 3 (moderate): Secondary | ICD-10-CM

## 2018-07-20 DIAGNOSIS — N170 Acute kidney failure with tubular necrosis: Secondary | ICD-10-CM

## 2018-07-20 HISTORY — PX: CORONARY STENT INTERVENTION: CATH118234

## 2018-07-20 LAB — BASIC METABOLIC PANEL
Anion gap: 6 (ref 5–15)
BUN: 27 mg/dL — AB (ref 8–23)
CO2: 25 mmol/L (ref 22–32)
Calcium: 8.5 mg/dL — ABNORMAL LOW (ref 8.9–10.3)
Chloride: 105 mmol/L (ref 98–111)
Creatinine, Ser: 1.48 mg/dL — ABNORMAL HIGH (ref 0.61–1.24)
GFR calc Af Amer: 53 mL/min — ABNORMAL LOW (ref >60)
GFR calc non Af Amer: 45 mL/min — ABNORMAL LOW (ref >60)
Glucose, Bld: 113 mg/dL — ABNORMAL HIGH (ref 70–99)
Potassium: 3.4 mmol/L — ABNORMAL LOW (ref 3.5–5.1)
Sodium: 136 mmol/L (ref 135–145)

## 2018-07-20 LAB — POCT ACTIVATED CLOTTING TIME: Activated Clotting Time: 367 seconds

## 2018-07-20 SURGERY — CORONARY STENT INTERVENTION
Anesthesia: LOCAL

## 2018-07-20 MED ORDER — SODIUM CHLORIDE 0.9% FLUSH: 3.0000 mL | INTRAVENOUS | Status: DC | PRN

## 2018-07-20 MED ORDER — METOPROLOL SUCCINATE ER 25 MG PO TB24
25.0000 mg | ORAL_TABLET | Freq: Every day | ORAL | Status: DC
  Administered 2018-07-20 – 2018-07-21 (×2): 25 mg via ORAL
  Filled 2018-07-20 (×2): qty 1

## 2018-07-20 MED ORDER — HEPARIN (PORCINE) IN NACL 1000-0.9 UT/500ML-% IV SOLN
INTRAVENOUS | Status: AC
  Filled 2018-07-20: qty 1500

## 2018-07-20 MED ORDER — SODIUM CHLORIDE 0.9 % WEIGHT BASED INFUSION: 1.0000 mL/kg/h | INTRAVENOUS | Status: AC

## 2018-07-20 MED ORDER — POTASSIUM CHLORIDE CRYS ER 20 MEQ PO TBCR
20.0000 meq | EXTENDED_RELEASE_TABLET | Freq: Once | ORAL | Status: AC
  Administered 2018-07-20: 20 meq via ORAL
  Filled 2018-07-20: qty 1

## 2018-07-20 MED ORDER — ENOXAPARIN SODIUM 40 MG/0.4ML ~~LOC~~ SOLN
40.0000 mg | SUBCUTANEOUS | Status: DC
  Administered 2018-07-21: 40 mg via SUBCUTANEOUS
  Filled 2018-07-20: qty 0.4

## 2018-07-20 MED ORDER — SODIUM CHLORIDE 0.9% FLUSH
3.0000 mL | Freq: Two times a day (BID) | INTRAVENOUS | Status: DC
  Administered 2018-07-20: 3 mL via INTRAVENOUS

## 2018-07-20 MED ORDER — HEPARIN SODIUM (PORCINE) 1000 UNIT/ML IJ SOLN
INTRAMUSCULAR | Status: DC | PRN
  Administered 2018-07-20: 8000 [IU] via INTRAVENOUS

## 2018-07-20 MED ORDER — IOHEXOL 350 MG/ML SOLN
INTRAVENOUS | Status: DC | PRN
  Administered 2018-07-20: 95 mL via INTRAVENOUS

## 2018-07-20 MED ORDER — LIDOCAINE HCL (PF) 1 % IJ SOLN
INTRAMUSCULAR | Status: AC
  Filled 2018-07-20: qty 30

## 2018-07-20 MED ORDER — MIDAZOLAM HCL 2 MG/2ML IJ SOLN
INTRAMUSCULAR | Status: AC
  Filled 2018-07-20: qty 2

## 2018-07-20 MED ORDER — FENTANYL CITRATE (PF) 100 MCG/2ML IJ SOLN
INTRAMUSCULAR | Status: DC | PRN
  Administered 2018-07-20: 25 ug via INTRAVENOUS

## 2018-07-20 MED ORDER — VERAPAMIL HCL 2.5 MG/ML IV SOLN
INTRAVENOUS | Status: AC
  Filled 2018-07-20: qty 2

## 2018-07-20 MED ORDER — MIDAZOLAM HCL 2 MG/2ML IJ SOLN
INTRAMUSCULAR | Status: DC | PRN
  Administered 2018-07-20: 1 mg via INTRAVENOUS

## 2018-07-20 MED ORDER — FENTANYL CITRATE (PF) 100 MCG/2ML IJ SOLN
INTRAMUSCULAR | Status: AC
  Filled 2018-07-20: qty 2

## 2018-07-20 MED ORDER — SODIUM CHLORIDE 0.9 % IV SOLN: 250.0000 mL | INTRAVENOUS | Status: DC | PRN

## 2018-07-20 MED ORDER — ANGIOPLASTY BOOK
Freq: Once | Status: AC
  Administered 2018-07-21: 06:00:00

## 2018-07-20 MED ORDER — LIDOCAINE HCL (PF) 1 % IJ SOLN
INTRAMUSCULAR | Status: DC | PRN
  Administered 2018-07-20: 2 mL

## 2018-07-20 MED ORDER — NITROGLYCERIN 1 MG/10 ML FOR IR/CATH LAB
INTRA_ARTERIAL | Status: AC
  Filled 2018-07-20: qty 10

## 2018-07-20 MED ORDER — HEART ATTACK BOUNCING BOOK
Freq: Once | Status: AC
  Administered 2018-07-21: 06:00:00

## 2018-07-20 MED ORDER — VERAPAMIL HCL 2.5 MG/ML IV SOLN
INTRAVENOUS | Status: DC | PRN
  Administered 2018-07-20: 10 mL via INTRA_ARTERIAL

## 2018-07-20 MED ORDER — HEPARIN (PORCINE) IN NACL 1000-0.9 UT/500ML-% IV SOLN
INTRAVENOUS | Status: DC | PRN
  Administered 2018-07-20 (×2): 500 mL

## 2018-07-20 SURGICAL SUPPLY — 15 items
BALLN SAPPHIRE 2.5X12 (BALLOONS) ×2
BALLN SAPPHIRE ~~LOC~~ 3.75X12 (BALLOONS) ×2 IMPLANT
BALLOON SAPPHIRE 2.5X12 (BALLOONS) ×1 IMPLANT
CATH LAUNCHER 6FR AL.75 (CATHETERS) ×2 IMPLANT
DEVICE RAD COMP TR BAND LRG (VASCULAR PRODUCTS) ×2 IMPLANT
GLIDESHEATH SLEND SS 6F .021 (SHEATH) ×2 IMPLANT
GUIDEWIRE INQWIRE 1.5J.035X260 (WIRE) ×1 IMPLANT
INQWIRE 1.5J .035X260CM (WIRE) ×2
KIT ENCORE 26 ADVANTAGE (KITS) ×2 IMPLANT
KIT HEART LEFT (KITS) ×2 IMPLANT
PACK CARDIAC CATHETERIZATION (CUSTOM PROCEDURE TRAY) ×2 IMPLANT
STENT SYNERGY DES 3.5X16 (Permanent Stent) ×2 IMPLANT
TRANSDUCER W/STOPCOCK (MISCELLANEOUS) ×2 IMPLANT
TUBING CIL FLEX 10 FLL-RA (TUBING) ×2 IMPLANT
WIRE ASAHI PROWATER 180CM (WIRE) ×2 IMPLANT

## 2018-07-20 NOTE — Interval H&P Note (Signed)
History and Physical Interval Note:  07/20/2018 10:44 AM  [name]  has presented today for surgery, with the diagnosis of Coronary disease.  The various methods of treatment have been discussed with the patient and family. After consideration of risks, benefits and other options for treatment, the patient has consented to  Procedure(s): CORONARY STENT INTERVENTION (N/A) as a surgical intervention.  The patient's history has been reviewed, patient examined, no change in status, stable for surgery.  I have reviewed the patient's chart and labs.  Questions were answered to the patient's satisfaction.   Cath Lab Visit (complete for each Cath Lab visit)  Clinical Evaluation Leading to the Procedure:   ACS: Yes.    Non-ACS:    Anginal Classification: CCS IV  Anti-ischemic medical therapy: Maximal Therapy (2 or more classes of medications)  Non-Invasive Test Results: No non-invasive testing performed  Prior CABG: No previous CABG       [facility] 07/20/2018 10:45 AM

## 2018-07-20 NOTE — Progress Notes (Signed)
Progress Note  Patient Name: [name] Date of Encounter: 07/20/2018  Primary Cardiologist: Carlyle Dolly, MD   Subjective   He is doing much better today. Denies any chest pain or dyspnea today. Did get SOB some with standing. Slept well.    Inpatient Medications    Scheduled Meds: . aspirin EC  81 mg Oral Daily  . enoxaparin (LOVENOX) injection  40 mg Subcutaneous Q24H  . potassium chloride  20 mEq Oral Once  . rosuvastatin  40 mg Oral q1800  . sodium chloride flush  3 mL Intravenous Q12H  . sodium chloride flush  3 mL Intravenous Q12H  . ticagrelor  90 mg Oral BID   Continuous Infusions: . sodium chloride    . sodium chloride    . sodium chloride 1 mL/kg/hr (07/20/18 0534)  . nitroGLYCERIN Stopped (07/20/18 0516)   PRN Meds: sodium chloride, sodium chloride, acetaminophen, ondansetron (ZOFRAN) IV, sodium chloride flush, sodium chloride flush   Vital Signs    Vitals:   07/20/18 0400 07/20/18 0500 07/20/18 0600 07/20/18 0700  BP: 111/86 95/70 (!) 88/63 99/73  Pulse: 67 68 70 67  Resp: 14 15 14 17   Temp: 98.4 F (36.9 C)     TempSrc: Oral     SpO2: 97% 98% 99% 99%  Weight:      Height:        Intake/Output Summary (Last 24 hours) at 07/20/2018 0736 Last data filed at 07/20/2018 0700 Gross per 24 hour  Intake 368.34 ml  Output 675 ml  Net -306.66 ml   Last 3 Weights 07/18/2018 05/11/2018 02/12/2018  Weight (lbs) 185 lb 207 lb 205 lb  Weight (kg) 83.915 kg 93.895 kg 92.987 kg      Telemetry    NSR with occ. PVCs. No VT - Personally Reviewed  ECG    None today  - Personally Reviewed  Physical Exam   GEN: No acute distress.   Neck: No JVD Cardiac: RRR, no murmurs, rubs, or gallops.  Respiratory: Clear to auscultation bilaterally. GI: Soft, nontender, non-distended  MS: No edema; No deformity. Neuro:  Nonfocal  Psych: Normal affect   Labs    Chemistry Recent Labs  Lab 07/18/18 2148 07/19/18 0244 07/20/18 0207  NA 139 139 136  K 3.9  3.7 3.4*  CL 105 101 105  CO2 20* 26 25  GLUCOSE 130* 112* 113*  BUN 17 18 27*  CREATININE 1.41* 1.47* 1.48*  CALCIUM 9.4 9.2 8.5*  GFRNONAA 48* 46* 45*  GFRAA 56* 53* 53*  ANIONGAP 14 12 6      Hematology Recent Labs  Lab 07/18/18 1233 07/19/18 0244  WBC 7.0 10.8*  RBC 4.88 5.13  HGB 14.3 15.5  HCT 44.5 45.8  MCV 91.2 89.3  MCH 29.3 30.2  MCHC 32.1 33.8  RDW 13.3 13.3  PLT 243 269    Cardiac Enzymes Recent Labs  Lab 07/18/18 1600 07/18/18 2041 07/19/18 0244  TROPONINI 36.70* >65.00* >65.00*    Recent Labs  Lab 07/18/18 1236  TROPIPOC 0.26*     BNPNo results for input(s): BNP, PROBNP in the last 168 hours.   DDimer No results for input(s): DDIMER in the last 168 hours.   Radiology    Dg Chest Portable 1 View  Result Date: 07/18/2018 CLINICAL DATA:  Acute onset of generalized chest pain, radiating to the left arm. EXAM: PORTABLE CHEST 1 VIEW COMPARISON:  Chest radiograph performed 08/11/2017 FINDINGS: The lungs are well-aerated and clear. There is no evidence  of focal opacification, pleural effusion or pneumothorax. The cardiomediastinal silhouette is within normal limits. No acute osseous abnormalities are seen. IMPRESSION: No acute cardiopulmonary process seen. Electronically Signed   By: Garald Balding M.D.   On: 07/18/2018 13:51   Dg Chest Port 1v Same Day  Result Date: 07/19/2018 CLINICAL DATA:  Short of breath and dizziness EXAM: PORTABLE CHEST 1 VIEW COMPARISON:  07/18/2018 FINDINGS: Heart size within normal limits. Left coronary stent. Atherosclerotic aortic arch. Negative for heart failure. Lungs are clear without infiltrate or effusion. Apical scarring bilaterally. IMPRESSION: No active disease. Electronically Signed   By: Franchot Gallo M.D.   On: 07/19/2018 13:55    Cardiac Studies   Procedures   Coronary/Graft Acute MI Revascularization  CORONARY STENT INTERVENTION  LEFT HEART CATH AND CORONARY ANGIOGRAPHY  Conclusion     Previously  placed Mid RCA to Dist RCA stent (unknown type) is widely patent.  Ost RCA to Mid RCA lesion is 30% stenosed.  Mid RCA lesion is 95% stenosed.  Ost 1st Mrg to 1st Mrg lesion is 100% stenosed.  Ost LAD to Prox LAD lesion is 60% stenosed.  Prox LAD lesion is 25% stenosed.  Ost 1st Diag to 1st Diag lesion is 95% stenosed.  Mid LAD to Dist LAD lesion is 99% stenosed.  Post intervention, there is a 0% residual stenosis.  A drug-eluting stent was successfully placed using a STENT SYNERGY DES 3X24.  LV end diastolic pressure is moderately elevated.   1. 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 Diffuse 99% stenosis in the mid to distal LAD. 95% bifurcating first diagonal.    - 100% occlusion of the large first OM at site of prior stent. This is the culprit lesion   - 95% mid RCA in stent 2. Moderately elevated LVEDP 3. Successful PCI of the OM 1 with DES x1  Discussed with Dr. Gwenlyn Found. There are no options for treating the LAD. It is diffusely diseased and there is no target for CABG. There are some right to left collaterals to the distal LAD. We proceeded with emergent PCI of the OM1. Recommend DAPT indefinitely given multiple layers of stent. Plan to bring patient back in 2 days for PCI of the RCA. Will treat the LAD medically. Check LV function by Echo. Maximize medical therapy. Stress importance of medication compliance.    Echo: IMPRESSIONS    1. The left ventricle has low normal systolic function, with an ejection fraction of 50-55%. The cavity size was normal. Left ventricular diastolic Doppler parameters are indeterminate.  2. Mild hypokinesis of the left ventricular, mid-apical inferoseptal wall, inferior segment and inferolateral wall.  3. The right ventricle has normal systolic function. The cavity was normal. There is no increase in right ventricular wall thickness.  4. The aortic valve is tricuspid Mild thickening of the aortic  valve Mild calcification of the aortic valve. Aortic valve regurgitation was not assessed by color flow Doppler.   Patient Profile     78 y.o. male with PMH of CAD s/p multiple stents to RCA, and BMS to OM, HTN, HL, and noncompliance who presented to the ED with ongoing chest pain from PCP office. Ecg c/w acute posterior STEMI  Assessment & Plan    1. Acute posterior STEMI secondary to occlusion of OM1. Previously stented with BMS. Now s/p emergent stenting with DES. Troponin > 65. EF Mildly reduced by Echo. Patient has severe 3 vessel disease. LAD is very  aneurysmal proximally and severely and diffusely diseased in the mid to distal vessel. Not amenable to PCI. Poor target for bypass. Also severe in stent stenosis in the mid RCA. Patient had recurrent chest pain yesterday requiring IV Ntg. Plan PCI of the RCA today. Renal function is stable. Hydrating this am.  Would recommend life long DAPT. Currently on IV Ntg. Beta blocker held due to hypotension. On high dose statin. 2. Hypercholesterolemia. LDL > 200. Now on high dose Crestor. If this does not yield good control consider PCSK 9 inhibitor.  3. HTN controlled.  4. Acute combined systolic/diastolic CHF. EF 50%. EDP 29 mm at cath. Excellent response to IV lasix x 1. Will hold further diuresis due to elevated creatinine.  5. NSVT. Post reperfusion for STEMI. Asymptomatic. Improved today.  6. Acute on CKD stage 3. Creatinine increased from 1.36 to 1.47 post cath and with diuresis. Stable at 1.48 today. Will try and minimize any further contrast.  For questions or updates, please contact [place] Please consult [email or web address] for contact info under        Signed, Denesia Donelan [place], MD  07/20/2018, 7:36 AM

## 2018-07-20 NOTE — Progress Notes (Signed)
TR BAND REMOVAL  LOCATION:    right radial  DEFLATED PER PROTOCOL:    Yes.    TIME BAND OFF / DRESSING APPLIED:    1830   SITE UPON ARRIVAL:    Level 0  SITE AFTER BAND REMOVAL:    Level 0  CIRCULATION SENSATION AND MOVEMENT:    Within Normal Limits   Yes.    COMMENTS:   Oozing noted through out deflation and caused delay in band removal. Band removed at 1830 and oozing from site noted, not arterial, pressure held for 20 minutes and no further oozing, dressing applied at 1850. Recheck at 1900 dressing dry and intact, radial pulse +2, capillary refill wnl, no bleeding, oozing or hematoma. Old bruising from prior site noted but no new bruising.

## 2018-07-20 NOTE — Progress Notes (Signed)
Patients wife reported bleeding in groin area. Left groin cleaned of dried blood and minuet scratch noted with no active bleeding.  Scrotum also had dried blood, when cleaned noted red areas underskin that looked like petechiae all over the scrotum. Reported to J. Phylliss Bob NP. Will continue to monitor for change and CBC scheduled for tomorrow am.

## 2018-07-20 NOTE — H&P (View-Only) (Signed)
Progress Note  Patient Name: [name] Date of Encounter: 07/20/2018  Primary Cardiologist: Carlyle Dolly, MD   Subjective   He is doing much better today. Denies any chest pain or dyspnea today. Did get SOB some with standing. Slept well.    Inpatient Medications    Scheduled Meds: . aspirin EC  81 mg Oral Daily  . enoxaparin (LOVENOX) injection  40 mg Subcutaneous Q24H  . potassium chloride  20 mEq Oral Once  . rosuvastatin  40 mg Oral q1800  . sodium chloride flush  3 mL Intravenous Q12H  . sodium chloride flush  3 mL Intravenous Q12H  . ticagrelor  90 mg Oral BID   Continuous Infusions: . sodium chloride    . sodium chloride    . sodium chloride 1 mL/kg/hr (07/20/18 0534)  . nitroGLYCERIN Stopped (07/20/18 0516)   PRN Meds: sodium chloride, sodium chloride, acetaminophen, ondansetron (ZOFRAN) IV, sodium chloride flush, sodium chloride flush   Vital Signs    Vitals:   07/20/18 0400 07/20/18 0500 07/20/18 0600 07/20/18 0700  BP: 111/86 95/70 (!) 88/63 99/73  Pulse: 67 68 70 67  Resp: 14 15 14 17   Temp: 98.4 F (36.9 C)     TempSrc: Oral     SpO2: 97% 98% 99% 99%  Weight:      Height:        Intake/Output Summary (Last 24 hours) at 07/20/2018 0736 Last data filed at 07/20/2018 0700 Gross per 24 hour  Intake 368.34 ml  Output 675 ml  Net -306.66 ml   Last 3 Weights 07/18/2018 05/11/2018 02/12/2018  Weight (lbs) 185 lb 207 lb 205 lb  Weight (kg) 83.915 kg 93.895 kg 92.987 kg      Telemetry    NSR with occ. PVCs. No VT - Personally Reviewed  ECG    None today  - Personally Reviewed  Physical Exam   GEN: No acute distress.   Neck: No JVD Cardiac: RRR, no murmurs, rubs, or gallops.  Respiratory: Clear to auscultation bilaterally. GI: Soft, nontender, non-distended  MS: No edema; No deformity. Neuro:  Nonfocal  Psych: Normal affect   Labs    Chemistry Recent Labs  Lab 07/18/18 2148 07/19/18 0244 07/20/18 0207  NA 139 139 136  K 3.9  3.7 3.4*  CL 105 101 105  CO2 20* 26 25  GLUCOSE 130* 112* 113*  BUN 17 18 27*  CREATININE 1.41* 1.47* 1.48*  CALCIUM 9.4 9.2 8.5*  GFRNONAA 48* 46* 45*  GFRAA 56* 53* 53*  ANIONGAP 14 12 6      Hematology Recent Labs  Lab 07/18/18 1233 07/19/18 0244  WBC 7.0 10.8*  RBC 4.88 5.13  HGB 14.3 15.5  HCT 44.5 45.8  MCV 91.2 89.3  MCH 29.3 30.2  MCHC 32.1 33.8  RDW 13.3 13.3  PLT 243 269    Cardiac Enzymes Recent Labs  Lab 07/18/18 1600 07/18/18 2041 07/19/18 0244  TROPONINI 36.70* >65.00* >65.00*    Recent Labs  Lab 07/18/18 1236  TROPIPOC 0.26*     BNPNo results for input(s): BNP, PROBNP in the last 168 hours.   DDimer No results for input(s): DDIMER in the last 168 hours.   Radiology    Dg Chest Portable 1 View  Result Date: 07/18/2018 CLINICAL DATA:  Acute onset of generalized chest pain, radiating to the left arm. EXAM: PORTABLE CHEST 1 VIEW COMPARISON:  Chest radiograph performed 08/11/2017 FINDINGS: The lungs are well-aerated and clear. There is no evidence  of focal opacification, pleural effusion or pneumothorax. The cardiomediastinal silhouette is within normal limits. No acute osseous abnormalities are seen. IMPRESSION: No acute cardiopulmonary process seen. Electronically Signed   By: Garald Balding M.D.   On: 07/18/2018 13:51   Dg Chest Port 1v Same Day  Result Date: 07/19/2018 CLINICAL DATA:  Short of breath and dizziness EXAM: PORTABLE CHEST 1 VIEW COMPARISON:  07/18/2018 FINDINGS: Heart size within normal limits. Left coronary stent. Atherosclerotic aortic arch. Negative for heart failure. Lungs are clear without infiltrate or effusion. Apical scarring bilaterally. IMPRESSION: No active disease. Electronically Signed   By: Franchot Gallo M.D.   On: 07/19/2018 13:55    Cardiac Studies   Procedures   Coronary/Graft Acute MI Revascularization  CORONARY STENT INTERVENTION  LEFT HEART CATH AND CORONARY ANGIOGRAPHY  Conclusion     Previously  placed Mid RCA to Dist RCA stent (unknown type) is widely patent.  Ost RCA to Mid RCA lesion is 30% stenosed.  Mid RCA lesion is 95% stenosed.  Ost 1st Mrg to 1st Mrg lesion is 100% stenosed.  Ost LAD to Prox LAD lesion is 60% stenosed.  Prox LAD lesion is 25% stenosed.  Ost 1st Diag to 1st Diag lesion is 95% stenosed.  Mid LAD to Dist LAD lesion is 99% stenosed.  Post intervention, there is a 0% residual stenosis.  A drug-eluting stent was successfully placed using a STENT SYNERGY DES 3X24.  LV end diastolic pressure is moderately elevated.   1. 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 Diffuse 99% stenosis in the mid to distal LAD. 95% bifurcating first diagonal.    - 100% occlusion of the large first OM at site of prior stent. This is the culprit lesion   - 95% mid RCA in stent 2. Moderately elevated LVEDP 3. Successful PCI of the OM 1 with DES x1  Discussed with Dr. Gwenlyn Found. There are no options for treating the LAD. It is diffusely diseased and there is no target for CABG. There are some right to left collaterals to the distal LAD. We proceeded with emergent PCI of the OM1. Recommend DAPT indefinitely given multiple layers of stent. Plan to bring patient back in 2 days for PCI of the RCA. Will treat the LAD medically. Check LV function by Echo. Maximize medical therapy. Stress importance of medication compliance.    Echo: IMPRESSIONS    1. The left ventricle has low normal systolic function, with an ejection fraction of 50-55%. The cavity size was normal. Left ventricular diastolic Doppler parameters are indeterminate.  2. Mild hypokinesis of the left ventricular, mid-apical inferoseptal wall, inferior segment and inferolateral wall.  3. The right ventricle has normal systolic function. The cavity was normal. There is no increase in right ventricular wall thickness.  4. The aortic valve is tricuspid Mild thickening of the aortic  valve Mild calcification of the aortic valve. Aortic valve regurgitation was not assessed by color flow Doppler.   Patient Profile     78 y.o. male with PMH of CAD s/p multiple stents to RCA, and BMS to OM, HTN, HL, and noncompliance who presented to the ED with ongoing chest pain from PCP office. Ecg c/w acute posterior STEMI  Assessment & Plan    1. Acute posterior STEMI secondary to occlusion of OM1. Previously stented with BMS. Now s/p emergent stenting with DES. Troponin > 65. EF Mildly reduced by Echo. Patient has severe 3 vessel disease. LAD is very  aneurysmal proximally and severely and diffusely diseased in the mid to distal vessel. Not amenable to PCI. Poor target for bypass. Also severe in stent stenosis in the mid RCA. Patient had recurrent chest pain yesterday requiring IV Ntg. Plan PCI of the RCA today. Renal function is stable. Hydrating this am.  Would recommend life long DAPT. Currently on IV Ntg. Beta blocker held due to hypotension. On high dose statin. 2. Hypercholesterolemia. LDL > 200. Now on high dose Crestor. If this does not yield good control consider PCSK 9 inhibitor.  3. HTN controlled.  4. Acute combined systolic/diastolic CHF. EF 50%. EDP 29 mm at cath. Excellent response to IV lasix x 1. Will hold further diuresis due to elevated creatinine.  5. NSVT. Post reperfusion for STEMI. Asymptomatic. Improved today.  6. Acute on CKD stage 3. Creatinine increased from 1.36 to 1.47 post cath and with diuresis. Stable at 1.48 today. Will try and minimize any further contrast.  For questions or updates, please contact [place] Please consult [email or web address] for contact info under        Signed, Ruthanne Mcneish [place], MD  07/20/2018, 7:36 AM

## 2018-07-21 ENCOUNTER — Other Ambulatory Visit: Payer: Self-pay | Admitting: *Deleted

## 2018-07-21 DIAGNOSIS — Z955 Presence of coronary angioplasty implant and graft: Secondary | ICD-10-CM

## 2018-07-21 DIAGNOSIS — N179 Acute kidney failure, unspecified: Secondary | ICD-10-CM

## 2018-07-21 LAB — BASIC METABOLIC PANEL
Anion gap: 9 (ref 5–15)
BUN: 20 mg/dL (ref 8–23)
CO2: 24 mmol/L (ref 22–32)
Calcium: 8.7 mg/dL — ABNORMAL LOW (ref 8.9–10.3)
Chloride: 106 mmol/L (ref 98–111)
Creatinine, Ser: 1.31 mg/dL — ABNORMAL HIGH (ref 0.61–1.24)
GFR calc Af Amer: >60 mL/min (ref >60)
GFR calc non Af Amer: 53 mL/min — ABNORMAL LOW (ref >60)
Glucose, Bld: 120 mg/dL — ABNORMAL HIGH (ref 70–99)
Potassium: 3.4 mmol/L — ABNORMAL LOW (ref 3.5–5.1)
Sodium: 139 mmol/L (ref 135–145)

## 2018-07-21 LAB — CBC
HCT: 41.8 % (ref 39.0–52.0)
Hemoglobin: 13.5 g/dL (ref 13.0–17.0)
MCH: 29.1 pg (ref 26.0–34.0)
MCHC: 32.3 g/dL (ref 30.0–36.0)
MCV: 90.1 fL (ref 80.0–100.0)
Platelets: 212 10*3/uL (ref 150–400)
RBC: 4.64 MIL/uL (ref 4.22–5.81)
RDW: 13.1 % (ref 11.5–15.5)
WBC: 6.9 10*3/uL (ref 4.0–10.5)
nRBC: 0 % (ref 0.0–0.2)

## 2018-07-21 MED ORDER — ROSUVASTATIN CALCIUM 40 MG PO TABS: 40.0000 mg | ORAL_TABLET | Freq: Every day | ORAL | 3 refills | Status: DC

## 2018-07-21 MED ORDER — TICAGRELOR 90 MG PO TABS: 90.0000 mg | ORAL_TABLET | Freq: Two times a day (BID) | ORAL | 11 refills | Status: DC

## 2018-07-21 MED ORDER — POTASSIUM CHLORIDE CRYS ER 20 MEQ PO TBCR
40.0000 meq | EXTENDED_RELEASE_TABLET | Freq: Once | ORAL | Status: AC
  Administered 2018-07-21: 40 meq via ORAL
  Filled 2018-07-21: qty 2

## 2018-07-21 MED ORDER — TICAGRELOR 90 MG PO TABS: 90.0000 mg | ORAL_TABLET | Freq: Two times a day (BID) | ORAL | 0 refills | Status: DC

## 2018-07-21 MED ORDER — ASPIRIN 81 MG PO TBEC: 81.0000 mg | DELAYED_RELEASE_TABLET | Freq: Every day | ORAL | Status: DC

## 2018-07-21 MED ORDER — METOPROLOL SUCCINATE ER 25 MG PO TB24: 25.0000 mg | ORAL_TABLET | Freq: Every day | ORAL | 3 refills | Status: DC

## 2018-07-21 MED ORDER — NITROGLYCERIN 0.4 MG SL SUBL: 0.4000 mg | SUBLINGUAL_TABLET | SUBLINGUAL | 3 refills | Status: DC | PRN

## 2018-07-21 NOTE — Progress Notes (Signed)
CARDIAC REHAB PHASE I   Pre:  BP 134/93 O2: 98% RA  HR: 70 SR   Arrived to pt's room, PA visiting pt. Pt just received breakfast, began education while pt ate. Reviewed risk factors, MI booklet, restrictions, heart healthy nutrition, phase II cardiac rehab, exercise guidelines and antiplatelet therapy. Will send phase II referral to [facility] per pt's request. Education was completed with pt. Family arrived.  Pt still eating once ed complete. Encouraged pt to walk before being discharged after eating. Pt verbalized understanding. RN notified regarding ambulation.   8:49-9:49am   Carma Lair MS, ACSM CEP  9:39 AM 07/21/2018

## 2018-07-21 NOTE — Progress Notes (Signed)
Pt ambulates with RN in the room. Pt stable, dc home with family via wheelchair.

## 2018-07-21 NOTE — Discharge Summary (Addendum)
Discharge Summary    Patient ID: [name],  MRN: [id number], DOB/AGE: 77/04/1941 77 y.o.  Admit date: 07/18/2018 Discharge date: 07/21/2018  Primary Care Provider: Claretta Fraise Primary Cardiologist: Carlyle Dolly, MD   Discharge Diagnoses    Principal Problem:   STEMI involving left circumflex coronary artery [facility]) Active Problems:   Essential hypertension, benign   ASCVD (arteriosclerotic cardiovascular disease)   Hyperlipidemia with target LDL less than 70   Acute renal failure superimposed on stage 3 chronic kidney disease ([place])   Acute combined systolic and diastolic heart failure ([place])   History of Present Illness     [name] is a 78 y.o. male with past medical history of CAD (s/p prior stenting to RCA and PDA in 2004, s/p BMS to OM in 2007), HTN, HLD, prior GIB, and Stage 3 CKD who presented to [facility] 07/18/2018 for evaluation of chest pain occurring for the past 4 days leading to admission.   He reported that on the weekend prior to admission he had been working in the garden and carrying bricks but developed discomfort later that afternoon which resolved with sublingual nitroglycerin. He continued to have intermittent episodes of chest pain since then and was evaluated by his PCP on the day of admission and was transported via EMS to [facility] for further cardiac evaluation. His EKG showed 2 mm ST depression along the inferior lateral leads and initial troponin was elevated to 0.26.  He developed recurrent chest pain while in the ED and repeat EKG was consistent with acute posterior STEMI. He was therefore taken for an emergent cardiac catheterization.   Hospital Course     Consultants: None  This showed severe 3-vessel CAD with severe aneurysmal disease in the proximal LAD, 99% stenosis mid-distal LAD, 95% bifurcating D1, 100% percent occlusion of the large first OM, and 95% mid RCA stenosis along previously placed stent. He underwent successful  PCI of the OM1 with DES x1. There were felt to be no options for treating the LAD given the diffuse disease and no target for CABG. Did have right to left collaterals to the distal LAD. Was recommended to undergo staged PCI of the RCA in several days following his initial cath.  The following morning, he reported intermittent dyspnea but his chest discomfort had resolved. An echocardiogram was performed and showed his EF was mildly reduced at 50 to 55% with hypokinesis of the apical inferior septal and inferior lateral wall. EDP was elevated to 29 at the time of cardiac catheterization and he received IV Lasix x1. Further diuresis was not administered as creatinine trended upwards from 1.36 to 1.47.  He did undergo staged PCI of the RCA on 07/20/2018 with DES placed to the 95% mid RCA stenosis. Was recommended to continue lifelong DAPT. Was started on ASA and Brilinta but may need to switch to Plavix as co-pay for Brilinta was listed as being greater than $300 per Case Management.   The following morning, he reported doing well and denied any chest pain. Was having intermittent dyspnea after taking Brilinta. Labs showed stable hemoglobin of 13.5 and he was stable at 1.31.  K+ was slightly low at 3.4 and replaced. He did have petechiae along his scrotum which was not present prior to admission per the patient's report. Denied any pain, itching, or change in urination. Thought to possibly be secondary to an allergic reaction to the urinal as he his cath was performed from a radial perspective.  Radial cath site looked stable with minimal ecchymosis or evidence of a hematoma.   He was last examined Dr. Bronson Ing and deemed stable for discharge. 2-week follow-up has been arranged in our [place] office. He was discharged home in stable condition.   _____________  Discharge Vitals and Physical Exam Blood pressure 119/86, pulse 74, temperature 98.7 F (37.1 C), temperature source Oral, resp. rate 20, height  6\' 2"  (1.88 m), weight 83.9 kg, SpO2 95 %.  Filed Weights   07/18/18 1227 07/20/18 1204 07/21/18 0538  Weight: 83.9 kg 83.9 kg 83.9 kg    General: Pleasant, Caucasian male appearing in NAD Psych: Normal affect. Neuro: Alert and oriented X 3. Moves all extremities spontaneously. HEENT: Normal  Neck: Supple without bruits or JVD. Lungs:  Resp regular and unlabored, CTA without wheezing or rales. Heart: RRR no s3, s4, or murmurs. Abdomen: Soft, non-tender, non-distended, BS + x 4.  Extremities: No clubbing, cyanosis or edema. DP/PT/Radials 2+ and equal bilaterally. Radial site with mild ecchymosis and no evidence of a hematoma.    Labs & Radiologic Studies     CBC Recent Labs    07/19/18 0244 07/21/18 0226  WBC 10.8* 6.9  HGB 15.5 13.5  HCT 45.8 41.8  MCV 89.3 90.1  PLT 269 259   Basic Metabolic Panel Recent Labs    07/20/18 0207 07/21/18 0226  NA 136 139  K 3.4* 3.4*  CL 105 106  CO2 25 24  GLUCOSE 113* 120*  BUN 27* 20  CREATININE 1.48* 1.31*  CALCIUM 8.5* 8.7*   Liver Function Tests No results for input(s): AST, ALT, ALKPHOS, BILITOT, PROT, ALBUMIN in the last 72 hours. No results for input(s): LIPASE, AMYLASE in the last 72 hours. Cardiac Enzymes Recent Labs    07/18/18 1600 07/18/18 2041 07/19/18 0244  TROPONINI 36.70* >65.00* >65.00*   BNP Invalid input(s): POCBNP D-Dimer No results for input(s): DDIMER in the last 72 hours. Hemoglobin A1C No results for input(s): HGBA1C in the last 72 hours. Fasting Lipid Panel No results for input(s): CHOL, HDL, LDLCALC, TRIG, CHOLHDL, LDLDIRECT in the last 72 hours. Thyroid Function Tests No results for input(s): TSH, T4TOTAL, T3FREE, THYROIDAB in the last 72 hours.  Invalid input(s): FREET3  Dg Chest Portable 1 View  Result Date: 07/18/2018 CLINICAL DATA:  Acute onset of generalized chest pain, radiating to the left arm. EXAM: PORTABLE CHEST 1 VIEW COMPARISON:  Chest radiograph performed 08/11/2017  FINDINGS: The lungs are well-aerated and clear. There is no evidence of focal opacification, pleural effusion or pneumothorax. The cardiomediastinal silhouette is within normal limits. No acute osseous abnormalities are seen. IMPRESSION: No acute cardiopulmonary process seen. Electronically Signed   By: Garald Balding M.D.   On: 07/18/2018 13:51   Dg Chest Port 1v Same Day  Result Date: 07/19/2018 CLINICAL DATA:  Short of breath and dizziness EXAM: PORTABLE CHEST 1 VIEW COMPARISON:  07/18/2018 FINDINGS: Heart size within normal limits. Left coronary stent. Atherosclerotic aortic arch. Negative for heart failure. Lungs are clear without infiltrate or effusion. Apical scarring bilaterally. IMPRESSION: No active disease. Electronically Signed   By: Franchot Gallo M.D.   On: 07/19/2018 13:55     Diagnostic Studies/Procedures     Cardiac Catheterization: 07/18/2018  Previously placed Mid RCA to Dist RCA stent (unknown type) is widely patent.  Ost RCA to Mid RCA lesion is 30% stenosed.  Mid RCA lesion is 95% stenosed.  Ost 1st Mrg to 1st Mrg lesion is 100% stenosed.  Ost LAD to  Prox LAD lesion is 60% stenosed.  Prox LAD lesion is 25% stenosed.  Ost 1st Diag to 1st Diag lesion is 95% stenosed.  Mid LAD to Dist LAD lesion is 99% stenosed.  Post intervention, there is a 0% residual stenosis.  A drug-eluting stent was successfully placed using a STENT SYNERGY DES 3X24.  LV end diastolic pressure is moderately elevated.   1. 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 Diffuse 99% stenosis in the mid to distal LAD. 95% bifurcating first diagonal.    - 100% occlusion of the large first OM at site of prior stent. This is the culprit lesion   - 95% mid RCA in stent 2. Moderately elevated LVEDP 3. Successful PCI of the OM 1 with DES x1  Discussed with Dr. Gwenlyn Found. There are no options for treating the LAD. It is diffusely diseased and there is no target  for CABG. There are some right to left collaterals to the distal LAD. We proceeded with emergent PCI of the OM1. Recommend DAPT indefinitely given multiple layers of stent. Plan to bring patient back in 2 days for PCI of the RCA. Will treat the LAD medically. Check LV function by Echo. Maximize medical therapy. Stress importance of medication compliance.   Echocardiogram: 07/18/2018 IMPRESSIONS  1. The left ventricle has low normal systolic function, with an ejection fraction of 50-55%. The cavity size was normal. Left ventricular diastolic Doppler parameters are indeterminate.  2. Mild hypokinesis of the left ventricular, mid-apical inferoseptal wall, inferior segment and inferolateral wall.  3. The right ventricle has normal systolic function. The cavity was normal. There is no increase in right ventricular wall thickness.  4. The aortic valve is tricuspid Mild thickening of the aortic valve Mild calcification of the aortic valve. Aortic valve regurgitation was not assessed by color flow Doppler.  Coronary Stent Intervention: 07/20/2018  Mid RCA lesion is 95% stenosed.  A drug-eluting stent was successfully placed using a STENT SYNERGY DES 3.5X16.  Post intervention, there is a 0% residual stenosis.   1. Successful PCI of the mid RCA with DES x 1.  Plan: continue DAPT indefinitely. Anticipate DC home in am.  Disposition   Pt is being discharged home today in good condition.  Follow-up Plans & Appointments    Follow-up Information    Erma Heritage, PA-C Follow up on 08/08/2018.   Specialties:  Physician Assistant, Cardiology Why:  Cardiology Hospital Follow-Up on 08/08/2018 at 3:30 with Bernerd Pho, PA-C (works with Dr. Harl Bowie) Contact information: [place] [address] [phone]          Discharge Instructions    AMB Referral to Cardiac Rehabilitation - Phase II   Complete by:  As directed    Diagnosis:  STEMI   Diet - low sodium heart healthy    Complete by:  As directed    Discharge instructions   Complete by:  As directed    PLEASE REMEMBER TO [place].  PLEASE ATTEND ALL SCHEDULED FOLLOW-UP APPOINTMENTS.   Activity: Increase activity slowly as tolerated. You may shower, but no soaking baths (or swimming) for 1 week. No driving for 1 week. No lifting over 10 lbs for 2 weeks. No sexual activity for 2 weeks.   You May Return to Work: in 1 week (if applicable)  Wound Care: You may wash cath site gently with soap and water. Keep cath  site clean and dry. If you notice pain, swelling, bleeding or pus at your cath site, please call [phone].   Increase activity slowly   Complete by:  As directed       Discharge Medications     Medication List    STOP taking these medications   atorvastatin 80 MG tablet Commonly known as:  LIPITOR   traZODone 100 MG tablet Commonly known as:  DESYREL     TAKE these medications   aspirin 81 MG EC tablet Take 1 tablet (81 mg total) by mouth daily. Start taking on:  July 22, 2018   metoprolol succinate 25 MG 24 hr tablet Commonly known as:  TOPROL-XL Take 1 tablet (25 mg total) by mouth daily. What changed:    medication strength  how much to take   nitroGLYCERIN 0.4 MG SL tablet Commonly known as:  NITROSTAT Place 1 tablet (0.4 mg total) under the tongue every 5 (five) minutes as needed for chest pain.   rosuvastatin 40 MG tablet Commonly known as:  CRESTOR Take 1 tablet (40 mg total) by mouth daily at 6 PM.   ticagrelor 90 MG Tabs tablet Commonly known as:  BRILINTA Take 1 tablet (90 mg total) by mouth 2 (two) times daily.         Allergies Allergies  Allergen Reactions  . Ibuprofen Nausea Only     Acute coronary syndrome (MI, NSTEMI, STEMI, etc) this admission?: Yes.     AHA/ACC Clinical Performance & Quality Measures: 1. Aspirin prescribed? - Yes 2. ADP Receptor Inhibitor (Plavix/Clopidogrel,  Brilinta/Ticagrelor or Effient/Prasugrel) prescribed (includes medically managed patients)? - Yes 3. Beta Blocker prescribed? - Yes 4. High Intensity Statin (Lipitor 40-80mg  or Crestor 20-40mg ) prescribed? - Yes 5. EF assessed during THIS hospitalization? - Yes 6. For EF <40%, was ACEI/ARB prescribed? - Not Applicable (EF >/= 02%) 7. For EF <40%, Aldosterone Antagonist (Spironolactone or Eplerenone) prescribed? - Not Applicable (EF >/= 77%) 8. Cardiac Rehab Phase II ordered (Included Medically managed Patients)? - Yes    Outstanding Labs/Studies   FLP and LFT's in 6-8 weeks.   Duration of Discharge Encounter   Greater than 30 minutes including physician time.  Signed, Erma Heritage, PA-C 07/21/2018, 9:17 AM   The patient was seen and examined, and I agree with the history, physical exam, assessment and plan as documented above.  He is doing well today and denies anginal symptoms.  He initially underwent successful PCI of the OM1 with a drug-eluting stent for posterior STEMI.  He subsequently underwent staged RCA intervention with DES on 3/13.  He will need indefinite DAPT but Brilinta will likely have to be switched to Plavix due to cost.  He will be discharged today. He will require cardiac rehabilitation as outpatient.  Kate Sable, MD, [facility]  07/21/2018 9:33 AM

## 2018-07-21 NOTE — Discharge Instructions (Signed)
Coronary Angiogram With Stent, Care After  This sheet gives you information about how to care for yourself after your procedure. Your health care provider may also give you more specific instructions. If you have problems or questions, contact your health care provider.  What can I expect after the procedure?  After your procedure, it is common to have:   Bruising in the area where a small, thin tube (catheter) was inserted. This usually fades within 1-2 weeks.   Blood collecting in the tissue (hematoma) that may be painful to the touch. It should usually decrease in size and tenderness within 1-2 weeks.  Follow these instructions at home:  Insertion area care   [name] not take baths, swim, or use a hot tub until your health care provider approves.   You may shower 24-48 hours after the procedure or as directed by your health care provider.   Follow instructions from your health care provider about how to take care of your incision. Make sure you:  ? Wash your hands with soap and water before you change your bandage (dressing). If soap and water are not available, use hand sanitizer.  ? Change your dressing as told by your health care provider.  ? Leave stitches (sutures), skin glue, or adhesive strips in place. These skin closures may need to stay in place for 2 weeks or longer. If adhesive strip edges start to loosen and curl up, you may trim the loose edges. [name] not remove adhesive strips completely unless your health care provider tells you to [name] that.   Remove the bandage (dressing) and gently wash the catheter insertion site with plain soap and water.   Pat the area dry with a clean towel. [name] not rub the area, because that may cause bleeding.   [name] not apply powder or lotion to the incision area.   Check your incision area every day for signs of infection. Check for:  ? More redness, swelling, or pain.  ? More fluid or blood.  ? Warmth.  ? Pus or a bad smell.  Activity   [name] not drive for 24 hours if you  were given a medicine to help you relax (sedative).   [name] not lift anything that is heavier than 10 lb (4.5 kg) for 5 days after your procedure or as directed by your health care provider.   Ask your health care provider when it is okay for you:  ? To return to work or school.  ? To resume usual physical activities or sports.  ? To resume sexual activity.  Eating and drinking     Eat a heart-healthy diet. This should include plenty of fresh fruits and vegetables.   Avoid the following types of food:  ? Food that is high in salt.  ? Canned or highly processed food.  ? Food that is high in saturated fat or sugar.  ? Fried food.   Limit alcohol intake to no more than 1 drink a day for non-pregnant women and 2 drinks a day for men. One drink equals 12 oz of beer, 5 oz of wine, or 1 oz of hard liquor.  Lifestyle     [name] not use any products that contain nicotine or tobacco, such as cigarettes and e-cigarettes. If you need help quitting, ask your health care provider.   Take steps to manage and control your weight.   Get regular exercise.   Manage your blood pressure.   Manage other health problems, such as diabetes.    General instructions   Take over-the-counter and prescription medicines only as told by your health care provider. Blood thinners may be prescribed after your procedure to improve blood flow through the stent.   If you need an MRI after your heart stent has been placed, be sure to tell the health care provider who orders the MRI that you have a heart stent.   Keep all follow-up visits as directed by your health care provider. This is important.  Contact a health care provider if:   You have a fever.   You have chills.   You have increased bleeding from the catheter insertion area. Hold pressure on the area.  Get help right away if:   You develop chest pain or shortness of breath.   You feel faint or you pass out.   You have unusual pain at the catheter insertion area.   You have redness,  warmth, or swelling at the catheter insertion area.   You have drainage (other than a small amount of blood on the dressing) from the catheter insertion area.   The catheter insertion area is bleeding, and the bleeding does not stop after 30 minutes of holding steady pressure on the area.   You develop bleeding from any other place, such as from your rectum. There may be bright red blood in your urine or stool, or it may appear as black, tarry stool.  This information is not intended to replace advice given to you by your health care provider. Make sure you discuss any questions you have with your health care provider.  Document Released: 11/12/2004 Document Revised: 01/21/2016 Document Reviewed: 01/21/2016  [employer] Interactive Patient Education  2019 [employer]

## 2018-07-23 ENCOUNTER — Encounter: Payer: Self-pay | Admitting: *Deleted

## 2018-07-23 ENCOUNTER — Telehealth: Payer: Self-pay | Admitting: *Deleted

## 2018-07-23 ENCOUNTER — Ambulatory Visit: Payer: Medicare HMO | Admitting: *Deleted

## 2018-07-23 ENCOUNTER — Other Ambulatory Visit: Payer: Self-pay | Admitting: *Deleted

## 2018-07-23 DIAGNOSIS — N183 Chronic kidney disease, stage 3 (moderate): Secondary | ICD-10-CM

## 2018-07-23 DIAGNOSIS — I5041 Acute combined systolic (congestive) and diastolic (congestive) heart failure: Secondary | ICD-10-CM

## 2018-07-23 DIAGNOSIS — I10 Essential (primary) hypertension: Secondary | ICD-10-CM

## 2018-07-23 DIAGNOSIS — E785 Hyperlipidemia, unspecified: Secondary | ICD-10-CM

## 2018-07-23 DIAGNOSIS — I2121 ST elevation (STEMI) myocardial infarction involving left circumflex coronary artery: Secondary | ICD-10-CM

## 2018-07-23 DIAGNOSIS — N179 Acute kidney failure, unspecified: Secondary | ICD-10-CM

## 2018-07-23 DIAGNOSIS — I2510 Atherosclerotic heart disease of native coronary artery without angina pectoris: Secondary | ICD-10-CM

## 2018-07-23 NOTE — Chronic Care Management (AMB) (Addendum)
Chronic Care Management   Initial Telephone Note 07/23/2018 Name: [name] MRN: [id number] DOB: Mar 04, 1941  Referred by: Claretta Fraise, MD Reason for referral : Chronic Care Management (Initial Telehone Contact to offer services)   [name] is a 78 y.o. year old male who is a primary care patient of Stacks, Cletus Gash, MD. The CCM team was consulted for assistance with chronic disease management and care coordination needs. He was recently discharged from the hospital s/p STEMI with stent placement.   Review of patient status, including review of consultants reports, relevant laboratory and other test results, and collaboration with appropriate care team members and the patient's provider was performed as part of comprehensive patient evaluation and provision of chronic care management services.    SDOH (Social Determinants of Health) screening performed today. See Care Plan Entry related to challenges with: Financial Strain  Physical Activity   SUBJECTIVE Several episodes epistaxis that resolves on its own within a few minutes and is accompanied by a mild frontal headache. States that he was told something is wrong with his kidneys and he would like to know more about that and the small blood vessels that are clogged in his heart. He reports sipping on water now. He has not drank plain water in over 50 years. Also reports some difficulty with SOB and feeling of panic when his mind is occupied. Usually worse at night when he lays down.   OBJECTIVE BP Readings from Last 3 Encounters:  07/21/18 (!) 134/93  07/18/18 130/77  05/11/18 (!) 142/86   Lab Results  Component Value Date   CREATININE 1.31 (H) 07/21/2018   BUN 20 07/21/2018   NA 139 07/21/2018   K 3.4 (L) 07/21/2018   CL 106 07/21/2018   CO2 24 07/21/2018   Lab Results  Component Value Date   WBC 6.9 07/21/2018   HGB 13.5 07/21/2018   HCT 41.8 07/21/2018   MCV 90.1 07/21/2018   PLT 212 07/21/2018   ASSESSMENT AND  PLAN Goals Addressed            This Visit's Progress    "I need these nosebleeds to stop" (pt-stated)       Current Barriers:   Knowledge Deficits related to cause of nosebleeds and preferred treatment  Nurse Case Manager Clinical Goal(s):   Over the next 2 days, patient will verbalize understanding of plan for epistaxis management.  Over the next 5 days, patient will demonstrate improved health management independence as evidenced bya decrease in the number of epistaxis incidents and duration of bleeding.   Interventions:   Advised patient to use saline nasal spray often during the day to moisten nasal passages. Pinch the nose and apply ice when bleeding starts. Lean head over slightly, not back.   Reviewed medications with patient and discussed bleeding risk with Brilinta  Collaborated with [facility] regarding nosebleeds.  o He should contact [facility] at [phone] with any bleeding that does resolve on it's within a few minute or any heavy bleeding o He should f/u with their office if bleeding episodes of bleeding keep recurirng  Reviewed scheduled/upcoming provider appointments including: cardiology and primary care   Patient Self Care Activities:   Self administers medications as prescribed  Performs ADL's independently  Performs IADL's independently  Calls provider office for new concerns or questions  Initial goal documentation 07/23/2018 Chong Sicilian, RN       "I want him to be able to continu taking Brilinta after the  savings card runs out but we won't be able to afford it" (pt-stated)       Current Barriers:   Knowledge Deficits related to Rx assistance programs  Financial Constraints.   Nurse Case Manager Clinical Goal(s):   Over the next 30 days, patient will verbalize understanding of plan for applying for Rx assistance for Brilinta  Over the next 30 days, patient will work with CM team pharmacist to obtain Rx assistance for  Brilinta  Interventions:   Pharmacy referral for prescription assistance  Patient Self Care Activities:   Self administers medications as prescribed  Calls pharmacy for medication refills  Performs ADL's independently  Performs IADL's independently  Calls provider office for new concerns or questions  Initial goal documentation 07/23/2018 Chong Sicilian, RN       "I want to know why I feel short of breath" (pt-stated)       Current Barriers:   Knowledge Deficits related to cause of shortness of breath and effective treatment options  Nurse Case Manager Clinical Goal(s):   Over the next 7 days, patient will verbalize understanding of plan for management of shortness of breath  Over the next 30 days, patient will work with [facility] to address needs related to shortness of breath  Over the next 30 days, patient will verbalize basic understanding of heart failure, anxiety, and coronary artery  disease processes and self health management plan as evidenced by a decrease in the incidence and duration of shortness of breath  Interventions:   Advised patient to seek medical attention if he develops any chest pain, pressure, nausea, etc with SOB.  Collaborated with PCP, Dr Livia Snellen and cardiology PA regarding episodes of shortness of breath  Discussed plans with patient for ongoing care management follow up and provided patient with direct contact information for care management team   Recommended that he prop up when sleeping and resting instead of lying flat  Breath slowly in through his nose and out through pursed lips when he feels SOB  Focus on calming thoughts to combat anxiety  Referral to LCSW may be appropriate  Patient Self Care Activities:   Self administers medications as prescribed  Calls pharmacy for medication refills  Performs ADL's independently  Performs IADL's independently  Calls provider office for new concerns or questions  Initial goal  documentation  07/23/2018 Chong Sicilian, RN      "I want to understand more about kidney disease" (pt-stated)       Current Barriers:   Knowledge Deficits related to kidney disease process   Nurse Case Manager Clinical Goal(s):   Over the next 30 days, patient will verbalize understanding of plan for acute kidney disease  Over the next 30 days, patient will verbalize basic understanding of acute and chronic kidney disease process and self health management plan as evidenced by increasing water intake and normalization of BMP  Interventions:   Advised patient to increase water intake; sipping throughout the day.   Discussed plans with patient for ongoing care management follow up and provided patient with direct contact information for care management team  Patient Self Care Activities:   Self administers medications as prescribed  Calls pharmacy for medication refills  Performs ADL's independently  Performs IADL's independently  Calls provider office for new concerns or questions  Initial goal documentation 07/23/2018 [facility]       "They told me I have blockages in the little arteries in my heart and I'd like to know more about that." (pt-stated)  Current Barriers:   Knowledge Deficits related to CAD.  Nurse Case Manager Clinical Goal(s):   Over the next 30 days, patient will attend all scheduled medical appointments: with PCP and cardiology  Over the next 30 days, patient will demonstrate improved adherence to prescribed treatment plan for CAD as evidenced bytaking medication as prescribed and following up as instructed  Over the next 30 days, patient will verbalize basic understanding of coronary artery disease process and self health management plan as evidenced by restatement of plan and personal actions he has taken.  Interventions:   Evaluation of current treatment plan related to CAD and patient's adherence to plan as established by  provider.  Advised patient to keep all scheduled appointments and to seek medical care for any chest pain or pressure  Provided education to patient re: basic heart anatomy and the role of collateral circulation in longstanding CAD  Patient Self Care Activities:   Self administers medications as prescribed  Attends all scheduled provider appointments  Performs ADL's independently  Performs IADL's independently  Calls provider office for new concerns or questions  Initial goal documentation 07/23/2018 Chong Sicilian, RN         Mr. Burkhead was given information about Chronic Care Management services today including:  1. CCM service includes personalized support from designated clinical staff supervised by his physician, including individualized plan of care and coordination with other care providers 2. 24/7 contact phone numbers for assistance for urgent and routine care needs. 3. Service will only be billed when office clinical staff spend 20 minutes or more in a month to coordinate care. 4. Only one practitioner may furnish and bill the service in a calendar month. 5. The patient may stop CCM services at any time (effective at the end of the month) by phone call to the office staff. 6. The patient will be responsible for cost sharing (co-pay) of up to 20% of the service fee (after annual deductible is met).  Patient agreed to services and verbal consent obtained.    Next PCP appointment scheduled for: 07/31/2018  The CM team will reach out to the patient again over the next 5 days  Will collaborate with PCP and cardiology regarding concerns  ? Cardiac Rehab  Possible LCSW referral  Referral to [place]  The patient has been provided with contact information for the chronic care management team and has been advised to call with any health related questions or concerns.   Chong Sicilian, RN-BC, BSN Nurse Case Manager [place] [phone]

## 2018-07-23 NOTE — Patient Instructions (Addendum)
Visit Information  Goals Addressed            This Visit's Progress   . "I need these nosebleeds to stop" (pt-stated)       Current Barriers:  [name] Knowledge Deficits related to cause of nosebleeds and preferred treatment  Nurse Case Manager Clinical Goal(s):  [name] Over the next 2 days, patient will verbalize understanding of plan for epistaxis management. . Over the next 5 days, patient will demonstrate improved health management independence as evidenced bya decrease in the number of epistaxis incidents and duration of bleeding.   Interventions:  . Advised patient to use saline nasal spray often during the day to moisten nasal passages. Pinch the nose and apply ice when bleeding starts. Lean head over slightly, not back. Call [phone] if bleeding does not stop within 10 minutes or if it saturates a rag has large clots.  . Reviewed medications with patient and discussed bleeding risk with Brilinta . Collaborated with PCP, Dr Livia Snellen,  regarding epistaxis . Reviewed scheduled/upcoming provider appointments including:   Patient Self Care Activities:  . Self administers medications as prescribed . Performs ADL's independently . Performs IADL's independently . Calls provider office for new concerns or questions  Initial goal documentation 07/23/2018 Chong Sicilian, RN      . "I want him to be able to continu taking Brilinta after the savings card runs out but we won't be able to afford it" (pt-stated)       Current Barriers:  [name] Knowledge Deficits related to Rx assistance programs . Film/video editor.   Nurse Case Manager Clinical Goal(s):  [name] Over the next 30 days, patient will verbalize understanding of plan for applying for Rx assistance for Brilinta . Over the next 30 days, patient will work with CM team pharmacist to obtain Rx assistance for Brilinta  Interventions:  . Pharmacy referral for prescription assistance  Patient Self Care Activities:  . Self administers  medications as prescribed . Calls pharmacy for medication refills . Performs ADL's independently . Performs IADL's independently . Calls provider office for new concerns or questions  Initial goal documentation 07/23/2018 Chong Sicilian, RN      . "I want to know why I feel short of breath" (pt-stated)       Current Barriers:  [name] Knowledge Deficits related to cause of shortness of breath and effective treatment options  Nurse Case Manager Clinical Goal(s):  [name] Over the next 7 days, patient will verbalize understanding of plan for management of shortness of breath . Over the next 30 days, patient will work with [facility] to address needs related to shortness of breath . Over the next 30 days, patient will verbalize basic understanding of heart failure, anxiety, and coronary artery  disease processes and self health management plan as evidenced by a decrease in the incidence and duration of shortness of breath  Interventions:  . Advised patient to seek medical attention if he develops any chest pain, pressure, nausea, etc with SOB. Nash Dimmer with PCP, Dr Livia Snellen and cardiology PA regarding episodes of shortness of breath . Discussed plans with patient for ongoing care management follow up and provided patient with direct contact information for care management team  . Recommended that he prop up when sleeping and resting instead of lying flat . Breath slowly in through his nose and out through pursed lips when he feels SOB . Focus on calming thoughts to combat anxiety . Referral to LCSW may be appropriate  Patient Self Care Activities:  .  Self administers medications as prescribed . Calls pharmacy for medication refills . Performs ADL's independently . Performs IADL's independently . Calls provider office for new concerns or questions  Initial goal documentation  07/23/2018 Chong Sicilian, RN     . "I want to understand more about kidney disease" (pt-stated)       Current Barriers:   [name] Knowledge Deficits related to kidney disease process   Nurse Case Manager Clinical Goal(s):  [name] Over the next 30 days, patient will verbalize understanding of plan for acute kidney disease . Over the next 30 days, patient will verbalize basic understanding of acute and chronic kidney disease process and self health management plan as evidenced by increasing water intake and normalization of BMP  Interventions:  . Advised patient to increase water intake; sipping throughout the day.  . Discussed plans with patient for ongoing care management follow up and provided patient with direct contact information for care management team  Patient Self Care Activities:  . Self administers medications as prescribed . Calls pharmacy for medication refills . Performs ADL's independently . Performs IADL's independently . Calls provider office for new concerns or questions  Initial goal documentation 07/23/2018  ,RN      . "They told me I have blockages in the little arteries in my heart and I'd like to know more about that." (pt-stated)       Current Barriers:  [name] Knowledge Deficits related to CAD.  Nurse Case Manager Clinical Goal(s):  [name] Over the next 30 days, patient will attend all scheduled medical appointments: with PCP and cardiology . Over the next 30 days, patient will demonstrate improved adherence to prescribed treatment plan for CAD as evidenced bytaking medication as prescribed and following up as instructed . Over the next 30 days, patient will verbalize basic understanding of coronary artery disease process and self health management plan as evidenced by restatement of plan and personal actions he has taken.  Interventions:  . Evaluation of current treatment plan related to CAD and patient's adherence to plan as established by provider. . Advised patient to keep all scheduled appointments and to seek medical care for any chest pain or pressure . Provided education to  patient re: basic heart anatomy and the role of collateral circulation in longstanding CAD  Patient Self Care Activities:  . Self administers medications as prescribed . Attends all scheduled provider appointments . Performs ADL's independently . Performs IADL's independently . Calls provider office for new concerns or questions  Initial goal documentation 07/23/2018 Chong Sicilian, RN      Mr. Hottel was given information about Chronic Care Management services today including:  1. CCM service includes personalized support from designated clinical staff supervised by his physician, including individualized plan of care and coordination with other care providers 2. 24/7 contact phone numbers for assistance for urgent and routine care needs. 3. Service will only be billed when office clinical staff spend 20 minutes or more in a month to coordinate care. 4. Only one practitioner may furnish and bill the service in a calendar month. 5. The patient may stop CCM services at any time (effective at the end of the month) by phone call to the office staff. 6. The patient will be responsible for cost sharing (co-pay) of up to 20% of the service fee (after annual deductible is met).  Patient agreed to services and verbal consent obtained.     The patient verbalized understanding of instructions provided today and declined a print copy of  patient Paediatric nurse.   Plan  Next PCP appointment scheduled for: 07/31/2018  The CM team will reach out to the patient again over the next 5 days  Will collaborate with PCP and cardiology regarding concerns  ? Cardiac Rehab  Possible LCSW referral  Referral to [place]  The patient has been provided with contact information for the chronic care management team and has been advised to call with any health related questions or concerns.   Chong Sicilian, RN-BC, BSN Nurse Case Manager [place] [phone]

## 2018-07-23 NOTE — Patient Outreach (Signed)
[facility]) Care Management  07/23/2018  [name] August 09, 1940 [id number]   Telephone Screen  Referral Date: 07/23/2018 Referral Source: MD referral -[name], CM, Dr [name]  Reason for consult prescription assistance   Diagnoses of Heart Failure, Other, Kidney Failure Other Diagnosis: recent STEMI   Expected date of contact within 10 days  Per wife given Brilinta savings card that pays for two 30 day supplies even with Medicare. Will need prescription assistance after 60 days or will need cardiologist to change to more affordable medication  Insurance: [facility] admission 07/18/18 to 07/21/18 for STEMI involving left circumflex coronary artery  Outreach attempt # 1 successful outreach  Patient is able to verify HIPAA Reviewed and addressed referral to [facility] with patient [name] was assessed today by [name] Chronic critical CM at [facility]    [name] confirms when he left the hospital he was given a coupon for assistance with the cost of Brilinta and was informed the cost would be decreased 1-2 times only  He confirms at this time he would not be able to afford to get it further if the cost was increased to $500   During this call with [facility] RN CM, [name] had to be educated about HF s/s He reports he heard some of this information below but "I'm stubborn and a little hard of hearing  He reports he has cut back on his sodium since his d/c with assist of his wife  He reports he used to eat bologna, Ham potato chips, etc  Wife and her daughter has tried to find low sodium foods since his d/c  CM discussed sodium, potassium and fluids/water [name] inquired about the limits of each per day  Discussed 6-8 cups fluids unless his MD discusses a fluid restriction. Discussed 1.5 gram sodium and 4.7 grams of potassium per day unless changed by his MD  He reports he does not like to drink water He prefers juices (apple, grapefruit)  sodas   He reports a child hood trauma that causes him to not prefer water  He was encouraged to challenge himself to drink a glass of water or water with at a package of propel powder (for example) for every other cup of fluid he takes in like juice   [name] admitted he was not always taking his medications as prescribed before his recent hospitalization   He reports he has problems hearing well especially on the phone. He states his wife is always getting upset with him when she has to repeat information He states "Usually I hear her understand her. " He states this he believes is from years of working in a hosiery factor and in [employer]  He also reports that the older he gets the more he is noting he is having difficulty with spelling    Social: [name] lives at home with his wife  [name] is able to drive himself to medical appointments unless he is feeling bad and then his wife assists. His wife works 3rd shift. He is independent with his ADLs but needs assist with iADLs.     Conditions: STEMI involving left circumflex coronary artery S/p stent placement  HTN, ASCVD,Acute combined systolic and diastolic CHF, HDL, Scute renal failure superimposed on stage 3 CKD.CAD,  He reports in 2001 5 stents placed and in 2007 another stent was placed (s/p BMS to OM)   Medications: Brilinta 90 mg bid is as  was prescribed He reports the cost is now  $500  He is aware of Plavix He was on it before He reports he bled "a lot on Plavix" and does not prefer to use Plavix again He denies excess bleeding with Brilinta but reports some noted side effects from it  He reports that he is having "anxiety attacks" on Brilinta He states he loses his breath especially at night when he is attempting to sleep. He reports it is resolved by having to walk outside to "catch my breath" He states he was told that this s/s may go away "after I get use to"  He reports having trouble sleeping on Brilinta  Cardiologist is  Carlyle Dolly in [place] Has seen MD in [place] once a year He was seen last on 08/14/17 and reports he is scheduled to return for an annual visit in April 2020 He has a bottle of Brilinta 60 pills that he picked on Saturday July 21 2018   Appointments: 07/31/18 0940 primary MD hospital follow up April 2020 Dr J Leadore Directives: Denies need for assist with advance directives   Consent: [facility] RN CM reviewed [facility] services with patient. Patient gave verbal consent for services [facility] telephonic RN CM and pharmacy. He ans CM discussed possible referrals to [place] coach or community RN CM for further education prn   Plan: [facility] RN CM will refer Donald Signorelli to [place] for medication assistance and medication management for [place]. Lavina Hamman, RN, BSN, [place] Coordinator Office number [phone] Mobile number [phone]  Main [facility] number [phone] Fax number [phone]

## 2018-07-23 NOTE — Telephone Encounter (Signed)
Transitional Care Management  Telephone Note  07/23/2018  [name] is a primary care patient of Claretta Fraise, MD who was discharged from a hospital or long-term care facility over the past two business days.    An outgoing telephone call was made and I spoke with the patient or caregiver.  An appointment for Transitional Care Management is scheduled on 07/31/2018 with Dr Livia Snellen.  The patient or caregiver is knowledgeable of his/her condition(s) and treatment: Yes  Patient states that he's had several nosebleeds since discharge. He does feel like his nose is dry but he is not congested and has not been blowing his nose. No oxygen use at home but he did use nasal canula in the hospital. Bleeding starts spontaneously. Describes it a pure blood and it does not have any clots. It stops within a few minutes. He does feel a mild frontal headache with the bleeding. Which he says head pain is unusual for him.   He also complains of episodes of shortness of breath. He notices is worse at night and it is some better when sitting up vs lying down. He feels some panic with the episodes and states that it usually happens when his mind isn't occupied. If he's busy or thinking of something else, he doesn't notice it. Denies any chest pain or tightness and the SOB resolves on its own.   DISCHARGE INFORMATION Date of Discharge:07/21/2018  [facility]  Principal Discharge Diagnosis:  STEMI involving left circumflex coronary arter  Outpatient Follow Up Recommendations (from discharge summary)  Activity: Increase activity slowly as tolerated. You may shower, but no soaking baths (or swimming) for 1 week. No driving for 1 week. No lifting over 10 lbs for 2 weeks. No sexual activity for 2 weeks.    You May Return to Work: in 1 week (if applicable)   Wound Care: You may wash cath site gently with soap and water. Keep cath site clean and dry. If you notice pain, swelling, bleeding or  pus at your cath site, please call [phone].   PCP and cardiology follow ups scheduled   MEDICATION RECONCILIATION All of the medications prescribed at discharge have been picked up. Brilinta was ordered at discharge but was set to [employer]. It did not go to [employer]. Brilinta was reordered by PCP office after discharge to correct this. Wife was able to pick it up and use the copay card. Advised to keep card and use it next month as well.   A post discharge medication reconciliation was performed and the complete medication list was reviewed with the patient/caregiver and is current as of 07/23/2018.   Current Medication List Outpatient Encounter Medications as of 07/23/2018  Medication Sig  . aspirin EC 81 MG EC tablet Take 1 tablet (81 mg total) by mouth daily.  . metoprolol succinate (TOPROL-XL) 25 MG 24 hr tablet Take 1 tablet (25 mg total) by mouth daily.  . nitroGLYCERIN (NITROSTAT) 0.4 MG SL tablet Place 1 tablet (0.4 mg total) under the tongue every 5 (five) minutes as needed for chest pain.  . rosuvastatin (CRESTOR) 40 MG tablet Take 1 tablet (40 mg total) by mouth daily at 6 PM.  . ticagrelor (BRILINTA) 90 MG TABS tablet Take 1 tablet (90 mg total) by mouth 2 (two) times daily.     ACTIVITIES OF DAILY LIVING  Patient is not able to perform ADLs independently but he is weak. The primary caregiver is: self or spouse.  Patient is receiving  home health services: No. Believes he is scheduled for heart PT.  PATIENT EDUCATION . Take all medications as prescribed . Contact our office sooner than your scheduled appointment if you have any questions or concerns . Verbal education on kidney disease, collateral circulation, CAD,  . Nosebleed treatment o Use saline nasal spray throughout the day to moisten nasal passages o Hold pressure and apply ice if necessary o If bleeding does not stop within 10 minutes or if it saturates a rag call our office at [phone] or [phone] if the  bleeding is severe . Prop up when lying down instead of laying flat   PLAN  Talk with PCP regarding nosebleeds and shortness of breath, anxiety (?)  CCM for care coordination  Chong Sicilian, RN-BC, BSN Nurse Case Manager [place] Ph: [phone]

## 2018-07-24 ENCOUNTER — Ambulatory Visit (INDEPENDENT_AMBULATORY_CARE_PROVIDER_SITE_OTHER): Payer: Medicare HMO | Admitting: *Deleted

## 2018-07-24 ENCOUNTER — Other Ambulatory Visit: Payer: Self-pay | Admitting: *Deleted

## 2018-07-24 ENCOUNTER — Telehealth: Payer: Self-pay | Admitting: Cardiology

## 2018-07-24 DIAGNOSIS — I5041 Acute combined systolic (congestive) and diastolic (congestive) heart failure: Secondary | ICD-10-CM | POA: Diagnosis not present

## 2018-07-24 DIAGNOSIS — I2121 ST elevation (STEMI) myocardial infarction involving left circumflex coronary artery: Secondary | ICD-10-CM

## 2018-07-24 DIAGNOSIS — I10 Essential (primary) hypertension: Secondary | ICD-10-CM

## 2018-07-24 NOTE — Chronic Care Management (AMB) (Signed)
Chronic Care Management   Follow Up Note   07/24/2018 Name: LOVELACE CERVENY MRN: [id number] DOB: Jul 11, 1940  Referred by: Claretta Fraise, MD Reason for referral : Chronic Care Management (RNCM follow up telephone call)   AC COLAN is a 78 y.o. year old male who is a primary care patient of Stacks, Cletus Gash, MD. The CCM team was consulted for assistance with chronic disease management and care coordination needs.    Review of patient status, including review of consultants reports, relevant laboratory and other test results, and collaboration with appropriate care team members and the patient's provider was performed as part of comprehensive patient evaluation and provision of chronic care management services.    Goals Addressed    . "I want to know more about my heart failure" (pt-stated)       Current Barriers:  [name] Knowledge Deficits related to heart failure and self management strategies  Nurse Case Manager Clinical Goal(s):  [name] Over the next 7 days, patient will verbalize understanding of plan for heart failure management  Interventions:  . Collaborated with Maudie Mercury, Nurse Case Manager with [facility]  regarding Care Guide referral . Discussed plans with patient for ongoing care management follow up and provided patient with direct contact information for care management team . Reviewed scheduled/upcoming provider appointments including:  . Care Guide referral for heart failure. Kim with [facility] will place this referral.   . Collaborated with Dr Nelly Laurence nurse at [facility] in [place]. o She will talk with Dr Harl Bowie and contact patient regarding SOB  Patient Self Care Activities:  . Self administers medications as prescribed . Calls pharmacy for medication refills . Performs ADL's independently . Performs IADL's independently . Calls provider office for new concerns or questions    . "I want to know why I feel short of breath" (pt-stated)       Current Barriers:  [name] Knowledge Deficits  related to cause of shortness of breath and effective treatment options  Nurse Case Manager Clinical Goal(s):  [name] Over the next 7 days, patient will verbalize understanding of plan for management of shortness of breath . Over the next 30 days, patient will work with [facility] to address needs related to shortness of breath . Over the next 30 days, patient will verbalize basic understanding of heart failure, anxiety, and coronary artery  disease processes and self health management plan as evidenced by a decrease in the incidence and duration of shortness of breath  Interventions:  . Advised patient to seek medical attention if he develops any chest pain, pressure, nausea, etc with SOB. Nash Dimmer with PCP, Dr Livia Snellen o Recommended f/u with cardiologist within the next week . Placed call to [facility] and left message for Dr Nelly Laurence nurse to return my call . Discussed plans with patient for ongoing care management follow up and provided patient with direct contact information for care management team  . Recommended that he prop up when sleeping and resting instead of lying flat . Breath slowly in through his nose and out through pursed lips when he feels SOB . Focus on calming thoughts to combat anxiety . Referral to LCSW may be appropriate  Patient Self Care Activities:  . Self administers medications as prescribed . Calls pharmacy for medication refills . Performs ADL's independently . Performs IADL's independently . Calls provider office for new concerns or questions       Plan The CM team will reach out to the patient again over the next  7 days.    Chong Sicilian, RN-BC, BSN Nurse Case Manager [place] [phone]

## 2018-07-24 NOTE — Patient Instructions (Signed)
Visit Information  Goals Addressed            This Visit's Progress   . "I want to know more about my heart failure" (pt-stated)       Current Barriers:  [name] Knowledge Deficits related to heart failure and self management strategies  Nurse Case Manager Clinical Goal(s):  [name] Over the next 7 days, patient will verbalize understanding of plan for heart failure management  Interventions:  . Collaborated with Maudie Mercury, Nurse Case Manager with [facility]  regarding Care Guide referral . Discussed plans with patient for ongoing care management follow up and provided patient with direct contact information for care management team . Reviewed scheduled/upcoming provider appointments including:  . Care Guide referral for heart failure. Kim with [facility] will place this referral.   . Collaborated with Dr Nelly Laurence nurse at [facility] in [place]. o She will talk with Dr Harl Bowie and contact patient regarding SOB  Patient Self Care Activities:  . Self administers medications as prescribed . Calls pharmacy for medication refills . Performs ADL's independently . Performs IADL's independently . Calls provider office for new concerns or questions  Initial goal documentation 07/24/2018 Chong Sicilian, RN Case Manager      . "I want to know why I feel short of breath" (pt-stated)       Current Barriers:  [name] Knowledge Deficits related to cause of shortness of breath and effective treatment options  Nurse Case Manager Clinical Goal(s):  [name] Over the next 7 days, patient will verbalize understanding of plan for management of shortness of breath . Over the next 30 days, patient will work with [facility] to address needs related to shortness of breath . Over the next 30 days, patient will verbalize basic understanding of heart failure, anxiety, and coronary artery  disease processes and self health management plan as evidenced by a decrease in the incidence and duration of shortness of breath  Interventions:  . Advised patient to  seek medical attention if he develops any chest pain, pressure, nausea, etc with SOB. Nash Dimmer with PCP, Dr Livia Snellen o Recommended f/u with cardiologist within the next week . Placed call to [facility] and left message for Dr Nelly Laurence nurse to return my call . Discussed plans with patient for ongoing care management follow up and provided patient with direct contact information for care management team  . Recommended that he prop up when sleeping and resting instead of lying flat . Breath slowly in through his nose and out through pursed lips when he feels SOB . Focus on calming thoughts to combat anxiety . Referral to LCSW may be appropriate  Patient Self Care Activities:  . Self administers medications as prescribed . Calls pharmacy for medication refills . Performs ADL's independently . Performs IADL's independently . Calls provider office for new concerns or questions  Please see past updates related to this goal by clicking on the "Past Updates" button in the selected goal   07/24/2018 Chong Sicilian, RN        The patient verbalized understanding of instructions provided today and declined a print copy of patient instruction materials.    The CM team will reach out to the patient again over the next 7 days.   Chong Sicilian, RN-BC, BSN Nurse Case Manager [place] [phone]

## 2018-07-24 NOTE — Patient Outreach (Signed)
[place] [facility]) Care Management  07/24/2018  HALO LASKI 02-21-1941 [id number]   Care coordination    [facility] health coach referral completed for more education on CAD and CHF after further collaboration with chronic embedded RN CM, [place]. Lavina Hamman, RN, BSN, [place] Coordinator Office number [phone] Mobile number [phone]  Main [facility] number [phone] Fax number [phone]

## 2018-07-24 NOTE — Patient Outreach (Signed)
[place] [facility]) Care Management  07/24/2018  [name] August 09, 1940 [id number]   Care coordination  [facility] RN CM spoke with [name], Chronic embedded CM about pt telephonic assessment conclusion, interventions completed on the call, shared information that may be needed to continue to assist [name] and possible available resources for [name]    Plan Continue to collaborate with [name] and provider other resources prn   Joelene Millin L. Lavina Hamman, RN, BSN, [place] Coordinator Office number [phone] Mobile number [phone]  Main [facility] number [phone] Fax number [phone]

## 2018-07-24 NOTE — Telephone Encounter (Signed)
Cyril Mourning called with [facility] stating Doctor is asking for patient to be seen this week by Dr. Harl Bowie  [phone]

## 2018-07-24 NOTE — Addendum Note (Signed)
Addended by: Barbaraann Faster on: 07/24/2018 06:46 PM   Modules accepted: Orders

## 2018-07-24 NOTE — Telephone Encounter (Signed)
Spoke with [name] who says pt c/o SOB and wanted [place] to f/u with pt - called pt who c/o SOB mostly when he lays down at night that has been happening the last 3 days - has been sitting up in the recliner to sleep - says SOB can last over an hour - denies any Chest pain/dizziness/swelling - doesn't monitor HR/BP at home - says he does think he is anxious since d/c from hospital - has f/u in April with extender and wanted to know if he needed to be seen sooner

## 2018-07-25 NOTE — Telephone Encounter (Signed)
Pt will come tomorrow at 840 am

## 2018-07-25 NOTE — Telephone Encounter (Signed)
Can put him in a slot tomorrow that should be opening up as we clear our routine follow up slots    Zandra Abts MD

## 2018-07-26 ENCOUNTER — Other Ambulatory Visit: Payer: Self-pay | Admitting: *Deleted

## 2018-07-26 ENCOUNTER — Telehealth: Payer: Self-pay | Admitting: Cardiology

## 2018-07-26 ENCOUNTER — Ambulatory Visit: Payer: Medicare HMO | Admitting: Cardiology

## 2018-07-26 ENCOUNTER — Encounter: Payer: Self-pay | Admitting: Cardiology

## 2018-07-26 ENCOUNTER — Other Ambulatory Visit: Payer: Self-pay

## 2018-07-26 VITALS — BP 109/75 | HR 60 | Temp 97.7°F | Ht 74.0 in | Wt 207.2 lb

## 2018-07-26 DIAGNOSIS — R0602 Shortness of breath: Secondary | ICD-10-CM

## 2018-07-26 DIAGNOSIS — I2510 Atherosclerotic heart disease of native coronary artery without angina pectoris: Secondary | ICD-10-CM

## 2018-07-26 MED ORDER — CLOPIDOGREL BISULFATE 75 MG PO TABS: ORAL_TABLET | ORAL | 1 refills | Status: DC

## 2018-07-26 MED ORDER — CLOPIDOGREL BISULFATE 75 MG PO TABS: ORAL_TABLET | ORAL | 3 refills | Status: DC

## 2018-07-26 NOTE — Patient Instructions (Signed)
Your physician recommends that you schedule a follow-up appointment in: [place] has recommended you make the following change in your medication:   TAKE LAST DOSE OF BRLINITA TONIGHT - THEN STOP  START PLAVIX 300 MG (4 TABLETS) TOMORROW THEN TAKE 75 MG (1 TABLET) DAILY   PLEASE KEEP SODIUM INTAKE LESS THAN 2000 MG DAILY    Heart-Healthy Eating Plan Heart-healthy meal planning includes:  Eating less unhealthy fats.  Eating more healthy fats.  Making other changes in your diet. Talk with your doctor or a diet specialist (dietitian) to create an eating plan that is right for you. What is my plan? Your doctor may recommend an eating plan that includes:  Total fat: ______% or less of total calories a day.  Saturated fat: ______% or less of total calories a day.  Cholesterol: less than _________mg a day. What are tips for following this plan? Cooking Avoid frying your food. Try to bake, boil, grill, or broil it instead. You can also reduce fat by:  Removing the skin from poultry.  Removing all visible fats from meats.  Steaming vegetables in water or broth. Meal planning   At meals, divide your plate into four equal parts: ? Fill one-half of your plate with vegetables and green salads. ? Fill one-fourth of your plate with whole grains. ? Fill one-fourth of your plate with lean protein foods.  Eat 4-5 servings of vegetables per day. A serving of vegetables is: ? 1 cup of raw or cooked vegetables. ? 2 cups of raw leafy greens.  Eat 4-5 servings of fruit per day. A serving of fruit is: ? 1 medium whole fruit. ?  cup of dried fruit. ?  cup of fresh, frozen, or canned fruit. ?  cup of 100% fruit juice.  Eat more foods that have soluble fiber. These are apples, broccoli, carrots, beans, peas, and barley. Try to get 20-30 g of fiber per day.  Eat 4-5 servings of nuts, legumes, and seeds per week: ? 1 serving of dried beans or legumes  equals  cup after being cooked. ? 1 serving of nuts is  cup. ? 1 serving of seeds equals 1 tablespoon. General information  Eat more home-cooked food. Eat less restaurant, buffet, and fast food.  Limit or avoid alcohol.  Limit foods that are high in starch and sugar.  Avoid fried foods.  Lose weight if you are overweight.  Keep track of how much salt (sodium) you eat. This is important if you have high blood pressure. Ask your doctor to tell you more about this.  Try to add vegetarian meals each week. Fats  Choose healthy fats. These include olive oil and canola oil, flaxseeds, walnuts, almonds, and seeds.  Eat more omega-3 fats. These include salmon, mackerel, sardines, tuna, flaxseed oil, and ground flaxseeds. Try to eat fish at least 2 times each week.  Check food labels. Avoid foods with trans fats or high amounts of saturated fat.  Limit saturated fats. ? These are often found in animal products, such as meats, butter, and cream. ? These are also found in plant foods, such as palm oil, palm kernel oil, and coconut oil.  Avoid foods with partially hydrogenated oils in them. These have trans fats. Examples are stick margarine, some tub margarines, cookies, crackers, and other baked goods. What foods can I eat? Fruits All fresh, canned (in natural juice), or frozen fruits. Vegetables Fresh or frozen vegetables (raw, steamed, roasted, or grilled).  Green salads. Grains Most grains. Choose whole wheat and whole grains most of the time. Rice and pasta, including brown rice and pastas made with whole wheat. Meats and other proteins Lean, well-trimmed beef, veal, pork, and lamb. Chicken and [place] without skin. All fish and shellfish. Wild duck, rabbit, pheasant, and venison. Egg whites or low-cholesterol egg substitutes. Dried beans, peas, lentils, and tofu. Seeds and most nuts. Dairy Low-fat or nonfat cheeses, including ricotta and mozzarella. Skim or 1% milk that is liquid,  powdered, or evaporated. Buttermilk that is made with low-fat milk. Nonfat or low-fat yogurt. Fats and oils Non-hydrogenated (trans-free) margarines. Vegetable oils, including soybean, sesame, sunflower, olive, peanut, safflower, corn, canola, and cottonseed. Salad dressings or mayonnaise made with a vegetable oil. Beverages Mineral water. Coffee and tea. Diet carbonated beverages. Sweets and desserts Sherbet, gelatin, and fruit ice. Small amounts of dark chocolate. Limit all sweets and desserts. Seasonings and condiments All seasonings and condiments. The items listed above may not be a complete list of foods and drinks you can eat. Contact a dietitian for more options. What foods should I avoid? Fruits Canned fruit in heavy syrup. Fruit in cream or butter sauce. Fried fruit. Limit coconut. Vegetables Vegetables cooked in cheese, cream, or butter sauce. Fried vegetables. Grains Breads that are made with saturated or trans fats, oils, or whole milk. Croissants. Sweet rolls. Donuts. High-fat crackers, such as cheese crackers. Meats and other proteins Fatty meats, such as hot dogs, ribs, sausage, bacon, rib-eye roast or steak. High-fat deli meats, such as salami and bologna. Caviar. Domestic duck and goose. Organ meats, such as liver. Dairy Cream, sour cream, cream cheese, and creamed cottage cheese. Whole-milk cheeses. Whole or 2% milk that is liquid, evaporated, or condensed. Whole buttermilk. Cream sauce or high-fat cheese sauce. Yogurt that is made from whole milk. Fats and oils Meat fat, or shortening. Cocoa butter, hydrogenated oils, palm oil, coconut oil, palm kernel oil. Solid fats and shortenings, including bacon fat, salt pork, lard, and butter. Nondairy cream substitutes. Salad dressings with cheese or sour cream. Beverages Regular sodas and juice drinks with added sugar. Sweets and desserts Frosting. Pudding. Cookies. Cakes. Pies. Milk chocolate or white chocolate. Buttered  syrups. Full-fat ice cream or ice cream drinks. The items listed above may not be a complete list of foods and drinks to avoid. Contact a dietitian for more information. Summary  Heart-healthy meal planning includes eating less unhealthy fats, eating more healthy fats, and making other changes in your diet.  Eat a balanced diet. This includes fruits and vegetables, low-fat or nonfat dairy, lean protein, nuts and legumes, whole grains, and heart-healthy oils and fats. This information is not intended to replace advice given to you by your health care provider. Make sure you discuss any questions you have with your health care provider. Document Released: 10/25/2011 Document Revised: 06/02/2017 Document Reviewed: 06/02/2017 Elsevier Interactive Patient Education  2019 [employer].  Thank you for choosing [facility]!!

## 2018-07-26 NOTE — Telephone Encounter (Signed)
Spoke with pt and insurance will not cover the 94 tablets we sent in (pt is to take 4 tablets of plavix 75 mg on day 1 (300 mg) pt aware that we will send in another rx with 90 tablets so that insurance will cover this and pt is aware to take 4 tablets tomorrow after last dose of Brilinta tonight - Medication sent to pharmacy.

## 2018-07-26 NOTE — Telephone Encounter (Signed)
Patient is at the pharmacy stating that his insurance will not pay for some of his pills.

## 2018-07-26 NOTE — Progress Notes (Signed)
Clinical Summary [name] is a 78 y.o.male  seen for follow up of the following medical problems. This is a focused visit on his history of CAD and recent STEMI  1. CAD - multiple PCIs in the past - hx of MI in 2004, with stenting to RCA (ostium down to PDA). Of note had GI bleed 2 months later on DAPT and NSAIDs requiring transfusion.  - cath from 2007 with occluded LCX OM, received a BMS. RCA stents were patent at that time. LVEF 70% by LV gram at that time, no echoes in our system   - admit 07/2018 with acute posterior STEMI, taken to cath lab - 07/2018 cath showed ostal LAD 60%, mid LAD 99%, D1 95%, OM1 occluded, RCA 30%, mid RCA 95%. Received DES to OM1, then staged PCI to RCA. From notes no options on treating LAD, diffusely diseased and no target for CABG. Recs for indefinitie DAPT. LVEDP 29, limited diuresis due to uptrend in Cr.  - isoalted episode of chest pain last night. Sharp pain left sided, just a few seconds. - SOB/DOE can occur at rest or activity, suddenly comes on him. Feels anxious, lasts 5-10 minutes. Can occur with laying down at night, not better with sitting up.     Past Medical History:  Diagnosis Date  . Bradycardia   . CAD in native artery   . Foot drop, right foot   . History of GI bleed   . History of mitral valve prolapse   . History of peptic ulcer disease   . Hyperlipidemia, mixed   . Hypertension   . MI (myocardial infarction) ([place]) 2000, 2004  . Neuromuscular disorder (HCC)      Allergies  Allergen Reactions  . Ibuprofen Nausea Only     Current Outpatient Medications  Medication Sig Dispense Refill  . aspirin EC 81 MG EC tablet Take 1 tablet (81 mg total) by mouth daily.    . metoprolol succinate (TOPROL-XL) 25 MG 24 hr tablet Take 1 tablet (25 mg total) by mouth daily. 90 tablet 3  . nitroGLYCERIN (NITROSTAT) 0.4 MG SL tablet Place 1 tablet (0.4 mg total) under the tongue every 5 (five) minutes as needed for chest pain. 25 tablet  3  . rosuvastatin (CRESTOR) 40 MG tablet Take 1 tablet (40 mg total) by mouth daily at 6 PM. 90 tablet 3  . ticagrelor (BRILINTA) 90 MG TABS tablet Take 1 tablet (90 mg total) by mouth 2 (two) times daily. 60 tablet 11   No current facility-administered medications for this visit.      Past Surgical History:  Procedure Laterality Date  . CORONARY STENT INTERVENTION N/A 07/18/2018   Procedure: CORONARY STENT INTERVENTION;  Surgeon: Martinique, Peter M, MD;  Location: [facility];  Service: Cardiovascular;  Laterality: N/A;  . CORONARY STENT INTERVENTION N/A 07/20/2018   Procedure: CORONARY STENT INTERVENTION;  Surgeon: Martinique, Peter M, MD;  Location: [facility];  Service: Cardiovascular;  Laterality: N/A;  . CORONARY STENT PLACEMENT  2004, 2007   5 in 2004, 1 in 2007  . CORONARY/GRAFT ACUTE MI REVASCULARIZATION N/A 07/18/2018   Procedure: Coronary/Graft Acute MI Revascularization;  Surgeon: Martinique, Peter M, MD;  Location: [facility];  Service: Cardiovascular;  Laterality: N/A;  . LEFT HEART CATH AND CORONARY ANGIOGRAPHY N/A 07/18/2018   Procedure: LEFT HEART CATH AND CORONARY ANGIOGRAPHY;  Surgeon: Martinique, Peter M, MD;  Location: [facility];  Service: Cardiovascular;  Laterality:  N/A;     Allergies  Allergen Reactions  . Ibuprofen Nausea Only      Family History  Problem Relation Age of Onset  . Heart attack Mother   . Heart attack Father   . Coronary artery disease Other   . Cancer Daughter      Social History [name] reports that he quit smoking about 42 years ago. He has never used smokeless tobacco. [name] reports no history of alcohol use.   Review of Systems CONSTITUTIONAL: No weight loss, fever, chills, weakness or fatigue.  HEENT: Eyes: No visual loss, blurred vision, double vision or yellow sclerae.No hearing loss, sneezing, congestion, runny nose or sore throat.  SKIN: No rash or itching.  CARDIOVASCULAR: per hpi RESPIRATORY: per  hpi GASTROINTESTINAL: No anorexia, nausea, vomiting or diarrhea. No abdominal pain or blood.  GENITOURINARY: No burning on urination, no polyuria NEUROLOGICAL: No headache, dizziness, syncope, paralysis, ataxia, numbness or tingling in the extremities. No change in bowel or bladder control.  MUSCULOSKELETAL: No muscle, back pain, joint pain or stiffness.  LYMPHATICS: No enlarged nodes. No history of splenectomy.  PSYCHIATRIC: No history of depression or anxiety.  ENDOCRINOLOGIC: No reports of sweating, cold or heat intolerance. No polyuria or polydipsia.  [name]   Physical Examination Today's Vitals   07/26/18 0828  BP: 109/75  Pulse: 60  Temp: 97.7 F (36.5 C)  TempSrc: Temporal  SpO2: 98%  Weight: 207 lb 3.2 oz (94 kg)  Height: 6\' 2"  (1.88 m)   Body mass index is 26.6 kg/m.  Gen: resting comfortably, no acute distress HEENT: no scleral icterus, pupils equal round and reactive, no palptable cervical adenopathy,  CV: RRR, no m/r/g, no jvd Resp: Clear to auscultation bilaterally GI: abdomen is soft, non-tender, non-distended, normal bowel sounds, no hepatosplenomegaly MSK: extremities are warm, no edema.  Skin: warm, no rash Neuro:  no focal deficits Psych: appropriate affect   Diagnostic Studies Cath 2007 Central aortic pressure is 101/68 with a mean of 84. LV pressure is 94/1  with an EDP of 6. There is no aortic stenosis.  Left main was normal.  LAD was a large vessel coursing to the apex. There was very prominent  severe ectasia in the proximal portion with aneurysmal dilatation and  swirling flow. However, no high grade stenosis. In the mid LAD, there was  a diffuse 40-50% segmental plaquing. In the distal LAD, there was a tubular  70-80% stenosis. There also were two diagonals. The first diagonal is a  medium vessel with a long 90% stenosis. The second diagonal was small with  branching vessel with a 70-80% proximal lesion.  The left circumflex was a  moderate sized system. It gave off a large  branching OM-1 and a small OM-2. There is a 40% proximal lesion in the  circumflex. The lower Khayman Kirsch of the OM was totally occluded and the distal  part of the vessel filled through collaterals with TIMI I flow. There is a  40% blockage in the upper portion of the OM.  The right coronary artery was a large dominant vessel. It gave off a  moderate sized PDA and two small PLs. There was evidence of previously  placed stent from the ostial portion down to the PDA. There was 40% ostial  stenosis with damping of the 6-French catheter. This did not change with  sublingual nitroglycerin. There is a 30% lesion in the proximal to mid  portion.  Left ventriculogram done the RAO position showed an EF  of 70% with mild  mitral valve prolapse. There was no wall motion abnormalities and there was  no mitral regurgitation. Left ventriculogram done in the LAO position  showed an EF of 70% with no regional wall motion abnormalities.  Abdominal aortogram showed patent renal arteries bilaterally with no  abdominal aortic aneurysm.  A left subclavian done for possible bypass surgery showed a patent LIMA to  the chest wall with no obstructions in the subclavian.  ASSESSMENT:  1. Three vessel native coronary artery disease.  2. The LAD has diffuse ectasia and swelling flow proximally as described  above. There is a borderline lesion in the distal LAD and a high grade  disease in the diagonal, but these do not appear to be the culprit  lesion.  3. Totally occluded OM1 which appears to be the culprit.  4. Patent right coronary stents.  5. Normal LV function with mild mitral valve prolapse but no significant  mitral regurgitation.  CONCLUSION: Successful PCI of a totally occluded circumflex marginal vessel  with improvement in narrowing from 100% to 0% and improvement in flow from  TIMI 0 to TIMI III flow using a bare metal stent.    07/2018 echo IMPRESSIONS    1. The left ventricle has low normal systolic function, with an ejection fraction of 50-55%. The cavity size was normal. Left ventricular diastolic Doppler parameters are indeterminate.  2. Mild hypokinesis of the left ventricular, mid-apical inferoseptal wall, inferior segment and inferolateral wall.  3. The right ventricle has normal systolic function. The cavity was normal. There is no increase in right ventricular wall thickness.  4. The aortic valve is tricuspid Mild thickening of the aortic valve Mild calcification of the aortic valve. Aortic valve regurgitation was not assessed by color flow Doppler.   07/2018 cath  Previously placed Mid RCA to Dist RCA stent (unknown type) is widely patent.  Ost RCA to Mid RCA lesion is 30% stenosed.  Mid RCA lesion is 95% stenosed.  Ost 1st Mrg to 1st Mrg lesion is 100% stenosed.  Ost LAD to Prox LAD lesion is 60% stenosed.  Prox LAD lesion is 25% stenosed.  Ost 1st Diag to 1st Diag lesion is 95% stenosed.  Mid LAD to Dist LAD lesion is 99% stenosed.  Post intervention, there is a 0% residual stenosis.  A drug-eluting stent was successfully placed using a STENT SYNERGY DES 3X24.  LV end diastolic pressure is moderately elevated.   1. 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 Diffuse 99% stenosis in the mid to distal LAD. 95% bifurcating first diagonal.    - 100% occlusion of the large first OM at site of prior stent. This is the culprit lesion   - 95% mid RCA in stent 2. Moderately elevated LVEDP 3. Successful PCI of the OM 1 with DES x1  Discussed with Dr. Gwenlyn Found. There are no options for treating the LAD. It is diffusely diseased and there is no target for CABG. There are some right to left collaterals to the distal LAD. We proceeded with emergent PCI of the OM1. Recommend DAPT indefinitely given multiple layers of stent. Plan to bring patient back in 2 days for  PCI of the RCA. Will treat the LAD medically. Check LV function by Echo. Maximize medical therapy. Stress importance of medication compliance.    07/2018 staged PCI  Mid RCA lesion is 95% stenosed.  A drug-eluting stent was successfully placed using a STENT SYNERGY  DES 3.5X16.  Post intervention, there is a 0% residual stenosis.   1. Successful PCI of the mid RCA with DES x 1.  Plan: continue DAPT indefinitely. Anticipate DC home in am.  Assessment and Plan  1. CAD/SOB -recent STEMI and PCI as reported - recent episodes of SOB likely related to brillinta. We will d/c brillinta, load with plavix 300mg  x 1 day then 75mg  daily - if ongoing SOB after med switch, he did have an elevated LVEDP during cath, consider diuresis.  - EKG today shows SR, nonspecific ST/T changes chronic  F/u 6-8 weeks    Arnoldo Lenis, M.D

## 2018-07-30 ENCOUNTER — Other Ambulatory Visit: Payer: Self-pay | Admitting: *Deleted

## 2018-07-31 ENCOUNTER — Other Ambulatory Visit: Payer: Self-pay

## 2018-07-31 ENCOUNTER — Telehealth (INDEPENDENT_AMBULATORY_CARE_PROVIDER_SITE_OTHER): Payer: Medicare HMO | Admitting: Family Medicine

## 2018-07-31 DIAGNOSIS — I5041 Acute combined systolic (congestive) and diastolic (congestive) heart failure: Secondary | ICD-10-CM | POA: Diagnosis not present

## 2018-07-31 DIAGNOSIS — I10 Essential (primary) hypertension: Secondary | ICD-10-CM | POA: Diagnosis not present

## 2018-07-31 DIAGNOSIS — I2121 ST elevation (STEMI) myocardial infarction involving left circumflex coronary artery: Secondary | ICD-10-CM | POA: Diagnosis not present

## 2018-07-31 DIAGNOSIS — I2510 Atherosclerotic heart disease of native coronary artery without angina pectoris: Secondary | ICD-10-CM | POA: Diagnosis not present

## 2018-07-31 DIAGNOSIS — E785 Hyperlipidemia, unspecified: Secondary | ICD-10-CM | POA: Diagnosis not present

## 2018-07-31 NOTE — Progress Notes (Signed)
   Virtual Visit via telephone Note  I connected with [name] on 07/31/18 at 9:43 by telephone and verified that I am speaking with the correct person using two identifiers. [name] is currently located at home and wife is currently with her during visit. The provider, Claretta Fraise, MD is located in his office at time of visit.  I discussed the limitations, risks, security and privacy concerns of performing an evaluation and management service by telephone and the availability of in person appointments. I also discussed with the patient that there may be a patient responsible charge related to this service. The patient expressed understanding and agreed to proceed.  History of Present Illness  Had MI  10 days ago.  Feels weak all over. Didn't know he had high blood pressure. Now says he used to have dizziness, but that has resolved. Avoiding salt. Used to use a lot. At this time he says that the dyspnea he was having just after the MI is improving. Currrently he is getting around okay but also getting a lot of rest. He denies leg edema. No chest pains since DC, but is aware to use Nitroglycerin and call if it occurs.     Assessment and Plan: 1. ASCVD (arteriosclerotic cardiovascular disease)   2. STEMI involving left circumflex coronary artery ([place])   3. Essential hypertension, benign   4. Hyperlipidemia with target LDL less than 70   5. Acute combined systolic and diastolic heart failure ([place])     Each problem seems stable by phone evaluation.   Follow Up Instructions: Continue current medications. Monitor for swelling in legs, feet, ankles. Dyspnea should continue to improve. If it worsens, call, or if sever, call 911. Use nitroglycerin for chest pain. Call 911 if it doesn't clear promptly. Strength may take several weeks to get back to normal. It should be improving little by little for several weeks.    I discussed the assessment and treatment plan with the patient. The  patient was provided an opportunity to ask questions and all were answered. The patient agreed with the plan and demonstrated an understanding of the instructions.   The patient was advised to call back or seek an in-person evaluation if the symptoms worsen or if the condition fails to improve as anticipated.  The above assessment and management plan was discussed with the patient. The patient verbalized understanding of and has agreed to the management plan. Patient is aware to call the clinic if symptoms persist or worsen. Patient is aware when to return to the clinic for a follow-up visit. Patient educated on when it is appropriate to go to the emergency department.    I provided 12 minutes of non-face-to-face time during this encounter. Visit ended at 9:55.    Claretta Fraise, MD

## 2018-08-01 ENCOUNTER — Ambulatory Visit: Payer: Self-pay | Admitting: Pharmacist

## 2018-08-01 ENCOUNTER — Other Ambulatory Visit: Payer: Self-pay | Admitting: Pharmacist

## 2018-08-01 NOTE — Patient Outreach (Addendum)
[name]) Care Management  [name]   08/01/2018  [name] Aug 27, 1940 [id number]  Reason for referral: Medication Assistance  Referral source: [facility] RN Current insurance: [employer]  PMHx includes but not limited to:  HTN, ACS, HLD  Outreach:  Successful telephone call with [name].  HIPAA identifiers verified.  Patient states he is doing "okay" today.  He is agreeable to review medications telephonically.  He was previously taking Brilinta for ACS/STEMI along with ASA 81mg .  Patient states this was recently changed to Plavix/ASA 81mg  due to SOB from [place].  Patient states he is still experiencing SOB, however it has improved since Brilinta has been discontinued.  Instructed patient to call his MD if symptoms persist of worsen.  Encouraged patient to stay at home due to COVID-19.  Patient verbalized understanding.  No further pharmacy needs identified.  Objective: Lab Results  Component Value Date   CREATININE 1.31 (H) 07/21/2018   CREATININE 1.48 (H) 07/20/2018   CREATININE 1.47 (H) 07/19/2018    No results found for: HGBA1C  Lipid Panel     Component Value Date/Time   CHOL 280 (H) 02/12/2018 1303   TRIG 128 02/12/2018 1303   TRIG 119 06/13/2014 1229   TRIG 233 (HH) 05/17/2006 0831   HDL 47 02/12/2018 1303   HDL 47 06/13/2014 1229   CHOLHDL 6.0 (H) 02/12/2018 1303   CHOLHDL 4.9 10/11/2010 0828   VLDL 33 10/11/2010 0828   LDLCALC 207 (H) 02/12/2018 1303   LDLDIRECT 142.8 01/28/2008 0848    BP Readings from Last 3 Encounters:  07/26/18 109/75  07/21/18 (!) 134/93  07/18/18 130/77    Allergies  Allergen Reactions  . Ibuprofen Nausea Only    Medications Reviewed Today    Reviewed by Lavera Guise, [facility] (Pharmacist) on 08/01/18 at [place] List Status: <None>  Medication Order Taking? Sig Documenting Provider Last Dose Status Informant  aspirin EC 81 MG EC tablet [id number] No Take 1 tablet (81 mg total) by mouth daily. Erma Heritage, PA-C Taking Active   clopidogrel (PLAVIX) 75 MG tablet [id number] No Take 1 tablet Arnoldo Lenis, MD Taking Active   metoprolol succinate (TOPROL-XL) 25 MG 24 hr tablet [id number] No Take 1 tablet (25 mg total) by mouth daily. Erma Heritage, PA-C Taking Active   nitroGLYCERIN (NITROSTAT) 0.4 MG SL tablet [id number] No Place 1 tablet (0.4 mg total) under the tongue every 5 (five) minutes as needed for chest pain. Erma Heritage, PA-C Taking Active   rosuvastatin (CRESTOR) 40 MG tablet [id number] No Take 1 tablet (40 mg total) by mouth daily at 6 PM. Ahmed Prima, Fransisco Hertz, PA-C Taking Active          Assessment:  Drugs sorted by system:  Cardiovascular: ASA, Plavix, metoprolol, rosuvastatin, nitrates PRN  Medication Review Findings:  . Brilinta to Plavix transition complete . Encouraged patient to continue taking statin as prescribed. LDL elevated.   Plan: Allergy list updated to include "Brilinta-SOB" Will close [facility] pharmacy case as no further medication needs identified at this time.  Am happy to assist in the future as needed.     Regina Eck, PharmD, [place]  [phone]

## 2018-08-02 ENCOUNTER — Ambulatory Visit: Payer: Medicare HMO | Admitting: Pharmacist

## 2018-08-08 ENCOUNTER — Ambulatory Visit: Payer: Medicare HMO | Admitting: Student

## 2018-08-14 ENCOUNTER — Ambulatory Visit: Payer: Medicare HMO | Admitting: Family Medicine

## 2018-08-16 ENCOUNTER — Other Ambulatory Visit: Payer: Self-pay | Admitting: *Deleted

## 2018-08-16 NOTE — Patient Outreach (Signed)
[facility]) Care Management  08/16/2018  [name] 05-26-1940 [id number]   RN Health Coach Initial Assessment  Referral Date:  07/25/2018 Referral Source:  MD Referral Screening Reason for Referral:  Disease Management Education Insurance:  [employer] Medicare   Outreach Attempt:  Outreach attempt #1 to patient for introductions and initial telephone assessment. Telephone line busy on multiple call attempts.  Plan:  RN Health Coach will make another outreach attempt within the month of April.   [place] [phone] [email or web address]

## 2018-09-03 ENCOUNTER — Encounter: Payer: Self-pay | Admitting: *Deleted

## 2018-09-03 ENCOUNTER — Telehealth: Payer: Self-pay | Admitting: Cardiology

## 2018-09-03 ENCOUNTER — Other Ambulatory Visit: Payer: Self-pay | Admitting: *Deleted

## 2018-09-03 NOTE — Patient Outreach (Addendum)
[facility]) Care Management  [place]  09/03/2018   [name] Sep 13, 1940 [id number]   RN Health Coach Initial Assessment   Referral Date:  07/25/2018 Referral Source:  MD Referral Screening Reason for Referral:  Disease Management Education Insurance:  [employer] Medicare   Outreach Attempt:  Successful telephone outreach to patient for introduction and initial telephone assessment.  HIPAA verified with patient.  RN Health Coach introduced self and role.  Patient verbally agrees to Disease Management outreaches.  Patient completed initial telephone assessment.  Social:  Patient reporting he lives at home with wife and stepson.  Reports being independent with ADLs and IADLs.  Ambulates independently but reporting about 10 falls in the past year without injury.  States his balance has been a lot better since his hospitalization in March but has falling about 2 times since then.  Relates his falls to his balance and his foot/leg loss of sense and foot drop.  Transports himself or wife drives to medical appointments.  DME in the home include:  Straight cane, rolling walker, upper and lower dentures, wheelchair, crutches, grab bars in shower and around toilet, bedside commode, scale, and shower chair.   Conditions:  Per chart review and discussion with patient, PMH include but not limited to:  Hypertension, hyperlipidemia, combined systolic and diastolic congestive heart failure, myocardial infarction with coronary artery disease with stenting, right foot drop, peptic ulcer disease, and neuromuscular disease.  Patient with recent STEMI and coronary stenting to OM1 and RCA in March 2020.  Had episode of heart failure during this recent hospitalization.  States he remembers the providers mentioning fluid overload, but does not remember being diagnosed with heart failure.  Continued to have shortness of breath after discharged, and this was related to [place].  Brilinta has  been discontinued and patient initiated on Plavix.  Patient stating his shortness of breath is better off the Brilinta, but he continues to have periods of shortness of breath.  States it is worse at night time when he lies down to go to sleep and wakes up severely short of breath.  Reports at other times he could just be sitting there or completing a task.  Does report swelling in right lower extremity, but also reporting nerve damage and foot drop on his right leg/foot.  Encouraged patient to contact Cardiology for possible heart failure exacerbation.  Does not weigh himself.  Discussed importance of daily weight monitoring and when to call the physician based on weight.  Medications:  Reports taking about 5 medications.  Manages his medications himself and denies any issues affording his medications at this time.  Encounter Medications:  Outpatient Encounter Medications as of 09/03/2018  Medication Sig Note  . aspirin EC 81 MG EC tablet Take 1 tablet (81 mg total) by mouth daily.   . clopidogrel (PLAVIX) 75 MG tablet Take 1 tablet 09/03/2018: Takes daily  . metoprolol succinate (TOPROL-XL) 25 MG 24 hr tablet Take 1 tablet (25 mg total) by mouth daily.   . nitroGLYCERIN (NITROSTAT) 0.4 MG SL tablet Place 1 tablet (0.4 mg total) under the tongue every 5 (five) minutes as needed for chest pain.   . rosuvastatin (CRESTOR) 40 MG tablet Take 1 tablet (40 mg total) by mouth daily at 6 PM.    No facility-administered encounter medications on file as of 09/03/2018.     Functional Status:  In your present state of health, do you have any difficulty performing the following activities: 09/03/2018 07/23/2018  Hearing? Y -  Comment difficulties hearing but no hearing aids at this time -  Vision? Y -  Comment vision still blurry with reading glasses, has not had eye exam in about 60 years -  Difficulty concentrating or making decisions? Y -  Comment trouble remembering things -  Walking or climbing stairs? N  -  Dressing or bathing? N -  Doing errands, shopping? N -  Preparing Food and eating ? N Y  Comment - some difficulty now due to recent MI and stent placement  Using the Toilet? N N  In the past six months, have you accidently leaked urine? N N  Do you have problems with loss of bowel control? N N  Managing your Medications? N N  Managing your Finances? N N  Housekeeping or managing your Housekeeping? N Y  Comment - weakness due to current medical state  Some recent data might be hidden    Fall/Depression Screening: Fall Risk  09/03/2018 05/11/2018 02/12/2018  Falls in the past year? 1 0 No  Comment about 10 falls in the last year - -  Number falls in past yr: 1 0 -  Injury with Fall? 0 0 -  Comment - - -  Risk Factor Category  - - -  Risk for fall due to : History of fall(s);Impaired vision;Impaired balance/gait;Medication side effect;Impaired mobility - -  Follow up Falls evaluation completed;Falls prevention discussed;Education provided - -   PHQ 2/9 Scores 09/03/2018 07/23/2018 02/12/2018 08/11/2017 07/18/2017 02/09/2017 12/26/2016  PHQ - 2 Score 0 0 0 0 0 0 0   THN CM Care Plan Problem One     Most Recent Value  Care Plan Problem One  Knowledge deficiet related to diagnosis of congestive heart failure.  Role Documenting the Problem One  Frannie for Problem One  Active  [facility] Long Term Goal   Patient will not have any unplanned hospitalizations within the next 90 days.  THN Long Term Goal Start Date  09/03/18  Interventions for Problem One Long Term Goal  Care plan and goals reviewed and discussed with patient, encouraged to keep and attend scheduled medical appointments, reviewed medications and indications and encouraged medication compliance, discussed importance of medication compliance, reviewed NTG instructions and when to call 911 (states he was instructed to call after taking 1st NTG--encouraged patient to discuss protocol with Cardiologist), sending Living Better  with Heart Failure Packet, reviewed signs and symtoms of congestive heart failure, introduced Heart Failure Zones to patient, encouraged patient to review educational materials when he recieved them,  encouraged social distancing/masking in public/staying at home as much as possible to prevent in person contact, falls precautions and preventions reviewed and discussed with patient  [facility]N CM Short Term Goal #1   Patient will report weighing himself daily within the next 30 days.  Interventions for Short Term Goal #1  Reviewed signs and symptoms of congestive heart failure, sending 2020 Calendar Booklet to assist with documentation of daily weights, encouraged patient to weigh himself daily, discussed importance of daily weight monitoring and when to contact physcian based on weights, discussed and instructed patient on when to weigh himself (first thing in the morning after emptying bladder), confirmed patient has working scale in the home to weigh himself  [facility] CM Short Term Goal #2   Patient will contact Cardiologist to notify of continued shortness of breath especially at night preventing sleep within the next 10 days.  THN CM Short Term Goal #2 Start  Date  09/03/18  Interventions for Short Term Goal #2  Instructed and encouraged patient contact cardiologist office to notify of continued shortness of breath, discussed continued shortness of breath after discontinuing brilinta and starting plavix, discussed importance of notifying cardiologist of continued shortness of breath     Appointments:  Last attended Telehealth appointment with primary care provider, Dr. Livia Snellen on 07/31/2018 and has scheduled follow up on 02/13/2019.  Follow up with Cardiologist on 09/27/2018.  Advanced Directives:  Denies having Advance Directive in place and does not wish to create one at this time.   Consent:  [facility] services reviewed and discussed.  Patient verbally agrees to Disease Management outreaches.  Plan: RN Health Coach  will send primary MD barriers letter. RN Health Coach will route initial telephone assessment note to primary MD. [place] will send patient [place]. RN Health Coach will send patient 2020 Calendar Booklet. RN Health Coach will send patient Living Better with Heart Failure Packet. RN Health Coach will make next telephone outreach to patient in the month of May.  [place] [phone] [email or web address]

## 2018-09-03 NOTE — Telephone Encounter (Signed)
Per pt phone call-- he's having SOB, had a problem in the middle of the night and it woke him up.   Please call [phone]

## 2018-09-04 MED ORDER — FUROSEMIDE 20 MG PO TABS: ORAL_TABLET | ORAL | 3 refills | Status: DC

## 2018-09-04 NOTE — Telephone Encounter (Signed)
He had some SOB at our last visit, we were thinking it was from [place] and changed him to plavix, however appears its still a problem. One of his heart pressures was elevated during his heart cath suggesting he may be keeping some extra fluid which could cause the symptoms. Can we start lasix 20mg  daily x 4 days, then prn swelling or SOb. Have him update [place] on his symptoms early next week   Zandra Abts MD

## 2018-09-04 NOTE — Telephone Encounter (Signed)
I spoke with patient, he will take lasix 20 mg daily for 4 days and then PRN for leg swelling and SOB

## 2018-09-04 NOTE — Telephone Encounter (Signed)
Patient describes these "spells" of SOB, "like asthma" have been bothering him. Feels like he can't catch air-can last for a few minutes up to 15-20 minutes. Says this is noted after his stent in March. He may go days without symptoms.Occurs while sitting or sleeping, has not done any strenuous activity. He has no BP monitor at home, states home weight is 200 lbs  His sleep was "descent" last night

## 2018-09-25 ENCOUNTER — Telehealth: Payer: Self-pay | Admitting: Cardiology

## 2018-09-25 NOTE — Telephone Encounter (Signed)

## 2018-09-27 ENCOUNTER — Encounter: Payer: Self-pay | Admitting: Cardiology

## 2018-09-27 ENCOUNTER — Telehealth (INDEPENDENT_AMBULATORY_CARE_PROVIDER_SITE_OTHER): Payer: Medicare HMO | Admitting: Cardiology

## 2018-09-27 VITALS — Ht 74.5 in | Wt 196.0 lb

## 2018-09-27 DIAGNOSIS — R0602 Shortness of breath: Secondary | ICD-10-CM

## 2018-09-27 DIAGNOSIS — I5033 Acute on chronic diastolic (congestive) heart failure: Secondary | ICD-10-CM

## 2018-09-27 DIAGNOSIS — I2510 Atherosclerotic heart disease of native coronary artery without angina pectoris: Secondary | ICD-10-CM

## 2018-09-27 DIAGNOSIS — I10 Essential (primary) hypertension: Secondary | ICD-10-CM

## 2018-09-27 MED ORDER — FUROSEMIDE 20 MG PO TABS: ORAL_TABLET | ORAL | 3 refills | Status: DC

## 2018-09-27 NOTE — Patient Instructions (Addendum)
Your physician wants you to follow-up in: [place] will receive a reminder letter in the mail two months in advance. If you don't receive a letter, please call our office to schedule the follow-up appointment.  Your physician has recommended you make the following change in your medication:   TAKE LASIX 20 MG S NEEDED FOR WEIGHT OVER 195LBS/SHORTNESS OF BREATH/SWELLING  Your physician recommends that you return for lab work in: 2 WEEKS BMP/MG  Thank you for choosing [facility]!!

## 2018-09-27 NOTE — Progress Notes (Signed)
Virtual Visit via Telephone Note   This visit type was conducted due to national recommendations for restrictions regarding the COVID-19 Pandemic (e.g. social distancing) in an effort to limit this patient's exposure and mitigate transmission in our community.  Due to his co-morbid illnesses, this patient is at least at moderate risk for complications without adequate follow up.  This format is felt to be most appropriate for this patient at this time.  The patient did not have access to video technology/had technical difficulties with video requiring transitioning to audio format only (telephone).  All issues noted in this document were discussed and addressed.  No physical exam could be performed with this format.  Please refer to the patient's chart for his  consent to telehealth for [facility].   Date:  09/27/2018   ID:  [name], DOB 1940-12-21, MRN [id number]  Patient Location: Home Provider Location: Office  PCP:  Claretta Fraise, MD  Cardiologist:  Carlyle Dolly, MD  Electrophysiologist:  None   Evaluation Performed:  Follow-Up Visit  Chief Complaint:  SOB  History of Present Illness:    [name] is a 78 y.o. male seen today for a focused follow up for history of CAD and recent SOB.    1. CAD - multiple PCIs in the past - hx of MI in 2004, with stenting to RCA (ostium down to PDA). Of note had GI bleed 2 months later on DAPT and NSAIDs requiring transfusion.  - cath from 2007 with occluded LCX OM, received a BMS. RCA stents were patent at that time. LVEF 70% by LV gram at that time, no echoes in our system   - admit 07/2018 with acute posterior STEMI, taken to cath lab - 07/2018 cath showed ostal LAD 60%, mid LAD 99%, D1 95%, OM1 occluded, RCA 30%, mid RCA 95%. Received DES to OM1, then staged PCI to RCA. From notes no options on treating LAD, diffusely diseased and no target for CABG. Recs for indefinitie DAPT. LVEDP 29, limited diuresis due to uptrend in  Cr.  - isoalted episode of chest pain last night. Sharp pain left sided, just a few seconds. - SOB/DOE can occur at rest or activity, suddenly comes on him. Feels anxious, lasts 5-10 minutes. Can occur with laying down at night, not better with sitting up.    - last visit changed from brillinta to plavix due to SOB. Had some improvement but not resolution. Due to LVEDP at cath we tried lasix 20mg  daily x 4 days then just prn - improved with lasix. Weights normally 195-202 lbs.  - on lasix got down to 198 lbs, weight trended back up after taking.      The patient does not have symptoms concerning for COVID-19 infection (fever, chills, cough, or new shortness of breath).    Past Medical History:  Diagnosis Date  . Bradycardia   . CAD in native artery   . Foot drop, right foot   . History of GI bleed   . History of mitral valve prolapse   . History of peptic ulcer disease   . Hyperlipidemia, mixed   . Hypertension   . MI (myocardial infarction) ([place]) 2000, 2004  . Neuromuscular disorder [facility])    Past Surgical History:  Procedure Laterality Date  . CORONARY STENT INTERVENTION N/A 07/18/2018   Procedure: CORONARY STENT INTERVENTION;  Surgeon: Martinique, Peter M, MD;  Location: [place] CV [facility];  Service: Cardiovascular;  Laterality: N/A;  . CORONARY STENT INTERVENTION  N/A 07/20/2018   Procedure: CORONARY STENT INTERVENTION;  Surgeon: Martinique, Peter M, MD;  Location: [place] [facility];  Service: Cardiovascular;  Laterality: N/A;  . CORONARY STENT PLACEMENT  2004, 2007   5 in 2004, 1 in 2007  . CORONARY/GRAFT ACUTE MI REVASCULARIZATION N/A 07/18/2018   Procedure: Coronary/Graft Acute MI Revascularization;  Surgeon: Martinique, Peter M, MD;  Location: [place] CV [facility];  Service: Cardiovascular;  Laterality: N/A;  . LEFT HEART CATH AND CORONARY ANGIOGRAPHY N/A 07/18/2018   Procedure: LEFT HEART CATH AND CORONARY ANGIOGRAPHY;  Surgeon: Martinique, Peter M, MD;  Location: [place] [facility];   Service: Cardiovascular;  Laterality: N/A;     Current Meds  Medication Sig  . aspirin EC 81 MG EC tablet Take 1 tablet (81 mg total) by mouth daily.  . clopidogrel (PLAVIX) 75 MG tablet Take 1 tablet  . furosemide (LASIX) 20 MG tablet Take 20 mg daily for 4 days, THEN you can take daily as needed for leg swelling and shortness of breath  . metoprolol succinate (TOPROL-XL) 25 MG 24 hr tablet Take 1 tablet (25 mg total) by mouth daily.  . nitroGLYCERIN (NITROSTAT) 0.4 MG SL tablet Place 1 tablet (0.4 mg total) under the tongue every 5 (five) minutes as needed for chest pain.  . rosuvastatin (CRESTOR) 40 MG tablet Take 1 tablet (40 mg total) by mouth daily at 6 PM.     Allergies:   Brilinta [ticagrelor] and Ibuprofen   Social History   Tobacco Use  . Smoking status: Former Smoker    Last attempt to quit: 05/09/1976    Years since quitting: 42.4  . Smokeless tobacco: Never Used  Substance Use Topics  . Alcohol use: No  . Drug use: No     Family Hx: The patient's family history includes Cancer in his daughter; Coronary artery disease in an other family member; Heart attack in his father and mother.  ROS:   Please see the history of present illness.     All other systems reviewed and are negative.   Prior CV studies:   The following studies were reviewed today:  Cath 2007 Central aortic pressure is 101/68 with a mean of 84. LV pressure is 94/1  with an EDP of 6. There is no aortic stenosis.  Left main was normal.  LAD was a large vessel coursing to the apex. There was very prominent  severe ectasia in the proximal portion with aneurysmal dilatation and  swirling flow. However, no high grade stenosis. In the mid LAD, there was  a diffuse 40-50% segmental plaquing. In the distal LAD, there was a tubular  70-80% stenosis. There also were two diagonals. The first diagonal is a  medium vessel with a long 90% stenosis. The second diagonal was small with  branching vessel  with a 70-80% proximal lesion.  The left circumflex was a moderate sized system. It gave off a large  branching OM-1 and a small OM-2. There is a 40% proximal lesion in the  circumflex. The lower M[place]ue Peery of the OM was totally occluded and the distal  part of the vessel filled through collaterals with TIMI I flow. There is a  40% blockage in the upper portion of the OM.  The right coronary artery was a large dominant vessel. It gave off a  moderate sized PDA and two small PLs. There was evidence of previously  placed stent from the ostial portion down to the PDA. There was 40% ostial  stenosis  with damping of the 6-French catheter. This did not change with  sublingual nitroglycerin. There is a 30% lesion in the proximal to mid  portion.  Left ventriculogram done the RAO position showed an EF of 70% with mild  mitral valve prolapse. There was no wall motion abnormalities and there was  no mitral regurgitation. Left ventriculogram done in the LAO position  showed an EF of 70% with no regional wall motion abnormalities.  Abdominal aortogram showed patent renal arteries bilaterally with no  abdominal aortic aneurysm.  A left subclavian done for possible bypass surgery showed a patent LIMA to  the chest wall with no obstructions in the subclavian.  ASSESSMENT:  1. Three vessel native coronary artery disease.  2. The LAD has diffuse ectasia and swelling flow proximally as described  above. There is a borderline lesion in the distal LAD and a high grade  disease in the diagonal, but these do not appear to be the culprit  lesion.  3. Totally occluded OM1 which appears to be the culprit.  4. Patent right coronary stents.  5. Normal LV function with mild mitral valve prolapse but no significant  mitral regurgitation.  CONCLUSION: Successful PCI of a totally occluded circumflex marginal vessel  with improvement in narrowing from 100% to 0% and improvement in flow  from  TIMI 0 to TIMI III flow using a bare metal stent.   07/2018 echo IMPRESSIONS   1. The left ventricle has low normal systolic function, with an ejection fraction of 50-55%. The cavity size was normal. Left ventricular diastolic Doppler parameters are indeterminate. 2. Mild hypokinesis of the left ventricular, mid-apical inferoseptal wall, inferior segment and inferolateral wall. 3. The right ventricle has normal systolic function. The cavity was normal. There is no increase in right ventricular wall thickness. 4. The aortic valve is tricuspid Mild thickening of the aortic valve Mild calcification of the aortic valve. Aortic valve regurgitation was not assessed by color flow Doppler.   07/2018 cath  Previously placed Mid RCA to Dist RCA stent (unknown type) is widely patent.  Ost RCA to Mid RCA lesion is 30% stenosed.  Mid RCA lesion is 95% stenosed.  Ost 1st Mrg to 1st Mrg lesion is 100% stenosed.  Ost LAD to Prox LAD lesion is 60% stenosed.  Prox LAD lesion is 25% stenosed.  Ost 1st Diag to 1st Diag lesion is 95% stenosed.  Mid LAD to Dist LAD lesion is 99% stenosed.  Post intervention, there is a 0% residual stenosis.  A drug-eluting stent was successfully placed using a STENT SYNERGY DES 3X24.  LV end diastolic pressure is moderately elevated.  1. Severe 3 vessel obstructive CAD - Severe aneurysmal disease in the proximal LAD. Diffuse 99% stenosis in the mid to distal LAD. 95% bifurcating first diagonal.  - 100% occlusion of the large first OM at site of prior stent. This is the culprit lesion - 95% mid RCA in stent 2. Moderately elevated LVEDP 3. Successful PCI of the OM 1 with DES x1  Discussed with Dr. Gwenlyn Found. There are no options for treating the LAD. It is diffusely diseased and there is no target for CABG. There are some right to left collaterals to the distal LAD. We proceeded with emergent PCI of the OM1. Recommend DAPT indefinitely given  multiple layers of stent. Plan to bring patient back in 2 days for PCI of the RCA. Will treat the LAD medically. Check LV function by Echo. Maximize medical therapy. Stress importance of medication  compliance.    07/2018 staged PCI  Mid RCA lesion is 95% stenosed.  A drug-eluting stent was successfully placed using a STENT SYNERGY DES 3.5X16.  Post intervention, there is a 0% residual stenosis.  1. Successful PCI of the mid RCA with DES x 1.  Plan: continue DAPT indefinitely. Anticipate DC home in am.  Labs/Other Tests and Data Reviewed:    EKG:  No ECG reviewed.  Recent Labs: 10/04/2017: TSH 3.91 02/12/2018: ALT 14 07/21/2018: BUN 20; Creatinine, Ser 1.31; Hemoglobin 13.5; Platelets 212; Potassium 3.4; Sodium 139   Recent Lipid Panel Lab Results  Component Value Date/Time   CHOL 280 (H) 02/12/2018 01:03 PM   TRIG 128 02/12/2018 01:03 PM   TRIG 119 06/13/2014 12:29 PM   TRIG 233 ([place]) 05/17/2006 08:31 AM   HDL 47 02/12/2018 01:03 PM   HDL 47 06/13/2014 12:29 PM   CHOLHDL 6.0 (H) 02/12/2018 01:03 PM   CHOLHDL 4.9 10/11/2010 08:28 AM   LDLCALC 207 (H) 02/12/2018 01:03 PM   LDLDIRECT 142.8 01/28/2008 08:48 AM    Wt Readings from Last 3 Encounters:  09/27/18 196 lb (88.9 kg)  07/26/18 207 lb 3.2 oz (94 kg)  07/21/18 184 lb 15.5 oz (83.9 kg)     Objective:    Vital Signs:  Ht 6' 2.5" (1.892 m)   Wt 196 lb (88.9 kg)   BMI 24.83 kg/m    Today's Vitals   09/27/18 1329  Weight: 196 lb (88.9 kg)  Height: 6' 2.5" (1.892 m)   Body mass index is 24.83 kg/m.  Normal affect. Normal speech pattern and tone. Comfortable in no apparent distress. No audible signs of SOB or wheezing.   ASSESSMENT & PLAN:    1. CAD/SOB/Acute on chronic diastolic HF -recent STEMI and PCI as reported - no recent chest pain but has had intermittent issues with SOB since procedure - symptoms improved some after changing brillinta to plavix but did not resolve - had elevated LVEDP at cath  Trial of oral lasix at home. Took for 4 days and weight decreased from 200 to 198 lbs, stopped and weights have trended back up. His normal range is around 195 to 202 per his report - take lasix 20mg  prn weight above 195 lbs, follow symptoms with more regular diuresis.  - check BMET/Mg in 2 weeks    COVID-19 Education: The signs and symptoms of COVID-19 were discussed with the patient and how to seek care for testing (follow up with PCP or arrange E-visit).  The importance of social distancing was discussed today.  Time:   Today, I have spent 19 minutes with the patient with telehealth technology discussing the above problems.     Medication Adjustments/Labs and Tests Ordered: Current medicines are reviewed at length with the patient today.  Concerns regarding medicines are outlined above.   Tests Ordered: No orders of the defined types were placed in this encounter.   Medication Changes: No orders of the defined types were placed in this encounter.   Disposition:  Follow up 6 weeks  Signed, Carlyle Dolly, MD  09/27/2018 1:41 PM    [place]

## 2018-09-28 ENCOUNTER — Other Ambulatory Visit: Payer: Self-pay | Admitting: *Deleted

## 2018-09-28 ENCOUNTER — Encounter: Payer: Self-pay | Admitting: *Deleted

## 2018-09-28 NOTE — Patient Outreach (Signed)
[place] [facility]) Care Management  09/28/2018  [name] 03/27/1941 [id number]   Startup Monthly Outreach  Referral Date:07/25/2018 Referral Source:MD Referral Screening Reason for Referral:Disease Management Education Insurance:[employer] Medicare   Outreach Attempt:  Successful telephone outreach to patient for follow up.  HIPAA verified with patient.  Patient did contact cardiology after last month's outreach and received prescription for lasix.  Took lasix as prescribed and had follow up appointment yesterday.  Continues to have shortness of breath at times, but states it is a lot better.  Is now weighing daily.  Weight this morning was 199 pounds, but not down to his baseline of 195 pounds.  Per instruction patient is to take lasix for weights greater that 195 pounds and patient verbalizes he is following these instructions.  Denies any swelling in lower extremities.  Encouraged patient to continue to review heart failure education.  Appointments:  Attended telehealth appointment with Cardiologist, Dr. Harl Bowie on 09/27/2018.  Last appointment with primary care provider was 07/31/2018 and has scheduled follow up on 02/13/2019.  Plan: RN Health Coach will make next telephone outreach to patient within the month of July.  [name] [phone] [name]

## 2018-10-23 ENCOUNTER — Other Ambulatory Visit: Payer: Self-pay

## 2018-10-23 ENCOUNTER — Telehealth: Payer: Medicare HMO

## 2018-10-24 ENCOUNTER — Other Ambulatory Visit: Payer: Self-pay | Admitting: Cardiology

## 2018-11-22 ENCOUNTER — Other Ambulatory Visit: Payer: Self-pay | Admitting: *Deleted

## 2018-11-22 ENCOUNTER — Encounter: Payer: Self-pay | Admitting: *Deleted

## 2018-11-22 NOTE — Patient Outreach (Signed)
[name]) Care Management  [place] [name] Care Manager  11/22/2018   [name] [birth year] [id number]   [place] Quarterly Outreach   Referral Date:  07/25/2018 Referral Source:  MD Referral Screening Reason for Referral:  Disease Management Education Insurance:  [employer]na Medicare   Outreach Attempt:  Successful telephone outreach to patient for follow up.  HIPAA verified with patient.  Patient reporting he is doing well.  Just finished cutting the lawn.  Reports shortness of breath has improved, with only occasional episodes.  Does report right ankle swelling, that patient states he has had for years.  Continues to weigh himself daily.  Weight this morning was 196 pounds, which is above his diuretic parameter of 195 pounds.  States his weight is usually above the 195 pound limit and he takes his diuretic almost daily.  Denies any recent issues and concerns.  Reports compliance with Plavix and all other medications.  Encounter Medications:  Outpatient Encounter Medications as of 11/22/2018  Medication Sig  . aspirin EC 81 MG EC tablet Take 1 tablet (81 mg total) by mouth daily.  . clopidogrel (PLAVIX) 75 MG tablet Take 1 tablet by mouth once daily  . furosemide (LASIX) 20 MG tablet TAKE 20 MG DAILY AS NEEDED FOR WEIGHT OVER 195LBS OR SWELLING  . metoprolol succinate (TOPROL-XL) 25 MG 24 hr tablet Take 1 tablet (25 mg total) by mouth daily.  . nitroGLYCERIN (NITROSTAT) 0.4 MG SL tablet Place 1 tablet (0.4 mg total) under the tongue every 5 (five) minutes as needed for chest pain.  . rosuvastatin (CRESTOR) 40 MG tablet Take 1 tablet (40 mg total) by mouth daily at 6 PM.   No facility-administered encounter medications on file as of 11/22/2018.     Functional Status:  In your present state of health, do you have any difficulty performing the following activities: 09/03/2018 07/23/2018  Hearing? Y -  Comment difficulties hearing but no hearing aids at this time -  Vision?  Y -  Comment vision still blurry with reading glasses, has not had eye exam in about 60 years -  Difficulty concentrating or making decisions? Y -  Comment trouble remembering things -  Walking or climbing stairs? N -  Dressing or bathing? N -  Doing errands, shopping? N -  Preparing Food and eating ? N Y  Comment - some difficulty now due to recent MI and stent placement  Using the Toilet? N N  In the past six months, have you accidently leaked urine? N N  Do you have problems with loss of bowel control? N N  Managing your Medications? N N  Managing your Finances? N N  Housekeeping or managing your Housekeeping? N Y  Comment - weakness due to current medical state  Some recent data might be hidden    Fall/Depression Screening: Fall Risk  11/22/2018 09/03/2018 05/11/2018  Falls in the past year? 1 1 0  Comment about 10 falls in the last year but none in the last few months about 10 falls in the last year -  Number falls in past yr: 1 1 0  Injury with Fall? 0 0 0  Comment - - -  Risk Factor Category  - - -  Risk for fall due to : History of fall(s);Medication side effect;Impaired balance/gait History of fall(s);Impaired vision;Impaired balance/gait;Medication side effect;Impaired mobility -  Follow up Falls evaluation completed;Education provided;Falls prevention discussed Falls evaluation completed;Falls prevention discussed;Education provided -   [facility] 2/9 Scores 09/03/2018  07/23/2018 02/12/2018 08/11/2017 07/18/2017 02/09/2017 12/26/2016  PHQ - 2 Score 0 0 0 0 0 0 0   THN CM Care Plan Problem One     Most Recent Value  Care Plan Problem One  Knowledge deficiet related to diagnosis of congestive heart failure.  Role Documenting the Problem One  [place] for Problem One  Active  [facility] Long Term Goal   Patient will report no emergency room visits or hospitalizations in the next 90 days.  THN Long Term Goal Start Date  11/22/18  THN Long Term Goal Met Date  11/22/18   Interventions for Problem One Long Term Goal  Reviewed and discussed care plan and goals, encouraged patient to review heart failure educational packet mailed out, confirmed patient has heart failure eduational packet, encouraged patient to keep and attend scheduled medical appointments, encouraged patient to arrange Cardiology follow up appointment, reviewed signs and symptoms of heart failure and discussed daily weight monitoring and action plan based on weight, reviewed medications and encoruaged medication compliance  THN CM Short Term Goal #1      THN CM Short Term Goal #2         Appointments:  Last attended appointment with Dr. Harl Bowie, Cardiologist on 09/27/2018.  Has scheduled appointment with primary care provider, Dr. Livia Snellen on 02/13/2019.  Plan: RN Health Coach will send primary care provider quarterly update.   RN Health Coach will make next telephone outreach to patient within the month of October  [place] [phone] [email or web address]

## 2018-12-07 ENCOUNTER — Encounter: Payer: Self-pay | Admitting: Neurology

## 2018-12-07 ENCOUNTER — Telehealth (INDEPENDENT_AMBULATORY_CARE_PROVIDER_SITE_OTHER): Payer: Medicare HMO | Admitting: Neurology

## 2018-12-07 ENCOUNTER — Other Ambulatory Visit: Payer: Self-pay

## 2018-12-07 VITALS — Ht 74.5 in | Wt 196.0 lb

## 2018-12-07 DIAGNOSIS — G3184 Mild cognitive impairment, so stated: Secondary | ICD-10-CM | POA: Diagnosis not present

## 2018-12-07 NOTE — Progress Notes (Signed)
Virtual Visit via Telephone Note The purpose of this virtual visit is to provide medical care while limiting exposure to the novel coronavirus.    Consent was obtained for phone visit:  Yes.   Answered questions that patient had about telehealth interaction:  Yes.   I discussed the limitations, risks, security and privacy concerns of performing an evaluation and management service by telephone. I also discussed with the patient that there may be a patient responsible charge related to this service. The patient expressed understanding and agreed to proceed.  Pt location: Home Physician Location: office Name of referring provider:  Claretta Fraise, MD I connected with .[name] at patients initiation/request on 12/07/2018 at  3:30 PM EDT by telephone and verified that I am speaking with the correct person using two identifiers.  Pt MRN:  [id number] Pt DOB:  1941-04-30   History of Present Illness:  The patient had a phone visit on 12/07/2018. He was last seen in the neurology clinic in January 2020 for worsening memory. [place] 27/30 in January 2020. His wife is present during today's visit. They both feel that his memory is okay, about the same. He continues to manage medications and bills without any difficulties. We had previously discussed no further driving due to his right foot drop. They report that he is not driving a whole lot, no accidents, he does not get lost. He denies any headaches, dizziness, vision changes. No falls. Mood and sleep are good. No paranoia or hallucinations.   History on Initial Assessment 10/04/2017: This is a 78 year old right-handed man with a history of hypertension, CAD s/p stents, right foot drop, presenting for evaluation of memory loss. He and his wife started noticing changes around a year ago. He feels he has a lost a lot of his memory and his mind is not as clear. He used to remember things really well, but now he would forget where he put things or what  his wife asked him to get. He left the water running one time. He forgets words of familiar songs. Sometimes he would be driving and wonder where he is, especially unfamiliar roads. He denies missing medications. His wife is in charge of finances. He occasionally repeats himself. He would write things down, but forget where he put his notepad down. He is independent with dressing and bathing. No personality changes, paranoia, or hallucinations. No family history of dementia. He fell off a golf cart with brief loss of consciousness 14 years ago. No alcohol use. He had an MMSE at his PCP office in March 2019 which was normal, 28/30.  He has occasional dizziness on standing, his legs feel rubbery, and has had some falls. He also has chronic foot drop for the past 4-5 years with right calf atrophy, also causing falls. He reports having an evaluation in [place] with an EMG/NCV and did PT, but was told "nothing could be done." He has occasional low back pain with a pinched nerve in his right leg going down the big toe. He has occasional tingling in his right arm and leg.[name] He has blurred vision but has not seen an eye doctor in 10 years. He occasionally gets strangled swallowing liquids and solids. He denies any headaches, dysarthria, neck pain, bowel/bladder dysfunction, anosmia, tremors.     Observations/Objective:  Limited due to nature of phone visit. He is awake, alert, able to answer questions without dysarthria or confusion noted.  Montreal Cognitive Assessment Blind 12/07/2018  Attention: Read list of digits (0/2) 2    Attention: Read list of letters (0/1) 1    Attention: Serial 7 subtraction starting at 100 (0/3) 3    Language: Repeat phrase (0/2) 2    Language : Fluency (0/1) 0    Abstraction (0/2) 2    Delayed Recall (0/5) 3    Orientation (0/6) 4    Total 17    Adjusted Score (based on education) 18/22      Assessment and Plan:   This is a 78 yo RH  man with a history of hypertension,  CAD s/p stents, right foot drop, with mild cognitive impairment, MOCA blind done over phone today normal 18/22 ([place] 27/30 in 05/2018, 28/30 in 09/2017). They deny any difficulties with complex tasks. Memory appears to be stable, continue to monitor. We again discussed driving restrictions due to his right foot drop, he rarely drives, continue to monitor. He will follow-up in 1 year and knows to call for any changes.    Follow Up Instructions:   -I discussed the assessment and treatment plan with the patient. The patient was provided an opportunity to ask questions and all were answered. The patient agreed with the plan and demonstrated an understanding of the instructions.   The patient was advised to call back or seek an in-person evaluation if the symptoms worsen or if the condition fails to improve as anticipated.    Total Time spent in visit with the patient was:  15 minutes, of which 100% of the time was spent in counseling and/or coordinating care on the above.   Pt understands and agrees with the plan of care outlined.     Cameron Sprang, MD

## 2019-01-23 NOTE — Telephone Encounter (Signed)
error 

## 2019-01-24 DIAGNOSIS — R69 Illness, unspecified: Secondary | ICD-10-CM | POA: Diagnosis not present

## 2019-02-13 ENCOUNTER — Ambulatory Visit: Payer: Medicare HMO | Admitting: Family Medicine

## 2019-02-13 ENCOUNTER — Encounter: Payer: Medicare HMO | Admitting: Family Medicine

## 2019-02-14 ENCOUNTER — Other Ambulatory Visit: Payer: Self-pay | Admitting: *Deleted

## 2019-02-14 ENCOUNTER — Encounter: Payer: Self-pay | Admitting: *Deleted

## 2019-02-14 NOTE — Patient Outreach (Signed)
[place] [facility]) Care Management  [facility] Care Manager  02/14/2019   [name] Nov 28, 1940 [id number]   [name] Quarterly Outreach   Referral Date:  07/25/2018 Referral Source:  MD Referral Screening Reason for Referral:  Disease Management Education Insurance:  A[employer]na Medicare   Outreach Attempt:  Successful telephone outreach to patient for follow up.  HIPAA verified with patient.  Patient reporting a fall a few days ago.  States he lost his balance coming down some stairs and fell on his hip.  Did not seek medical attention and states he does not feel like anything is broken, just a little sore.  Fall precautions and preventions reviewed and discussed.  Encouraged patient to use cane or walker for all ambulation and encouraged to notify primary care provider of recent fall.  Continues to weigh daily.  Reports weight this morning was 205 pounds.  Patient stating his weight usually ranges 190-205 pounds and he thought his lasix was ordered to take for weight greater than 205 pounds.  Reviewed medications and cardiology notes and discussed with patient orders where to take Lasix for weights greater than 195 pounds.  After review patient stated he now remembers and plans to take for weights greater 195 pounds.  States shortness of breath is a little better than previous and denies any lower extremity edema.  Encounter Medications:  Outpatient Encounter Medications as of 02/14/2019  Medication Sig  . aspirin EC 81 MG EC tablet Take 1 tablet (81 mg total) by mouth daily.  . clopidogrel (PLAVIX) 75 MG tablet Take 1 tablet by mouth once daily  . furosemide (LASIX) 20 MG tablet TAKE 20 MG DAILY AS NEEDED FOR WEIGHT OVER 195LBS OR SWELLING  . metoprolol succinate (TOPROL-XL) 25 MG 24 hr tablet Take 1 tablet (25 mg total) by mouth daily.  . rosuvastatin (CRESTOR) 40 MG tablet Take 1 tablet (40 mg total) by mouth daily at 6 PM.  . nitroGLYCERIN (NITROSTAT) 0.4 MG SL tablet Place 1  tablet (0.4 mg total) under the tongue every 5 (five) minutes as needed for chest pain.   No facility-administered encounter medications on file as of 02/14/2019.     Functional Status:  In your present state of health, do you have any difficulty performing the following activities: 09/03/2018 07/23/2018  Hearing? Y -  Comment difficulties hearing but no hearing aids at this time -  Vision? Y -  Comment vision still blurry with reading glasses, has not had eye exam in about 60 years -  Difficulty concentrating or making decisions? Y -  Comment trouble remembering things -  Walking or climbing stairs? N -  Dressing or bathing? N -  Doing errands, shopping? N -  Preparing Food and eating ? N Y  Comment - some difficulty now due to recent MI and stent placement  Using the Toilet? N N  In the past six months, have you accidently leaked urine? N N  Do you have problems with loss of bowel control? N N  Managing your Medications? N N  Managing your Finances? N N  Housekeeping or managing your Housekeeping? N Y  Comment - weakness due to current medical state  Some recent data might be hidden    Fall/Depression Screening: Fall Risk  02/14/2019 12/07/2018 11/22/2018  Falls in the past year? '1 1 1  ' Comment - - about 10 falls in the last year but none in the last few months  Number falls in past yr: 1 1  1  Comment fall beginning of 10/20 - -  Injury with Fall? 0 0 0  Comment - - -  Risk Factor Category  - - -  Risk for fall due to : History of fall(s);Medication side effect;Impaired mobility;Impaired balance/gait History of fall(s);Impaired balance/gait;Impaired mobility;Mental status change History of fall(s);Medication side effect;Impaired balance/gait  Follow up Education provided;Falls evaluation completed;Falls prevention discussed Falls evaluation completed Falls evaluation completed;Education provided;Falls prevention discussed   PHQ 2/9 Scores 09/03/2018 07/23/2018 02/12/2018 08/11/2017  07/18/2017 02/09/2017 12/26/2016  PHQ - 2 Score 0 0 0 0 0 0 0   THN CM Care Plan Problem One     Most Recent Value  Care Plan Problem One  Knowledge deficiet related to diagnosis of congestive heart failure.  Role Documenting the Problem One  [place] for Problem One  Active  [facility] Long Term Goal   Patient will verbalize when to take his lasix (when weight is above 195 pounds) within the next 31 days.  THN Long Term Goal Start Date  02/14/19  THN Long Term Goal Met Date  02/14/19  Interventions for Problem One Long Term Goal  Care Plan and goals reviewed and discussed, reviewed medications and indications and encouraged medication compliance, reviewed with patient lasix instructions to take when weight is above 195 pounds and used Teach Back to have patient verbalize the instructions, encouraged continued daily weight monitoring, reviewed and discussed heart failure zones and what to do in each zone, reviewed signs and symptoms of heart failure, encouraged to keep and attend scheduled medical appointments, encouraged to review heart failure education  [facility] CM Short Term Goal #1   Patient will verbalize no falls within the next 30 days  THN CM Short Term Goal #1 Start Date  02/14/19  Interventions for Short Term Goal #1  Fall precautions and preventions reviewed and discussed, sent EMMI Getting up from a fall, sent EMMI Fall prevention, encouraged patient to use walker or cane with all ambulation to help with balance, encouraged patient to discuss recent fall with primary care provider  [facility] CM Short Term Goal #2         Appointments:  Patient states he has appointment with primary care provider, Dr. Livia Snellen on 02/21/2019.  Plan: RN Health Coach will send primary care provider quarterly update. RN Health Coach will make next telephone outreach to patient within the month of November. RN Health Coach will send EMMI Getting Up From a Fall. RN Health Coach will send EMMI Preventing  Falls.  [name] [phone] [name]

## 2019-02-21 ENCOUNTER — Encounter: Payer: Self-pay | Admitting: Family Medicine

## 2019-02-21 ENCOUNTER — Other Ambulatory Visit: Payer: Self-pay

## 2019-02-21 ENCOUNTER — Ambulatory Visit (INDEPENDENT_AMBULATORY_CARE_PROVIDER_SITE_OTHER): Payer: Medicare HMO | Admitting: Family Medicine

## 2019-02-21 ENCOUNTER — Ambulatory Visit (INDEPENDENT_AMBULATORY_CARE_PROVIDER_SITE_OTHER): Payer: Medicare HMO

## 2019-02-21 VITALS — BP 119/73 | HR 55 | Temp 98.0°F | Resp 20 | Ht 74.0 in | Wt 205.0 lb

## 2019-02-21 DIAGNOSIS — R0609 Other forms of dyspnea: Secondary | ICD-10-CM

## 2019-02-21 DIAGNOSIS — M25551 Pain in right hip: Secondary | ICD-10-CM | POA: Diagnosis not present

## 2019-02-21 DIAGNOSIS — E785 Hyperlipidemia, unspecified: Secondary | ICD-10-CM | POA: Diagnosis not present

## 2019-02-21 DIAGNOSIS — Z0001 Encounter for general adult medical examination with abnormal findings: Secondary | ICD-10-CM

## 2019-02-21 DIAGNOSIS — Z125 Encounter for screening for malignant neoplasm of prostate: Secondary | ICD-10-CM | POA: Diagnosis not present

## 2019-02-21 DIAGNOSIS — I10 Essential (primary) hypertension: Secondary | ICD-10-CM | POA: Diagnosis not present

## 2019-02-21 DIAGNOSIS — S79911A Unspecified injury of right hip, initial encounter: Secondary | ICD-10-CM | POA: Diagnosis not present

## 2019-02-21 DIAGNOSIS — S7001XA Contusion of right hip, initial encounter: Secondary | ICD-10-CM

## 2019-02-21 DIAGNOSIS — R0600 Dyspnea, unspecified: Secondary | ICD-10-CM | POA: Diagnosis not present

## 2019-02-21 DIAGNOSIS — J984 Other disorders of lung: Secondary | ICD-10-CM | POA: Diagnosis not present

## 2019-02-21 DIAGNOSIS — E559 Vitamin D deficiency, unspecified: Secondary | ICD-10-CM | POA: Diagnosis not present

## 2019-02-21 DIAGNOSIS — S9031XA Contusion of right foot, initial encounter: Secondary | ICD-10-CM

## 2019-02-21 DIAGNOSIS — I2510 Atherosclerotic heart disease of native coronary artery without angina pectoris: Secondary | ICD-10-CM | POA: Diagnosis not present

## 2019-02-21 DIAGNOSIS — Z0000 Encounter for general adult medical examination without abnormal findings: Secondary | ICD-10-CM

## 2019-02-21 DIAGNOSIS — M79671 Pain in right foot: Secondary | ICD-10-CM | POA: Diagnosis not present

## 2019-02-21 DIAGNOSIS — S99921A Unspecified injury of right foot, initial encounter: Secondary | ICD-10-CM | POA: Diagnosis not present

## 2019-02-21 NOTE — Progress Notes (Signed)
Subjective:  Patient ID: [name], male    DOB: 08/25/1940  Age: 78 y.o. MRN: [id number]  CC: Medical Management of Chronic Issues   HPI [name] presents for CPE. He had a fall last week. He still is having pain in he medial aspect of the right foot. He also twisted his lower back. This is better, but his right lower back remains sore.   Depression screen [facility] 2/9 02/21/2019 09/03/2018 07/23/2018  Decreased Interest 0 0 0  Down, Depressed, Hopeless 0 0 0  PHQ - 2 Score 0 0 0    History Kani has a past medical history of Bradycardia, CAD in native artery, Foot drop, right foot, History of GI bleed, History of mitral valve prolapse, History of peptic ulcer disease, Hyperlipidemia, mixed, Hypertension, MI (myocardial infarction) ([place]) (2000, 2004), and Neuromuscular disorder ([place]).   He has a past surgical history that includes Coronary stent placement (2004, 2007); LEFT HEART CATH AND CORONARY ANGIOGRAPHY (N/A, 07/18/2018); Coronary/Graft Acute MI Revascularization (N/A, 07/18/2018); CORONARY STENT INTERVENTION (N/A, 07/18/2018); and CORONARY STENT INTERVENTION (N/A, 07/20/2018).   His family history includes Cancer in his daughter; Coronary artery disease in an other family member; Heart attack in his father and mother.He reports that he quit smoking about 42 years ago. He has never used smokeless tobacco. He reports that he does not drink alcohol or use drugs.    ROS Review of Systems  Constitutional: Negative for activity change, fatigue and unexpected weight change.  HENT: Positive for ear pain (feel full). Negative for congestion, hearing loss, postnasal drip and trouble swallowing.   Eyes: Negative for pain and visual disturbance.  Respiratory: Positive for apnea and shortness of breath (occasional DOE). Negative for cough and chest tightness.   Cardiovascular: Negative for chest pain, palpitations and leg swelling.  Gastrointestinal: Negative for abdominal distention,  abdominal pain, blood in stool, constipation, diarrhea, nausea and vomiting.  Endocrine: Negative for cold intolerance, heat intolerance and polydipsia.  Genitourinary: Negative for difficulty urinating, dysuria, flank pain, frequency and urgency.  Musculoskeletal: Positive for arthralgias (right foot) and back pain. Negative for joint swelling.  Skin: Negative for color change, rash and wound.  Neurological: Negative for dizziness, syncope, speech difficulty, weakness, light-headedness, numbness and headaches.  Hematological: Does not bruise/bleed easily.  Psychiatric/Behavioral: Negative for confusion, decreased concentration, dysphoric mood and sleep disturbance. The patient is not nervous/anxious.     Objective:  BP 119/73   Pulse (!) 55   Temp 98 F (36.7 C) (Temporal)   Resp 20   Ht _0  (1.88 m)   Wt 205 lb (93 kg)   SpO2 98%   BMI 26.32 kg/m   BP Readings from Last 3 Encounters:  02/21/19 119/73  07/26/18 109/75  07/21/18 (!) 134/93    Wt Readings from Last 3 Encounters:  02/21/19 205 lb (93 kg)  12/07/18 196 lb (88.9 kg)  09/27/18 196 lb (88.9 kg)     Physical Exam Constitutional:      General: He is not in acute distress.    Appearance: He is well-developed.  HENT:     Head: Normocephalic and atraumatic.     Right Ear: External ear normal. There is impacted cerumen.     Left Ear: External ear normal.     Nose: Nose normal.     Mouth/Throat:     Mouth: Mucous membranes are moist.  Eyes:     Conjunctiva/sclera: Conjunctivae normal.     Pupils: Pupils are equal, round,  and reactive to light.  Neck:     Musculoskeletal: Normal range of motion and neck supple.  Cardiovascular:     Rate and Rhythm: Normal rate and regular rhythm.     Heart sounds: Normal heart sounds. No murmur.  Pulmonary:     Effort: Pulmonary effort is normal. No respiratory distress.     Breath sounds: Normal breath sounds. No wheezing or rales.  Abdominal:     Palpations: Abdomen is  soft.     Tenderness: There is no abdominal tenderness.  Musculoskeletal: Normal range of motion.  Skin:    General: Skin is warm and dry.  Neurological:     Mental Status: He is alert and oriented to person, place, and time.     Deep Tendon Reflexes: Reflexes are normal and symmetric.  Psychiatric:        Behavior: Behavior normal.        Thought Content: Thought content normal.        Judgment: Judgment normal.       Assessment & Plan:   Doryan was seen today for medical management of chronic issues.  Diagnoses and all orders for this visit:  Well adult exam -     CBC with Differential/Platelet -     CMP14+EGFR -     Lipid panel -     PSA Total (Reflex To Free) -     Urinalysis -     VITAMIN D 25 Hydroxy (Vit-D Deficiency, Fractures)  ASCVD (arteriosclerotic cardiovascular disease) -     CBC with Differential/Platelet -     CMP14+EGFR  Essential hypertension, benign -     CBC with Differential/Platelet -     CMP14+EGFR -     Urinalysis  Hyperlipidemia with target LDL less than 70 -     CBC with Differential/Platelet -     CMP14+EGFR -     Lipid panel  DOE (dyspnea on exertion) -     DG Chest 2 View; Future -     CBC with Differential/Platelet -     CMP14+EGFR  Screening for prostate cancer -     CBC with Differential/Platelet -     CMP14+EGFR -     PSA Total (Reflex To Free)  Vitamin D deficiency -     CBC with Differential/Platelet -     CMP14+EGFR -     VITAMIN D 25 Hydroxy (Vit-D Deficiency, Fractures)  Contusion of right foot, initial encounter -     DG Foot Complete Right; Future  Contusion of right hip, initial encounter -     DG HIP UNILAT W OR W/O PELVIS 2-3 VIEWS RIGHT; Future       I am having Delyle R. Pott maintain his aspirin, metoprolol succinate, nitroGLYCERIN, rosuvastatin, furosemide, and clopidogrel.  Allergies as of 02/21/2019      Reactions   Brilinta [ticagrelor] Shortness Of Breath   Ibuprofen Nausea Only       Medication List       Accurate as of February 21, 2019 11:59 PM. If you have any questions, ask your nurse or doctor.        aspirin 81 MG EC tablet Take 1 tablet (81 mg total) by mouth daily.   clopidogrel 75 MG tablet Commonly known as: PLAVIX Take 1 tablet by mouth once daily   furosemide 20 MG tablet Commonly known as: LASIX TAKE 20 MG DAILY AS NEEDED FOR WEIGHT OVER 195LBS OR SWELLING   metoprolol succinate 25 MG 24 hr tablet  Commonly known as: TOPROL-XL Take 1 tablet (25 mg total) by mouth daily.   nitroGLYCERIN 0.4 MG SL tablet Commonly known as: NITROSTAT Place 1 tablet (0.4 mg total) under the tongue every 5 (five) minutes as needed for chest pain.   rosuvastatin 40 MG tablet Commonly known as: CRESTOR Take 1 tablet (40 mg total) by mouth daily at 6 PM.        Follow-up: Return in about 6 months (around 08/22/2019).  Claretta Fraise, M.D.

## 2019-02-22 ENCOUNTER — Other Ambulatory Visit: Payer: Medicare HMO

## 2019-02-22 ENCOUNTER — Other Ambulatory Visit: Payer: Self-pay

## 2019-02-22 DIAGNOSIS — Z125 Encounter for screening for malignant neoplasm of prostate: Secondary | ICD-10-CM | POA: Diagnosis not present

## 2019-02-22 DIAGNOSIS — I10 Essential (primary) hypertension: Secondary | ICD-10-CM | POA: Diagnosis not present

## 2019-02-22 DIAGNOSIS — R0600 Dyspnea, unspecified: Secondary | ICD-10-CM | POA: Diagnosis not present

## 2019-02-22 DIAGNOSIS — E559 Vitamin D deficiency, unspecified: Secondary | ICD-10-CM | POA: Diagnosis not present

## 2019-02-22 DIAGNOSIS — I2510 Atherosclerotic heart disease of native coronary artery without angina pectoris: Secondary | ICD-10-CM | POA: Diagnosis not present

## 2019-02-22 DIAGNOSIS — E785 Hyperlipidemia, unspecified: Secondary | ICD-10-CM | POA: Diagnosis not present

## 2019-02-22 DIAGNOSIS — Z0000 Encounter for general adult medical examination without abnormal findings: Secondary | ICD-10-CM | POA: Diagnosis not present

## 2019-02-22 LAB — URINALYSIS
Bilirubin, UA: NEGATIVE
Glucose, UA: NEGATIVE
Ketones, UA: NEGATIVE
Leukocytes,UA: NEGATIVE
Nitrite, UA: NEGATIVE
RBC, UA: NEGATIVE
Specific Gravity, UA: 1.025 (ref 1.005–1.030)
Urobilinogen, Ur: 1 mg/dL (ref 0.2–1.0)
pH, UA: 6.5 (ref 5.0–7.5)

## 2019-02-23 LAB — LIPID PANEL
Chol/HDL Ratio: 3.3 ratio (ref 0.0–5.0)
Cholesterol, Total: 167 mg/dL (ref 100–199)
HDL: 51 mg/dL (ref >39)
LDL Chol Calc (NIH): 99 mg/dL (ref 0–99)
Triglycerides: 94 mg/dL (ref 0–149)
VLDL Cholesterol Cal: 17 mg/dL (ref 5–40)

## 2019-02-23 LAB — CMP14+EGFR
ALT: 11 IU/L (ref 0–44)
AST: 16 IU/L (ref 0–40)
Albumin/Globulin Ratio: 1.8 (ref 1.2–2.2)
Albumin: 4 g/dL (ref 3.7–4.7)
Alkaline Phosphatase: 124 IU/L — ABNORMAL HIGH (ref 39–117)
BUN/Creatinine Ratio: 12 (ref 10–24)
BUN: 16 mg/dL (ref 8–27)
Bilirubin Total: 1.1 mg/dL (ref 0.0–1.2)
CO2: 23 mmol/L (ref 20–29)
Calcium: 9 mg/dL (ref 8.6–10.2)
Chloride: 103 mmol/L (ref 96–106)
Creatinine, Ser: 1.37 mg/dL — ABNORMAL HIGH (ref 0.76–1.27)
GFR calc Af Amer: 57 mL/min/{1.73_m2} — ABNORMAL LOW (ref >59)
GFR calc non Af Amer: 49 mL/min/{1.73_m2} — ABNORMAL LOW (ref >59)
Globulin, Total: 2.2 g/dL (ref 1.5–4.5)
Glucose: 101 mg/dL — ABNORMAL HIGH (ref 65–99)
Potassium: 4 mmol/L (ref 3.5–5.2)
Sodium: 142 mmol/L (ref 134–144)
Total Protein: 6.2 g/dL (ref 6.0–8.5)

## 2019-02-23 LAB — CBC WITH DIFFERENTIAL/PLATELET
Basophils Absolute: 0.1 10*3/uL (ref 0.0–0.2)
Basos: 1 %
EOS (ABSOLUTE): 0.2 10*3/uL (ref 0.0–0.4)
Eos: 3 %
Hematocrit: 42.4 % (ref 37.5–51.0)
Hemoglobin: 14.6 g/dL (ref 13.0–17.7)
Immature Grans (Abs): 0 10*3/uL (ref 0.0–0.1)
Immature Granulocytes: 0 %
Lymphocytes Absolute: 1.8 10*3/uL (ref 0.7–3.1)
Lymphs: 32 %
MCH: 31.1 pg (ref 26.6–33.0)
MCHC: 34.4 g/dL (ref 31.5–35.7)
MCV: 90 fL (ref 79–97)
Monocytes Absolute: 0.5 10*3/uL (ref 0.1–0.9)
Monocytes: 9 %
Neutrophils Absolute: 3.1 10*3/uL (ref 1.4–7.0)
Neutrophils: 55 %
Platelets: 215 10*3/uL (ref 150–450)
RBC: 4.7 x10E6/uL (ref 4.14–5.80)
RDW: 12.8 % (ref 11.6–15.4)
WBC: 5.6 10*3/uL (ref 3.4–10.8)

## 2019-02-23 LAB — VITAMIN D 25 HYDROXY (VIT D DEFICIENCY, FRACTURES): Vit D, 25-Hydroxy: 28.7 ng/mL — ABNORMAL LOW (ref 30.0–100.0)

## 2019-02-23 LAB — PSA TOTAL (REFLEX TO FREE): Prostate Specific Ag, Serum: 1.2 ng/mL (ref 0.0–4.0)

## 2019-02-25 ENCOUNTER — Other Ambulatory Visit: Payer: Self-pay | Admitting: *Deleted

## 2019-02-25 MED ORDER — VITAMIN D (ERGOCALCIFEROL) 1.25 MG (50000 UNIT) PO CAPS: 50000.0000 [IU] | ORAL_CAPSULE | ORAL | 1 refills | Status: DC

## 2019-02-25 NOTE — Progress Notes (Signed)
Your chest x-ray looked normal. Thanks, WS.

## 2019-03-22 ENCOUNTER — Other Ambulatory Visit: Payer: Self-pay | Admitting: *Deleted

## 2019-03-22 ENCOUNTER — Encounter: Payer: Self-pay | Admitting: *Deleted

## 2019-03-22 NOTE — Patient Outreach (Signed)
[facility]) Care Management  03/22/2019  ARDEAN ELKO [birth year] [id number]   [place] Quarterly Outreach  Referral Date:07/25/2018 Referral Source:MD Referral Screening Reason for Referral:Disease Management Education Insurance:[employer] Medicare   Outreach Attempt:  Successful telephone outreach to patient for follow up.  HIPAA verified with patient.  Patient stating he is doing well.  Reports he is healing from his fall in October.  Continues to weigh daily.  Weight this morning was 201 pounds.  States he takes his Lasix at times when his weight is above 200 pounds.  Discussed with patient Lasix orders to take when weight over 195 pounds and he stated his understanding..  Does state he gets short of breath at times but it is much better than when he was in the hospital in March.  Denies any lower extremity edema, except in his right foot that is chronic from his foot drop.  Appointments:  Patient attended appointment with primary care provider, Dr. Livia Snellen on 02/21/2019.  Needs to schedule Cardiology follow up.  Plan: RN Health Coach will make next telephone outreach to patient within the month of January.  [place] [phone] [email or web address]

## 2019-04-27 ENCOUNTER — Other Ambulatory Visit: Payer: Self-pay | Admitting: Cardiology

## 2019-04-29 ENCOUNTER — Other Ambulatory Visit: Payer: Self-pay | Admitting: Cardiology

## 2019-05-16 ENCOUNTER — Other Ambulatory Visit: Payer: Self-pay | Admitting: *Deleted

## 2019-05-16 ENCOUNTER — Encounter: Payer: Self-pay | Admitting: *Deleted

## 2019-05-16 NOTE — Patient Outreach (Signed)
[facility]) Care Management  [facility] Care Manager  05/16/2019   [name] 1940-11-24 [id number]   [place] Quarterly Outreach   Referral Date:  07/25/2018 Referral Source:  MD Referral Screening Reason for Referral:  Disease Management Education Insurance:  [employer] Medicare   Outreach Attempt: Successful telephone outreach to patient for follow up.  HIPAA verified with patient.  Patient reporting he is doing well.  Denies any recent sick days.  Denies any lower extremity edema and reports his shortness of breath is better. Does report he has not been weighing himself daily but he has been taking his Lasix every day.  Discussed importance of daily weight monitoring.  Patient also reports frequent falls at home without injury.  Fall precautions and preventions reviewed and discussed.  Encouraged patient to use cane with ambulation to help stabilize gait.  Encounter Medications:  Outpatient Encounter Medications as of 05/16/2019  Medication Sig Note  . aspirin EC 81 MG EC tablet Take 1 tablet (81 mg total) by mouth daily.   . clopidogrel (PLAVIX) 75 MG tablet Take 1 tablet by mouth once daily   . furosemide (LASIX) 20 MG tablet TAKE 20 MG DAILY AS NEEDED FOR WEIGHT OVER 195LBS OR SWELLING 05/16/2019: Reports taking daily  . metoprolol succinate (TOPROL-XL) 25 MG 24 hr tablet Take 1 tablet (25 mg total) by mouth daily.   . nitroGLYCERIN (NITROSTAT) 0.4 MG SL tablet Place 1 tablet (0.4 mg total) under the tongue every 5 (five) minutes as needed for chest pain.   . rosuvastatin (CRESTOR) 40 MG tablet Take 1 tablet (40 mg total) by mouth daily at 6 PM.   . Vitamin D, Ergocalciferol, (DRISDOL) 1.25 MG (50000 UT) CAPS capsule Take 1 capsule (50,000 Units total) by mouth every 7 (seven) days.    No facility-administered encounter medications on file as of 05/16/2019.    Functional Status:  In your present state of health, do you have any difficulty performing the following  activities: 09/03/2018 07/23/2018  Hearing? Y -  Comment difficulties hearing but no hearing aids at this time -  Vision? Y -  Comment vision still blurry with reading glasses, has not had eye exam in about 60 years -  Difficulty concentrating or making decisions? Y -  Comment trouble remembering things -  Walking or climbing stairs? N -  Dressing or bathing? N -  Doing errands, shopping? N -  Preparing Food and eating ? N Y  Comment - some difficulty now due to recent MI and stent placement  Using the Toilet? N N  In the past six months, have you accidently leaked urine? N N  Do you have problems with loss of bowel control? N N  Managing your Medications? N N  Managing your Finances? N N  Housekeeping or managing your Housekeeping? N Y  Comment - weakness due to current medical state  Some recent data might be hidden    Fall/Depression Screening: Fall Risk  05/16/2019 03/22/2019 02/21/2019  Falls in the past year? '1 1 1  ' Comment - - -  Number falls in past yr: 1 1 0  Comment - Fall in October 2020 -  Injury with Fall? 0 0 0  Comment - - -  Risk Factor Category  - - -  Risk for fall due to : History of fall(s);Medication side effect;Impaired balance/gait;Impaired mobility;Impaired vision;Orthopedic patient History of fall(s);Medication side effect;Impaired balance/gait -  Follow up Falls evaluation completed;Education provided;Falls prevention discussed Falls evaluation  completed;Education provided;Falls prevention discussed -   PHQ 2/9 Scores 02/21/2019 09/03/2018 07/23/2018 02/12/2018 08/11/2017 07/18/2017 02/09/2017  PHQ - 2 Score 0 0 0 0 0 0 0   [facility] CM Care Plan Problem One     Most Recent Value  Care Plan Problem One  Knowledge deficiet related to self care management of congestive heart failure.  Role Documenting the Problem One  [name] for Problem One  Active  [facility] Long Term Goal   Patient will schedule follow up appointment with cardiologist within the next 60  days.  [facility] Long Term Goal Start Date  05/16/19  [facility] Long Term Goal Met Date  05/16/19  Interventions for Problem One Long Term Goal  Goals and care plan reviewed and discussed with patient, reviewed medications and indications and encouraged medication compliance, reviewed signs and symptoms of heart failure and heart failure zones with action plan, reviewed last cardiology note with patient and request for 6 week follow up and encouraged patient to schedule follow up appointment, fall precautions and preventions reviewed and discussed, encouraged to use cane with ambulation to her stabilize gait  [facility] CM Short Term Goal #1   Patient will report weighing himself daily in the next 60 days.  [facility] CM Short Term Goal #1 Start Date  05/16/19  Interventions for Short Term Goal #1  Discussed importance of daily weight monitoring, encouraged patient to weigh daily, discussed when to call provider based on weight, reviewed when to take fluid medication based on weight per providers recommendations, encouraged patient to discuss weight and daily weights with provider  [facility] CM Short Term Goal #2         Appointments:  Attended appointment with Dr. Livia Snellen on 02/21/2019 and has scheduled follow up appointment on 08/22/2019.  Now has scheduled appointment with Dr. Harl Bowie on 06/24/2019.  Plan: RN Health Coach will send patient 2021 Calendar Booklet. RN Health Coach will send primary care quarterly update. RN Health Coach will make next telephone outreach to patient within the month of March and patient agrees to future outreach.  [place] [phone] [email or web address]

## 2019-06-24 ENCOUNTER — Ambulatory Visit (INDEPENDENT_AMBULATORY_CARE_PROVIDER_SITE_OTHER): Payer: Medicare HMO | Admitting: Cardiology

## 2019-06-24 ENCOUNTER — Encounter: Payer: Self-pay | Admitting: Cardiology

## 2019-06-24 ENCOUNTER — Other Ambulatory Visit: Payer: Self-pay

## 2019-06-24 VITALS — BP 123/76 | HR 51 | Ht 74.5 in | Wt 202.6 lb

## 2019-06-24 DIAGNOSIS — I5032 Chronic diastolic (congestive) heart failure: Secondary | ICD-10-CM

## 2019-06-24 DIAGNOSIS — E782 Mixed hyperlipidemia: Secondary | ICD-10-CM | POA: Diagnosis not present

## 2019-06-24 DIAGNOSIS — I2510 Atherosclerotic heart disease of native coronary artery without angina pectoris: Secondary | ICD-10-CM

## 2019-06-24 DIAGNOSIS — I10 Essential (primary) hypertension: Secondary | ICD-10-CM | POA: Diagnosis not present

## 2019-06-24 MED ORDER — EZETIMIBE 10 MG PO TABS: 10.0000 mg | ORAL_TABLET | Freq: Every day | ORAL | 1 refills | Status: DC

## 2019-06-24 NOTE — Patient Instructions (Signed)
Your physician wants you to follow-up in: [place] will receive a reminder letter in the mail two months in advance. If you don't receive a letter, please call our office to schedule the follow-up appointment.  Your physician has recommended you make the following change in your medication:   START ZETIA 10 MG DAILY   Thank you for choosing [facility]!!

## 2019-06-24 NOTE — Progress Notes (Signed)
Clinical Summary Mr. Krukowski is a 79 y.o.male seen today for a focused follow up for history of CAD and recent SOB.    1. CAD - multiple PCIs in the past - hx of MI in 2004, with stenting to RCA (ostium down to PDA). Of note had GI bleed 2 months later on DAPT and NSAIDs requiring transfusion.  - cath from 2007 with occluded LCX OM, received a BMS. RCA stents were patent at that time. LVEF 70% by LV gram at that time, no echoes in our system   - admit 07/2018 with acute posterior STEMI, taken to cath lab - 07/2018 cath showed ostal LAD 60%, mid LAD 99%, D1 95%, OM1 occluded, RCA 30%, mid RCA 95%.Received DES to OM1, then staged PCI to RCA.From notes no options on treating LAD, diffusely diseased and no target for CABG. Recs for indefinitie DAPT. LVEDP 29, limited diuresis due to uptrend in Cr.   -  changed from brillinta to plavix due to SOB. Interventional has recommended indefinitive DAPT - no recent chest pain - can have some SOB at times. Takes lasix prn with improvement, did have high LVEDP at last cath   2. Chronic diastolic HF - normally home weights 195 to 200 lbs - takes lasix prn if weight above 195 lbs.  - overall symptoms controlled   3. HTN - he is compliant with meds  4. Hyperlipidemia -02/2019 TC 167 TG 94 HDL 51 LDL 99 - compliant with cresto 40mg  daily  5. Chronic bradycardia - chronic and asymptomatic.   Past Medical History:  Diagnosis Date  . Bradycardia   . CAD in native artery   . Foot drop, right foot   . History of GI bleed   . History of mitral valve prolapse   . History of peptic ulcer disease   . Hyperlipidemia, mixed   . Hypertension   . MI (myocardial infarction) ([place]) 2000, 2004  . Neuromuscular disorder ([facility])      Allergies  Allergen Reactions  . Brilinta [Ticagrelor] Shortness Of Breath  . Ibuprofen Nausea Only     Current Outpatient Medications  Medication Sig Dispense Refill  . aspirin EC 81 MG EC tablet Take 1  tablet (81 mg total) by mouth daily.    . clopidogrel (PLAVIX) 75 MG tablet Take 1 tablet by mouth once daily 90 tablet 0  . furosemide (LASIX) 20 MG tablet TAKE 20 MG DAILY AS NEEDED FOR WEIGHT OVER 195LBS OR SWELLING 30 tablet 3  . metoprolol succinate (TOPROL-XL) 25 MG 24 hr tablet Take 1 tablet (25 mg total) by mouth daily. 90 tablet 3  . nitroGLYCERIN (NITROSTAT) 0.4 MG SL tablet Place 1 tablet (0.4 mg total) under the tongue every 5 (five) minutes as needed for chest pain. 25 tablet 3  . rosuvastatin (CRESTOR) 40 MG tablet Take 1 tablet (40 mg total) by mouth daily at 6 PM. 90 tablet 3  . Vitamin D, Ergocalciferol, (DRISDOL) 1.25 MG (50000 UT) CAPS capsule Take 1 capsule (50,000 Units total) by mouth every 7 (seven) days. 13 capsule 1   No current facility-administered medications for this visit.     Past Surgical History:  Procedure Laterality Date  . CORONARY STENT INTERVENTION N/A 07/18/2018   Procedure: CORONARY STENT INTERVENTION;  Surgeon: Martinique, Peter M, MD;  Location: [facility];  Service: Cardiovascular;  Laterality: N/A;  . CORONARY STENT INTERVENTION N/A 07/20/2018   Procedure: CORONARY STENT INTERVENTION;  Surgeon: Martinique, Peter M, MD;  Location: [facility];  Service: Cardiovascular;  Laterality: N/A;  . CORONARY STENT PLACEMENT  2004, 2007   5 in 2004, 1 in 2007  . CORONARY/GRAFT ACUTE MI REVASCULARIZATION N/A 07/18/2018   Procedure: Coronary/Graft Acute MI Revascularization;  Surgeon: Martinique, Peter M, MD;  Location: [facility];  Service: Cardiovascular;  Laterality: N/A;  . LEFT HEART CATH AND CORONARY ANGIOGRAPHY N/A 07/18/2018   Procedure: LEFT HEART CATH AND CORONARY ANGIOGRAPHY;  Surgeon: Martinique, Peter M, MD;  Location: [facility];  Service: Cardiovascular;  Laterality: N/A;     Allergies  Allergen Reactions  . Brilinta [Ticagrelor] Shortness Of Breath  . Ibuprofen Nausea Only      Family History  Problem Relation Age of Onset    . Heart attack Mother   . Heart attack Father   . Coronary artery disease Other   . Cancer Daughter      Social History Mr. Maish reports that he quit smoking about 43 years ago. He has never used smokeless tobacco. Mr. Smelley reports no history of alcohol use.   Review of Systems CONSTITUTIONAL: No weight loss, fever, chills, weakness or fatigue.  HEENT: Eyes: No visual loss, blurred vision, double vision or yellow sclerae.No hearing loss, sneezing, congestion, runny nose or sore throat.  SKIN: No rash or itching.  CARDIOVASCULAR: per hpi RESPIRATORY: No shortness of breath, cough or sputum.  GASTROINTESTINAL: No anorexia, nausea, vomiting or diarrhea. No abdominal pain or blood.  GENITOURINARY: No burning on urination, no polyuria NEUROLOGICAL: No headache, dizziness, syncope, paralysis, ataxia, numbness or tingling in the extremities. No change in bowel or bladder control.  MUSCULOSKELETAL: No muscle, back pain, joint pain or stiffness.  LYMPHATICS: No enlarged nodes. No history of splenectomy.  PSYCHIATRIC: No history of depression or anxiety.  ENDOCRINOLOGIC: No reports of sweating, cold or heat intolerance. No polyuria or polydipsia.  [name]   Physical Examination Today's Vitals   06/24/19 1539  BP: 123/76  Pulse: (!) 51  Weight: 202 lb 9.6 oz (91.9 kg)  Height: 6' 2.5" (1.892 m)   Body mass index is 25.66 kg/m.  Gen: resting comfortably, no acute distress HEENT: no scleral icterus, pupils equal round and reactive, no palptable cervical adenopathy,  CV: RRR, no m/r/g ,no jvd Resp: Clear to auscultation bilaterally GI: abdomen is soft, non-tender, non-distended, normal bowel sounds, no hepatosplenomegaly MSK: extremities are warm, no edema.  Skin: warm, no rash Neuro:  no focal deficits Psych: appropriate affect   Diagnostic Studies Cath 2007 Central aortic pressure is 101/68 with a mean of 84. LV pressure is 94/1  with an EDP of 6. There is no aortic stenosis.   Left main was normal.  LAD was a large vessel coursing to the apex. There was very prominent  severe ectasia in the proximal portion with aneurysmal dilatation and  swirling flow. However, no high grade stenosis. In the mid LAD, there was  a diffuse 40-50% segmental plaquing. In the distal LAD, there was a tubular  70-80% stenosis. There also were two diagonals. The first diagonal is a  medium vessel with a long 90% stenosis. The second diagonal was small with  branching vessel with a 70-80% proximal lesion.  The left circumflex was a moderate sized system. It gave off a large  branching OM-1 and a small OM-2. There is a 40% proximal lesion in the  circumflex. The lower Kristeen Lantz of the OM was totally occluded and the distal  part of the vessel filled  through collaterals with TIMI I flow. There is a  40% blockage in the upper portion of the OM.  The right coronary artery was a large dominant vessel. It gave off a  moderate sized PDA and two small PLs. There was evidence of previously  placed stent from the ostial portion down to the PDA. There was 40% ostial  stenosis with damping of the 6-French catheter. This did not change with  sublingual nitroglycerin. There is a 30% lesion in the proximal to mid  portion.  Left ventriculogram done the RAO position showed an EF of 70% with mild  mitral valve prolapse. There was no wall motion abnormalities and there was  no mitral regurgitation. Left ventriculogram done in the LAO position  showed an EF of 70% with no regional wall motion abnormalities.  Abdominal aortogram showed patent renal arteries bilaterally with no  abdominal aortic aneurysm.  A left subclavian done for possible bypass surgery showed a patent LIMA to  the chest wall with no obstructions in the subclavian.  ASSESSMENT:  1. Three vessel native coronary artery disease.  2. The LAD has diffuse ectasia and swelling flow proximally as described   above. There is a borderline lesion in the distal LAD and a high grade  disease in the diagonal, but these do not appear to be the culprit  lesion.  3. Totally occluded OM1 which appears to be the culprit.  4. Patent right coronary stents.  5. Normal LV function with mild mitral valve prolapse but no significant  mitral regurgitation.  CONCLUSION: Successful PCI of a totally occluded circumflex marginal vessel  with improvement in narrowing from 100% to 0% and improvement in flow from  TIMI 0 to TIMI III flow using a bare metal stent.   07/2018 echo IMPRESSIONS   1. The left ventricle has low normal systolic function, with an ejection fraction of 50-55%. The cavity size was normal. Left ventricular diastolic Doppler parameters are indeterminate. 2. Mild hypokinesis of the left ventricular, mid-apical inferoseptal wall, inferior segment and inferolateral wall. 3. The right ventricle has normal systolic function. The cavity was normal. There is no increase in right ventricular wall thickness. 4. The aortic valve is tricuspid Mild thickening of the aortic valve Mild calcification of the aortic valve. Aortic valve regurgitation was not assessed by color flow Doppler.   07/2018 cath  Previously placed Mid RCA to Dist RCA stent (unknown type) is widely patent.  Ost RCA to Mid RCA lesion is 30% stenosed.  Mid RCA lesion is 95% stenosed.  Ost 1st Mrg to 1st Mrg lesion is 100% stenosed.  Ost LAD to Prox LAD lesion is 60% stenosed.  Prox LAD lesion is 25% stenosed.  Ost 1st Diag to 1st Diag lesion is 95% stenosed.  Mid LAD to Dist LAD lesion is 99% stenosed.  Post intervention, there is a 0% residual stenosis.  A drug-eluting stent was successfully placed using a STENT SYNERGY DES 3X24.  LV end diastolic pressure is moderately elevated.  1. Severe 3 vessel obstructive CAD - Severe aneurysmal disease in the proximal LAD. Diffuse 99% stenosis in the mid to  distal LAD. 95% bifurcating first diagonal.  - 100% occlusion of the large first OM at site of prior stent. This is the culprit lesion - 95% mid RCA in stent 2. Moderately elevated LVEDP 3. Successful PCI of the OM 1 with DES x1  Discussed with Dr. Gwenlyn Found. There are no options for treating the LAD. It is diffusely diseased and  there is no target for CABG. There are some right to left collaterals to the distal LAD. We proceeded with emergent PCI of the OM1. Recommend DAPT indefinitely given multiple layers of stent. Plan to bring patient back in 2 days for PCI of the RCA. Will treat the LAD medically. Check LV function by Echo. Maximize medical therapy. Stress importance of medication compliance.   07/2018 staged PCI  Mid RCA lesion is 95% stenosed.  A drug-eluting stent was successfully placed using a STENT SYNERGY DES 3.5X16.  Post intervention, there is a 0% residual stenosis.  1. Successful PCI of the mid RCA with DES x 1.  Plan: continue DAPT indefinitely. Anticipate DC home in am.    Assessment and Plan   1. CAD -no recent symptoms, he is committed to indefinite DAPT as reported above - continue current meds  2. Chronic diastolic HF - controlled, continue prn lasix.   3. HTN - at goal, continue current meds  4. Hyperlipidemia - given his extensive CAD history would try to get LDL <70 - he is on most potent statin with cresto 40mg  daily and is compliant - start zetia 10mg  daily.    F/u 6 months   Arnoldo Lenis, M.D.

## 2019-07-22 ENCOUNTER — Other Ambulatory Visit: Payer: Self-pay | Admitting: *Deleted

## 2019-07-22 DIAGNOSIS — Z0289 Encounter for other administrative examinations: Secondary | ICD-10-CM

## 2019-07-22 NOTE — Patient Outreach (Signed)
[facility]) Care Management  07/22/2019  GRADIE TRUMPY 1941/01/10 [id number]   [place] Quarterly Outreach  Referral Date:07/25/2018 Referral Source:MD Referral Screening Reason for Referral:Disease Management Education Insurance:[employer]   Outreach Attempt:  Unsuccessful 1st attempt at quarterly follow up.  Unable to leave voice message due to line being busy.  Plan:  RN Health Coach will make another outreach attempt within the month of April.  [place] [phone] Leone [email or web address]

## 2019-07-24 ENCOUNTER — Ambulatory Visit: Payer: Medicare HMO | Admitting: *Deleted

## 2019-07-24 DIAGNOSIS — Z0000 Encounter for general adult medical examination without abnormal findings: Secondary | ICD-10-CM | POA: Diagnosis not present

## 2019-07-24 NOTE — Patient Instructions (Signed)

## 2019-07-24 NOTE — Progress Notes (Signed)
MEDICARE ANNUAL WELLNESS VISIT  07/24/2019  Telephone Visit Disclaimer This Medicare AWV was conducted by telephone due to national recommendations for restrictions regarding the COVID-19 Pandemic (e.g. social distancing).  I verified, using two identifiers, that I am speaking with [name] or their authorized healthcare agent. I discussed the limitations, risks, security, and privacy concerns of performing an evaluation and management service by telephone and the potential availability of an in-person appointment in the future. The patient expressed understanding and agreed to proceed.   Subjective:  [name] is a 79 y.o. male patient of Stacks, Cletus Gash, MD who had a Medicare Annual Wellness Visit today via telephone. Othal is Retired and lives with their spouse. he has no living children but had 2 daughters who have passed away. he reports that he is socially active and does interact with friends/family regularly. he is minimally physically active and enjoys fishing, building bird houses, reading the [place], listening to [place], fishing and being outdoors.  Patient Care Team: Claretta Fraise, MD as PCP - General (Family Medicine) Harl Bowie Alphonse Guild, MD as PCP - Cardiology (Cardiology) Ilean China, RN as Case Manager Delice Lesch, Lezlie Octave, MD as Consulting Physician (Neurology) Leona Singleton, RN as [place] Management  Advanced Directives 07/24/2019 09/03/2018 07/23/2018 07/18/2018 07/18/2017  Does Patient Have a Medical Advance Directive? No No No No No  Would patient like information on creating a medical advance directive? No - Patient declined No - Patient declined No - Patient declined No - Patient declined No - Patient declined    Hospital Utilization Over the Past 12 Months: # of hospitalizations or ER visits: 0 # of surgeries: 0  Review of Systems    Patient reports that his overall health is worse compared to last year.  History obtained  from chart review  Patient Reported Readings (BP, Pulse, CBG, Weight, etc) none  Pain Assessment Pain : No/denies pain     Current Medications & Allergies (verified) Allergies as of 07/24/2019      Reactions   Brilinta [ticagrelor] Shortness Of Breath   Ibuprofen Nausea Only      Medication List       Accurate as of July 24, 2019 12:13 PM. If you have any questions, ask your nurse or doctor.        aspirin 81 MG EC tablet Take 1 tablet (81 mg total) by mouth daily.   clopidogrel 75 MG tablet Commonly known as: PLAVIX Take 1 tablet by mouth once daily   ezetimibe 10 MG tablet Commonly known as: ZETIA Take 1 tablet (10 mg total) by mouth daily.   furosemide 20 MG tablet Commonly known as: LASIX TAKE 20 MG DAILY AS NEEDED FOR WEIGHT OVER 195LBS OR SWELLING   metoprolol succinate 25 MG 24 hr tablet Commonly known as: TOPROL-XL Take 1 tablet (25 mg total) by mouth daily.   nitroGLYCERIN 0.4 MG SL tablet Commonly known as: NITROSTAT Place 1 tablet (0.4 mg total) under the tongue every 5 (five) minutes as needed for chest pain.   rosuvastatin 40 MG tablet Commonly known as: CRESTOR Take 1 tablet (40 mg total) by mouth daily at 6 PM.   Vitamin D (Ergocalciferol) 1.25 MG (50000 UNIT) Caps capsule Commonly known as: DRISDOL Take 1 capsule (50,000 Units total) by mouth every 7 (seven) days.       History (reviewed): Past Medical History:  Diagnosis Date  . Bradycardia   . CAD in native artery   .  Foot drop, right foot   . History of GI bleed   . History of mitral valve prolapse   . History of peptic ulcer disease   . Hyperlipidemia, mixed   . Hypertension   . MI (myocardial infarction) ([place]) 2000, 2004  . Neuromuscular disorder [facility])    Past Surgical History:  Procedure Laterality Date  . CORONARY STENT INTERVENTION N/A 07/18/2018   Procedure: CORONARY STENT INTERVENTION;  Surgeon: Martinique, Peter M, MD;  Location: [place] [facility];  Service:  Cardiovascular;  Laterality: N/A;  . CORONARY STENT INTERVENTION N/A 07/20/2018   Procedure: CORONARY STENT INTERVENTION;  Surgeon: Martinique, Peter M, MD;  Location: [place] [facility];  Service: Cardiovascular;  Laterality: N/A;  . CORONARY STENT PLACEMENT  2004, 2007   5 in 2004, 1 in 2007  . CORONARY/GRAFT ACUTE MI REVASCULARIZATION N/A 07/18/2018   Procedure: Coronary/Graft Acute MI Revascularization;  Surgeon: Martinique, Peter M, MD;  Location: [place] [facility];  Service: Cardiovascular;  Laterality: N/A;  . LEFT HEART CATH AND CORONARY ANGIOGRAPHY N/A 07/18/2018   Procedure: LEFT HEART CATH AND CORONARY ANGIOGRAPHY;  Surgeon: Martinique, Peter M, MD;  Location: [place] [facility];  Service: Cardiovascular;  Laterality: N/A;   Family History  Problem Relation Age of Onset  . Heart attack Mother   . Heart attack Father   . Coronary artery disease Other   . Cancer Daughter    Social History   Socioeconomic History  . Marital status: Married    Spouse name: Mary  . Number of children: 2  . Years of education: 9  . Highest education level: GED or equivalent  Occupational History  . Occupation: Retired in 2006  Tobacco Use  . Smoking status: Former Smoker    Packs/day: 3.00    Years: 20.00    Pack years: 60.00    Types: Cigarettes    Quit date: 05/09/1976    Years since quitting: 43.2  . Smokeless tobacco: Never Used  Substance and Sexual Activity  . Alcohol use: No  . Drug use: No  . Sexual activity: Not Currently    Partners: Female  Other Topics Concern  . Not on file  Social History Narrative   Married and lives at home with his wife.   He has two children.    He is retired but stays active at home.   Right handed   Social Determinants of Health   Financial Resource Strain: Low Risk   . Difficulty of Paying Living Expenses: Not hard at all  Food Insecurity: No Food Insecurity  . Worried About Charity fundraiser in the Last Year: Never true  . Ran Out of Food in the  Last Year: Never true  Transportation Needs: No Transportation Needs  . Lack of Transportation (Medical): No  . Lack of Transportation (Non-Medical): No  Physical Activity: Inactive  . Days of Exercise per Week: 0 days  . Minutes of Exercise per Session: 0 min  Stress: No Stress Concern Present  . Feeling of Stress : Only a little  Social Connections: Not Isolated  . Frequency of Communication with Friends and Family: More than three times a week  . Frequency of Social Gatherings with Friends and Family: More than three times a week  . Attends Religious Services: More than 4 times per year  . Active Member of Clubs or Organizations: Yes  . Attends Archivist Meetings: More than 4 times per year  . Marital Status: Married  Activities of Daily Living In your present state of health, do you have any difficulty performing the following activities: 07/24/2019 09/03/2018  Hearing? Tempie Donning  Comment says he is hard of hearing difficulties hearing but no hearing aids at this time  Vision? N Y  Comment wears readers for fine print-hasn't had an eye exam in years-encouraged to schedule appt vision still blurry with reading glasses, has not had eye exam in about 60 years  Difficulty concentrating or making decisions? Y Y  Comment has noticed his memory isn't as good as it used to be trouble remembering things  Walking or climbing stairs? Y N  Comment due to right foot drop -  Dressing or bathing? N N  Doing errands, shopping? N N  Preparing Food and eating ? N N  Using the Toilet? N N  In the past six months, have you accidently leaked urine? N N  Do you have problems with loss of bowel control? N N  Managing your Medications? N N  Managing your Finances? N N  Housekeeping or managing your Housekeeping? N N  Some recent data might be hidden    Patient Education/ Literacy How often do you need to have someone help you when you read instructions, pamphlets, or other written materials  from your doctor or pharmacy?: 1 - Never What is the last grade level you completed in school?: GED  Exercise Current Exercise Habits: The patient does not participate in regular exercise at present, Exercise limited by: cardiac condition(s);orthopedic condition(s);respiratory conditions(s)  Diet Patient reports consuming 1 meals a day and 3 snack(s) a day Patient reports that his primary diet is: Regular Patient reports that she does have regular access to food.   Depression Screen PHQ 2/9 Scores 07/24/2019 02/21/2019 09/03/2018 07/23/2018 02/12/2018 08/11/2017 07/18/2017  PHQ - 2 Score 0 0 0 0 0 0 0     Fall Risk Fall Risk  07/24/2019 05/16/2019 03/22/2019 02/21/2019 02/14/2019  Falls in the past year? 1 1 1 1 1   Comment - - - - -  Number falls in past yr: 1 1 1  0 1  Comment - - Fall in October 2020 - fall beginning of 10/20  Injury with Fall? 1 0 0 0 0  Comment - - - - -  Risk Factor Category  - - - - -  Risk for fall due to : Impaired balance/gait History of fall(s);Medication side effect;Impaired balance/gait;Impaired mobility;Impaired vision;Orthopedic patient History of fall(s);Medication side effect;Impaired balance/gait - History of fall(s);Medication side effect;Impaired mobility;Impaired balance/gait  Follow up - Falls evaluation completed;Education provided;Falls prevention discussed Falls evaluation completed;Education provided;Falls prevention discussed - Education provided;Falls evaluation completed;Falls prevention discussed     Objective:  [name] seemed alert and oriented and he participated appropriately during our telephone visit.  Blood Pressure Weight BMI  BP Readings from Last 3 Encounters:  06/24/19 123/76  02/21/19 119/73  07/26/18 109/75   Wt Readings from Last 3 Encounters:  06/24/19 202 lb 9.6 oz (91.9 kg)  02/21/19 205 lb (93 kg)  12/07/18 196 lb (88.9 kg)   BMI Readings from Last 1 Encounters:  06/24/19 25.66 kg/m    *Unable to obtain current  vital signs, weight, and BMI due to telephone visit type  Hearing/Vision  . Oluwafemi did not seem to have difficulty with hearing/understanding during the telephone conversation . Reports that he has not had a formal eye exam by an eye care professional within the past year . Reports that he has not  had a formal hearing evaluation within the past year *Unable to fully assess hearing and vision during telephone visit type  Cognitive Function: 6CIT Screen 07/24/2019  What Year? 0 points  What month? 0 points  What time? 0 points  Count back from 20 0 points  Months in reverse 0 points  Repeat phrase 0 points  Total Score 0   (Normal:0-7, Significant for Dysfunction: >8)  Normal Cognitive Function Screening: Yes   Immunization & Health Maintenance Record Immunization History  Administered Date(s) Administered  . Influenza, High Dose Seasonal PF 02/09/2017, 01/24/2019  . Pneumococcal Conjugate-13 07/23/2014  . Pneumococcal Polysaccharide-23 07/30/2011    Health Maintenance  Topic Date Due  . TETANUS/TDAP  02/21/2020 (Originally 08/26/1959)  . INFLUENZA VACCINE  Completed  . PNA vac Low Risk Adult  Completed       Assessment  This is a routine wellness examination for [name].  Health Maintenance: Due or Overdue There are no preventive care reminders to display for this patient.  [name] does not need a referral for Community Assistance: Care Management:   no Social Work:    no Prescription Assistance:  no Nutrition/Diabetes Education:  no   Plan:  Personalized Goals Goals Addressed            This Visit's Progress   . DIET - INCREASE WATER INTAKE       Try to drink 6-8 glasses of water daily      Personalized Health Maintenance & Screening Recommendations  Td vaccine Shingrix vaccine  Lung Cancer Screening Recommended: no (Low Dose CT Chest recommended if Age 9-80 years, 30 pack-year currently smoking OR have quit w/in past 15 years) Hepatitis  C Screening recommended: no HIV Screening recommended: no  Advanced Directives: Written information was not prepared per patient's request.  Referrals & Orders No orders of the defined types were placed in this encounter.   Follow-up Plan . Follow-up with Claretta Fraise, MD as planned . Schedule an eye exam as discussed . Consider TDAP and Shingrix vaccines at your next visit with your PCP   I have personally reviewed and noted the following in the patient's chart:   . Medical and social history . Use of alcohol, tobacco or illicit drugs  . Current medications and supplements . Functional ability and status . Nutritional status . Physical activity . Advanced directives . List of other physicians . Hospitalizations, surgeries, and ER visits in previous 12 months . Vitals . Screenings to include cognitive, depression, and falls . Referrals and appointments  In addition, I have reviewed and discussed with [name] certain preventive protocols, quality metrics, and best practice recommendations. A written personalized care plan for preventive services as well as general preventive health recommendations is available and can be mailed to the patient at his request.      Milas Hock, LPN  [phone]

## 2019-07-25 ENCOUNTER — Other Ambulatory Visit: Payer: Self-pay | Admitting: *Deleted

## 2019-07-26 ENCOUNTER — Other Ambulatory Visit: Payer: Self-pay | Admitting: Student

## 2019-07-26 ENCOUNTER — Other Ambulatory Visit: Payer: Self-pay | Admitting: *Deleted

## 2019-07-26 MED ORDER — VITAMIN D (ERGOCALCIFEROL) 1.25 MG (50000 UNIT) PO CAPS: 50000.0000 [IU] | ORAL_CAPSULE | ORAL | 1 refills | Status: DC

## 2019-07-26 NOTE — Telephone Encounter (Signed)
Patient is requesting a refill on vitamin d. Please advise

## 2019-07-26 NOTE — Telephone Encounter (Signed)
This is a [place] pt.  °

## 2019-07-30 DIAGNOSIS — G629 Polyneuropathy, unspecified: Secondary | ICD-10-CM | POA: Diagnosis not present

## 2019-07-30 DIAGNOSIS — Z008 Encounter for other general examination: Secondary | ICD-10-CM | POA: Diagnosis not present

## 2019-07-30 DIAGNOSIS — E785 Hyperlipidemia, unspecified: Secondary | ICD-10-CM | POA: Diagnosis not present

## 2019-07-30 DIAGNOSIS — R030 Elevated blood-pressure reading, without diagnosis of hypertension: Secondary | ICD-10-CM | POA: Diagnosis not present

## 2019-07-30 DIAGNOSIS — E261 Secondary hyperaldosteronism: Secondary | ICD-10-CM | POA: Diagnosis not present

## 2019-07-30 DIAGNOSIS — Z7982 Long term (current) use of aspirin: Secondary | ICD-10-CM | POA: Diagnosis not present

## 2019-07-30 DIAGNOSIS — M1990 Unspecified osteoarthritis, unspecified site: Secondary | ICD-10-CM | POA: Diagnosis not present

## 2019-07-30 DIAGNOSIS — I509 Heart failure, unspecified: Secondary | ICD-10-CM | POA: Diagnosis not present

## 2019-07-30 DIAGNOSIS — Z7902 Long term (current) use of antithrombotics/antiplatelets: Secondary | ICD-10-CM | POA: Diagnosis not present

## 2019-07-30 DIAGNOSIS — I252 Old myocardial infarction: Secondary | ICD-10-CM | POA: Diagnosis not present

## 2019-07-30 DIAGNOSIS — I25119 Atherosclerotic heart disease of native coronary artery with unspecified angina pectoris: Secondary | ICD-10-CM | POA: Diagnosis not present

## 2019-08-06 ENCOUNTER — Other Ambulatory Visit: Payer: Self-pay | Admitting: Student

## 2019-08-12 ENCOUNTER — Encounter: Payer: Self-pay | Admitting: *Deleted

## 2019-08-12 ENCOUNTER — Other Ambulatory Visit: Payer: Self-pay | Admitting: *Deleted

## 2019-08-12 NOTE — Patient Outreach (Signed)
[facility]) Care Management  [facility] Care Manager  08/12/2019   MECHEL SCHUTTER 1941/04/12 [id number]   [place] Quarterly Outreach   Referral Date:  07/25/2018 Referral Source:  MD Referral Screening Reason for Referral:  Disease Management Education Insurance:  [employer] Medicare    Outreach Attempt:  Successful telephone outreach to patient for follow up.  HIPAA verified with patient.  Patient reporting he has been doing well.  Denies any falls in the past month.  States he does not wish to take COVID vaccine at this time.  Patient education provided and encouraged patient to schedule in the future if he decides to take.  Continues to weigh daily.  Reporting this mornings weight was 197 pounds (within his normal range).  States he takes his Lasix about every day.  Denies any lower extremity edema but admits to ankles swelling when standing for long periods.  Denies any shortness of breath today but states he has periods of shortness of breath that is relieved with resting.  Encounter Medications:  Outpatient Encounter Medications as of 08/12/2019  Medication Sig Note  . aspirin EC 81 MG EC tablet Take 1 tablet (81 mg total) by mouth daily.   . clopidogrel (PLAVIX) 75 MG tablet Take 1 tablet by mouth once daily   . ezetimibe (ZETIA) 10 MG tablet Take 1 tablet (10 mg total) by mouth daily.   . furosemide (LASIX) 20 MG tablet TAKE 20 MG DAILY AS NEEDED FOR WEIGHT OVER 195LBS OR SWELLING 05/16/2019: Reports taking daily  . metoprolol succinate (TOPROL-XL) 25 MG 24 hr tablet Take 1 tablet by mouth once daily   . nitroGLYCERIN (NITROSTAT) 0.4 MG SL tablet Place 1 tablet (0.4 mg total) under the tongue every 5 (five) minutes as needed for chest pain.   . rosuvastatin (CRESTOR) 40 MG tablet TAKE 1 TABLET BY MOUTH ONCE DAILY 6 IN THE EVENING   . Vitamin D, Ergocalciferol, (DRISDOL) 1.25 MG (50000 UNIT) CAPS capsule Take 1 capsule (50,000 Units total) by mouth every 7 (seven) days.     No facility-administered encounter medications on file as of 08/12/2019.    Functional Status:  In your present state of health, do you have any difficulty performing the following activities: 07/24/2019 09/03/2018  Hearing? Tempie Donning  Comment says he is hard of hearing difficulties hearing but no hearing aids at this time  Vision? N Y  Comment wears readers for fine print-hasn't had an eye exam in years-encouraged to schedule appt vision still blurry with reading glasses, has not had eye exam in about 60 years  Difficulty concentrating or making decisions? Y Y  Comment has noticed his memory isn't as good as it used to be trouble remembering things  Walking or climbing stairs? Y N  Comment due to right foot drop -  Dressing or bathing? N N  Doing errands, shopping? N N  Preparing Food and eating ? N N  Using the Toilet? N N  In the past six months, have you accidently leaked urine? N N  Do you have problems with loss of bowel control? N N  Managing your Medications? N N  Managing your Finances? N N  Housekeeping or managing your Housekeeping? N N  Some recent data might be hidden    Fall/Depression Screening: Fall Risk  08/12/2019 07/24/2019 05/16/2019  Falls in the past year? '1 1 1  ' Comment - - -  Number falls in past yr: '1 1 1  ' Comment - - -  Injury with Fall? 0 1 0  Comment - - -  Risk Factor Category  - - -  Risk for fall due to : Medication side effect;Impaired balance/gait;History of fall(s);Impaired mobility Impaired balance/gait History of fall(s);Medication side effect;Impaired balance/gait;Impaired mobility;Impaired vision;Orthopedic patient  Follow up Falls evaluation completed;Education provided;Falls prevention discussed - Falls evaluation completed;Education provided;Falls prevention discussed   PHQ 2/9 Scores 08/12/2019 07/24/2019 02/21/2019 09/03/2018 07/23/2018 02/12/2018 08/11/2017  PHQ - 2 Score 0 0 0 0 0 0 0   [facility] CM Care Plan Problem One     Most Recent Value  Care Plan  Problem One  Knowledge deficiet related to self care management of congestive heart failure.  Role Documenting the Problem One  [place] for Problem One  Active  [facility] Long Term Goal   Patient will report no emergency room visits within the next 90 days.  [facility] Long Term Goal Start Date  08/12/19  [facility] Long Term Goal Met Date  08/12/19  Interventions for Problem One Long Term Goal  Goals and care plan reviewed and discussed, encouraged to continue to weight daily and discussed when to call provider based on weight, reviewed signs and symptoms of heart failure, reviewed medications and encouraged medication compliance, encouraged patient to schedule eye exam, encouraged to keep and attend scheduled medical appointments  [facility] CM Short Term Goal #2         Appointments:  Attended appointment for Annual Wellness Visit on 07/24/2019 and has scheduled follow up with primary care provider, Dr. Livia Snellen on 08/22/2019.  Attended appointment with Cardiologist, Dr. Harl Bowie on 06/24/2019.  Plan: RN Health Coach will send primary care provider quarterly update. RN Health Coach will make next telephone outreach to patient within the month of July and patient agrees to future outreach.  [place] [phone] [email or web address]

## 2019-08-22 ENCOUNTER — Encounter: Payer: Self-pay | Admitting: Family Medicine

## 2019-08-22 ENCOUNTER — Ambulatory Visit (INDEPENDENT_AMBULATORY_CARE_PROVIDER_SITE_OTHER): Payer: Medicare HMO | Admitting: Family Medicine

## 2019-08-22 ENCOUNTER — Other Ambulatory Visit: Payer: Self-pay

## 2019-08-22 VITALS — BP 118/73 | HR 53 | Temp 97.0°F | Resp 18 | Ht 74.5 in | Wt 206.4 lb

## 2019-08-22 DIAGNOSIS — I10 Essential (primary) hypertension: Secondary | ICD-10-CM | POA: Diagnosis not present

## 2019-08-22 DIAGNOSIS — I209 Angina pectoris, unspecified: Secondary | ICD-10-CM

## 2019-08-22 DIAGNOSIS — I2510 Atherosclerotic heart disease of native coronary artery without angina pectoris: Secondary | ICD-10-CM

## 2019-08-22 DIAGNOSIS — E785 Hyperlipidemia, unspecified: Secondary | ICD-10-CM | POA: Diagnosis not present

## 2019-08-22 LAB — LIPID PANEL
Chol/HDL Ratio: 2.8 ratio (ref 0.0–5.0)
Cholesterol, Total: 142 mg/dL (ref 100–199)
HDL: 51 mg/dL (ref >39)
LDL Chol Calc (NIH): 73 mg/dL (ref 0–99)
Triglycerides: 98 mg/dL (ref 0–149)
VLDL Cholesterol Cal: 18 mg/dL (ref 5–40)

## 2019-08-22 LAB — CMP14+EGFR
ALT: 14 IU/L (ref 0–44)
AST: 19 IU/L (ref 0–40)
Albumin/Globulin Ratio: 2.2 (ref 1.2–2.2)
Albumin: 4.3 g/dL (ref 3.7–4.7)
Alkaline Phosphatase: 101 IU/L (ref 39–117)
BUN/Creatinine Ratio: 16 (ref 10–24)
BUN: 20 mg/dL (ref 8–27)
Bilirubin Total: 1 mg/dL (ref 0.0–1.2)
CO2: 24 mmol/L (ref 20–29)
Calcium: 9.2 mg/dL (ref 8.6–10.2)
Chloride: 106 mmol/L (ref 96–106)
Creatinine, Ser: 1.27 mg/dL (ref 0.76–1.27)
GFR calc Af Amer: 63 mL/min/{1.73_m2} (ref >59)
GFR calc non Af Amer: 54 mL/min/{1.73_m2} — ABNORMAL LOW (ref >59)
Globulin, Total: 2 g/dL (ref 1.5–4.5)
Glucose: 90 mg/dL (ref 65–99)
Potassium: 4.1 mmol/L (ref 3.5–5.2)
Sodium: 143 mmol/L (ref 134–144)
Total Protein: 6.3 g/dL (ref 6.0–8.5)

## 2019-08-22 LAB — CBC WITH DIFFERENTIAL/PLATELET
Basophils Absolute: 0 10*3/uL (ref 0.0–0.2)
Basos: 1 %
EOS (ABSOLUTE): 0.2 10*3/uL (ref 0.0–0.4)
Eos: 3 %
Hematocrit: 43.6 % (ref 37.5–51.0)
Hemoglobin: 15.1 g/dL (ref 13.0–17.7)
Immature Grans (Abs): 0 10*3/uL (ref 0.0–0.1)
Immature Granulocytes: 0 %
Lymphocytes Absolute: 1.3 10*3/uL (ref 0.7–3.1)
Lymphs: 27 %
MCH: 31.3 pg (ref 26.6–33.0)
MCHC: 34.6 g/dL (ref 31.5–35.7)
MCV: 91 fL (ref 79–97)
Monocytes Absolute: 0.4 10*3/uL (ref 0.1–0.9)
Monocytes: 9 %
Neutrophils Absolute: 2.8 10*3/uL (ref 1.4–7.0)
Neutrophils: 60 %
Platelets: 195 10*3/uL (ref 150–450)
RBC: 4.82 x10E6/uL (ref 4.14–5.80)
RDW: 12.2 % (ref 11.6–15.4)
WBC: 4.8 10*3/uL (ref 3.4–10.8)

## 2019-08-22 NOTE — Progress Notes (Signed)
Subjective:  Patient ID: [name], male    DOB: 1940-11-02  Age: 79 y.o. MRN: [id number]  CC: Medical Management of Chronic Issues   HPI [name] presents for  presents for  follow-up of hypertension. Patient has no history of headache chest pain or shortness of breath or recent cough. Patient also denies symptoms of TIA such as focal numbness or weakness. Patient denies side effects from medication. States taking it regularly.   in for follow-up of elevated cholesterol. Doing well without complaints on current medication. Denies side effects of statin including myalgia and arthralgia and nausea. Currently no chest pain, shortness of breath or other cardiovascular related symptoms noted.  Pt. Has been having precordial tightness and pressure. Occurs about once every 10 days or so. Lasts about 4-5 minutes.   Depression screen [facility] 2/9 08/12/2019 07/24/2019 02/21/2019  Decreased Interest 0 0 0  Down, Depressed, Hopeless 0 0 0  PHQ - 2 Score 0 0 0    History Bell has a past medical history of Bradycardia, CAD in native artery, Foot drop, right foot, History of GI bleed, History of mitral valve prolapse, History of peptic ulcer disease, Hyperlipidemia, mixed, Hypertension, MI (myocardial infarction) ([place]) (2000, 2004), and Neuromuscular disorder ([place]).   He has a past surgical history that includes Coronary stent placement (2004, 2007); LEFT HEART CATH AND CORONARY ANGIOGRAPHY (N/A, 07/18/2018); Coronary/Graft Acute MI Revascularization (N/A, 07/18/2018); CORONARY STENT INTERVENTION (N/A, 07/18/2018); and CORONARY STENT INTERVENTION (N/A, 07/20/2018).   His family history includes Cancer in his daughter; Coronary artery disease in an other family member; Heart attack in his father and mother.He reports that he quit smoking about 43 years ago. His smoking use included cigarettes. He has a 60.00 pack-year smoking history. He has never used smokeless tobacco. He reports that he does not drink  alcohol or use drugs.    ROS Review of Systems  Constitutional: Negative.   HENT: Negative.   Eyes: Negative for visual disturbance.  Respiratory: Positive for chest tightness. Negative for cough and shortness of breath.   Cardiovascular: Negative for chest pain and leg swelling.  Gastrointestinal: Negative for abdominal pain, diarrhea, nausea and vomiting.  Genitourinary: Negative for difficulty urinating.  Musculoskeletal: Negative for arthralgias and myalgias.  Skin: Negative for rash.  Neurological: Negative for headaches.  Psychiatric/Behavioral: Negative for sleep disturbance.    Objective:  BP 118/73   Pulse (!) 53   Temp (!) 97 F (36.1 C) (Oral)   Resp 18   Ht 6' 2.5" (1.892 m)   Wt 206 lb 6 oz (93.6 kg)   SpO2 98%   BMI 26.14 kg/m   BP Readings from Last 3 Encounters:  08/22/19 118/73  06/24/19 123/76  02/21/19 119/73    Wt Readings from Last 3 Encounters:  08/22/19 206 lb 6 oz (93.6 kg)  06/24/19 202 lb 9.6 oz (91.9 kg)  02/21/19 205 lb (93 kg)     Physical Exam Vitals reviewed.  Constitutional:      Appearance: He is well-developed.  HENT:     Head: Normocephalic and atraumatic.     Right Ear: External ear normal.     Left Ear: External ear normal.     Mouth/Throat:     Pharynx: No oropharyngeal exudate or posterior oropharyngeal erythema.  Eyes:     Pupils: Pupils are equal, round, and reactive to light.  Cardiovascular:     Rate and Rhythm: Normal rate and regular rhythm.     Heart sounds:  No murmur.  Pulmonary:     Effort: No respiratory distress.     Breath sounds: Normal breath sounds.  Musculoskeletal:     Cervical back: Normal range of motion and neck supple.  Neurological:     Mental Status: He is alert and oriented to person, place, and time.       Assessment & Plan:   Bluford was seen today for medical management of chronic issues.  Diagnoses and all orders for this visit:  Essential hypertension, benign -     CBC with  Differential/Platelet -     CMP14+EGFR -     Lipid panel  Hyperlipidemia with target LDL less than 70 -     CBC with Differential/Platelet -     CMP14+EGFR -     Lipid panel  Arteriosclerotic cardiovascular disease (ASCVD) -     CBC with Differential/Platelet -     CMP14+EGFR -     Lipid panel       I am having Arturo R. Doyle Askew maintain his aspirin, nitroGLYCERIN, furosemide, clopidogrel, ezetimibe, metoprolol succinate, Vitamin D (Ergocalciferol), and rosuvastatin.  Allergies as of 08/22/2019      Reactions   Brilinta [ticagrelor] Shortness Of Breath   Ibuprofen Nausea Only      Medication List       Accurate as of August 22, 2019 11:59 PM. If you have any questions, ask your nurse or doctor.        aspirin 81 MG EC tablet Take 1 tablet (81 mg total) by mouth daily.   clopidogrel 75 MG tablet Commonly known as: PLAVIX Take 1 tablet by mouth once daily   ezetimibe 10 MG tablet Commonly known as: ZETIA Take 1 tablet (10 mg total) by mouth daily.   furosemide 20 MG tablet Commonly known as: LASIX TAKE 20 MG DAILY AS NEEDED FOR WEIGHT OVER 195LBS OR SWELLING   metoprolol succinate 25 MG 24 hr tablet Commonly known as: TOPROL-XL Take 1 tablet by mouth once daily   nitroGLYCERIN 0.4 MG SL tablet Commonly known as: NITROSTAT Place 1 tablet (0.4 mg total) under the tongue every 5 (five) minutes as needed for chest pain.   rosuvastatin 40 MG tablet Commonly known as: CRESTOR TAKE 1 TABLET BY MOUTH ONCE DAILY 6 IN THE EVENING   Vitamin D (Ergocalciferol) 1.25 MG (50000 UNIT) Caps capsule Commonly known as: DRISDOL Take 1 capsule (50,000 Units total) by mouth every 7 (seven) days.        Follow-up: Return in about 6 months (around 02/21/2020), or if symptoms worsen or fail to improve.  Claretta Fraise, M.D.

## 2019-08-24 NOTE — Progress Notes (Signed)
Hello [place],  Your lab result is normal and/or stable.Some minor variations that are not significant are commonly marked abnormal, but do not represent any medical problem for you.  Best regards, Keir Viernes, M.D.

## 2019-08-25 ENCOUNTER — Encounter: Payer: Self-pay | Admitting: Family Medicine

## 2019-08-25 NOTE — Addendum Note (Signed)
Addended by: Claretta Fraise on: 08/25/2019 11:34 PM   Modules accepted: Orders

## 2019-09-02 NOTE — Progress Notes (Signed)
Cardiology Office Note  Date: 09/02/2019   ID: [name], [name] 19-Oct-1940, MRN [id number]  PCP:  Claretta Fraise, MD  Cardiologist:  Carlyle Dolly, MD Electrophysiologist:  None   Chief Complaint: Follow-up CAD, chronic diastolic HF, HTN, HLD, chronic bradycardia  History of Present Illness: [name] is a 79 y.o. male with a history of CAD, chronic diastolic HF, HTN, HLD, chronic bradycardia  MI 2004 w/ stents to RCA with GIB after starting DAPT / NSAIDS requiring transfusion. Cath 2007 occluded Lcx OM w/ BMS. RCA stents remained patent. EF 70% by LV gram. 07/2018 acute posterior STEMI with cath ostial LAD 60%, MLAD 99%, D1 95%, OM 1 occluded, RCA 30%, mRCA 95%. DES to OM1, then staged PCI to RCA. No options for treating LAD. Diffusely diseased and no target for CABG. Recommendations for indefinite DAPT. Limited options for diuresis d/t increasing Crt. Changed from brillinta to plavix d/t SOB.   Last encounter with Dr. Harl Bowie 06/24/2019: No recent CP, occasional SOB. Taking Lasix prn w/ improvement. Had high LVEDP last cath. Chronic Diastolic HF was maintaining wts. around 195-200 lbs. Taking prn lasix if wt. > 195 lbs. Continued chronic bradycardia. Not at goal for LDL. Zetia 10 mg was started. BP at goal, CHF controlled with prn Lasix.  Patient presents today with complaints of minor chest pain in left precordial area.  States it can occur at rest and with activity.  States it is not associated with radiation or other symptoms such as nausea, vomiting, diaphoresis.  States he also has some spontaneous episodes of shortness of breath not associated with activity he denies any associated palpitations or presyncopal/syncopal episode associated.  States sometimes it can occur at night waking him from sleep.  Sometimes he can have it when sitting down resting.  He denies the symptoms being similar to previous episodes when he has had to have stent placement.  He stated those symptoms  consisted of nausea, shortness of breath, and chest pain.  Has previous multiple PCI's and stent placements.  Past Medical History:  Diagnosis Date  . Bradycardia   . CAD in native artery   . Foot drop, right foot   . History of GI bleed   . History of mitral valve prolapse   . History of peptic ulcer disease   . Hyperlipidemia, mixed   . Hypertension   . MI (myocardial infarction) ([place]) 2000, 2004  . Neuromuscular disorder [facility])     Past Surgical History:  Procedure Laterality Date  . CORONARY STENT INTERVENTION N/A 07/18/2018   Procedure: CORONARY STENT INTERVENTION;  Surgeon: Martinique, Peter M, MD;  Location: [place] [facility];  Service: Cardiovascular;  Laterality: N/A;  . CORONARY STENT INTERVENTION N/A 07/20/2018   Procedure: CORONARY STENT INTERVENTION;  Surgeon: Martinique, Peter M, MD;  Location: [place] [facility];  Service: Cardiovascular;  Laterality: N/A;  . CORONARY STENT PLACEMENT  2004, 2007   5 in 2004, 1 in 2007  . CORONARY/GRAFT ACUTE MI REVASCULARIZATION N/A 07/18/2018   Procedure: Coronary/Graft Acute MI Revascularization;  Surgeon: Martinique, Peter M, MD;  Location: [place] [facility];  Service: Cardiovascular;  Laterality: N/A;  . LEFT HEART CATH AND CORONARY ANGIOGRAPHY N/A 07/18/2018   Procedure: LEFT HEART CATH AND CORONARY ANGIOGRAPHY;  Surgeon: Martinique, Peter M, MD;  Location: [place] [facility];  Service: Cardiovascular;  Laterality: N/A;    Current Outpatient Medications  Medication Sig Dispense Refill  . aspirin EC 81 MG EC tablet Take 1  tablet (81 mg total) by mouth daily.    . clopidogrel (PLAVIX) 75 MG tablet Take 1 tablet by mouth once daily 90 tablet 0  . ezetimibe (ZETIA) 10 MG tablet Take 1 tablet (10 mg total) by mouth daily. 90 tablet 1  . furosemide (LASIX) 20 MG tablet TAKE 20 MG DAILY AS NEEDED FOR WEIGHT OVER 195LBS OR SWELLING 30 tablet 3  . metoprolol succinate (TOPROL-XL) 25 MG 24 hr tablet Take 1 tablet by mouth once daily 90 tablet 0  .  nitroGLYCERIN (NITROSTAT) 0.4 MG SL tablet Place 1 tablet (0.4 mg total) under the tongue every 5 (five) minutes as needed for chest pain. 25 tablet 3  . rosuvastatin (CRESTOR) 40 MG tablet TAKE 1 TABLET BY MOUTH ONCE DAILY 6 IN THE EVENING 90 tablet 2  . Vitamin D, Ergocalciferol, (DRISDOL) 1.25 MG (50000 UNIT) CAPS capsule Take 1 capsule (50,000 Units total) by mouth every 7 (seven) days. 13 capsule 1   No current facility-administered medications for this visit.   Allergies:  Brilinta [ticagrelor] and Ibuprofen   Social History: The patient  reports that he quit smoking about 43 years ago. His smoking use included cigarettes. He has a 60.00 pack-year smoking history. He has never used smokeless tobacco. He reports that he does not drink alcohol or use drugs.   Family History: The patient's family history includes Cancer in his daughter; Coronary artery disease in an other family member; Heart attack in his father and mother.   ROS:  Please see the history of present illness. Otherwise, complete review of systems is positive for none.  All other systems are reviewed and negative.   Physical Exam: VS:  There were no vitals taken for this visit., BMI There is no height or weight on file to calculate BMI.  Wt Readings from Last 3 Encounters:  08/22/19 206 lb 6 oz (93.6 kg)  06/24/19 202 lb 9.6 oz (91.9 kg)  02/21/19 205 lb (93 kg)    General: Patient appears comfortable at rest. Neck: Supple, no elevated JVP or carotid bruits, no thyromegaly. Lungs: Clear to auscultation, nonlabored breathing at rest. Cardiac: Regular rate and rhythm, no S3 or significant systolic murmur, no pericardial rub. Extremities: No pitting edema, distal pulses 2+. Skin: Warm and dry. Musculoskeletal: No kyphosis. Neuropsychiatric: Alert and oriented x3, affect grossly appropriate.  ECG:  An ECG dated 09/03/2019 was personally reviewed today and demonstrated:  Sinus bradycardia first-degree AV block with  premature atrial complexes, abnormal QRS/T angle consider primary T wave abnormality.  Heart rate of 58  Recent Labwork: 08/22/2019: ALT 14; AST 19; BUN 20; Creatinine, Ser 1.27; Hemoglobin 15.1; Platelets 195; Potassium 4.1; Sodium 143     Component Value Date/Time   CHOL 142 08/22/2019 1021   TRIG 98 08/22/2019 1021   TRIG 119 06/13/2014 1229   TRIG 233 (HH) 05/17/2006 0831   HDL 51 08/22/2019 1021   HDL 47 06/13/2014 1229   CHOLHDL 2.8 08/22/2019 1021   CHOLHDL 4.9 10/11/2010 0828   VLDL 33 10/11/2010 0828   LDLCALC 73 08/22/2019 1021   LDLDIRECT 142.8 01/28/2008 0848    Other Studies Reviewed Today: Cath 2007 Central aortic pressure is 101/68 with a mean of 84. LV pressure is 94/1  with an EDP of 6. There is no aortic stenosis.  Left main was normal.  LAD was a large vessel coursing to the apex. There was very prominent  severe ectasia in the proximal portion with aneurysmal dilatation and  swirling flow.  However, no high grade stenosis. In the mid LAD, there was  a diffuse 40-50% segmental plaquing. In the distal LAD, there was a tubular  70-80% stenosis. There also were two diagonals. The first diagonal is a  medium vessel with a long 90% stenosis. The second diagonal was small with  branching vessel with a 70-80% proximal lesion.  The left circumflex was a moderate sized system. It gave off a large  branching OM-1 and a small OM-2. There is a 40% proximal lesion in the  circumflex. The lower branch of the OM was totally occluded and the distal  part of the vessel filled through collaterals with TIMI I flow. There is a  40% blockage in the upper portion of the OM.  The right coronary artery was a large dominant vessel. It gave off a  moderate sized PDA and two small PLs. There was evidence of previously  placed stent from the ostial portion down to the PDA. There was 40% ostial  stenosis with damping of the 6-French catheter. This did not change with   sublingual nitroglycerin. There is a 30% lesion in the proximal to mid  portion.  Left ventriculogram done the RAO position showed an EF of 70% with mild  mitral valve prolapse. There was no wall motion abnormalities and there was  no mitral regurgitation. Left ventriculogram done in the LAO position  showed an EF of 70% with no regional wall motion abnormalities.  Abdominal aortogram showed patent renal arteries bilaterally with no  abdominal aortic aneurysm.  A left subclavian done for possible bypass surgery showed a patent LIMA to  the chest wall with no obstructions in the subclavian.  ASSESSMENT:  1. Three vessel native coronary artery disease.  2. The LAD has diffuse ectasia and swelling flow proximally as described  above. There is a borderline lesion in the distal LAD and a high grade  disease in the diagonal, but these do not appear to be the culprit  lesion.  3. Totally occluded OM1 which appears to be the culprit.  4. Patent right coronary stents.  5. Normal LV function with mild mitral valve prolapse but no significant  mitral regurgitation.  CONCLUSION: Successful PCI of a totally occluded circumflex marginal vessel  with improvement in narrowing from 100% to 0% and improvement in flow from  TIMI 0 to TIMI III flow using a bare metal stent.   07/2018 echo IMPRESSIONS   1. The left ventricle has low normal systolic function, with an ejection fraction of 50-55%. The cavity size was normal. Left ventricular diastolic Doppler parameters are indeterminate. 2. Mild hypokinesis of the left ventricular, mid-apical inferoseptal wall, inferior segment and inferolateral wall. 3. The right ventricle has normal systolic function. The cavity was normal. There is no increase in right ventricular wall thickness. 4. The aortic valve is tricuspid Mild thickening of the aortic valve Mild calcification of the aortic valve. Aortic valve regurgitation was not  assessed by color flow Doppler.   07/2018 cath  Previously placed Mid RCA to Dist RCA stent (unknown type) is widely patent.  Ost RCA to Mid RCA lesion is 30% stenosed.  Mid RCA lesion is 95% stenosed.  Ost 1st Mrg to 1st Mrg lesion is 100% stenosed.  Ost LAD to Prox LAD lesion is 60% stenosed.  Prox LAD lesion is 25% stenosed.  Ost 1st Diag to 1st Diag lesion is 95% stenosed.  Mid LAD to Dist LAD lesion is 99% stenosed.  Post intervention, there is a  0% residual stenosis.  A drug-eluting stent was successfully placed using a STENT SYNERGY DES 3X24.  LV end diastolic pressure is moderately elevated.  1. Severe 3 vessel obstructive CAD - Severe aneurysmal disease in the proximal LAD. Diffuse 99% stenosis in the mid to distal LAD. 95% bifurcating first diagonal.  - 100% occlusion of the large first OM at site of prior stent. This is the culprit lesion - 95% mid RCA in stent 2. Moderately elevated LVEDP 3. Successful PCI of the OM 1 with DES x1  Discussed with Dr. Gwenlyn Found. There are no options for treating the LAD. It is diffusely diseased and there is no target for CABG. There are some right to left collaterals to the distal LAD. We proceeded with emergent PCI of the OM1. Recommend DAPT indefinitely given multiple layers of stent. Plan to bring patient back in 2 days for PCI of the RCA. Will treat the LAD medically. Check LV function by Echo. Maximize medical therapy. Stress importance of medication compliance.   07/2018 staged PCI  Mid RCA lesion is 95% stenosed.  A drug-eluting stent was successfully placed using a STENT SYNERGY DES 3.5X16.  Post intervention, there is a 0% residual stenosis.  1. Successful PCI of the mid RCA with DES x 1.  Plan: continue DAPT indefinitely. Anticipate DC home in am.    Assessment and Plan:  1. CAD in native artery   2. Chronic diastolic heart failure ([place])   3. Essential hypertension, benign   4. Mixed  hyperlipidemia    1. CAD in native artery Multiple stent interventions (see cath reports above).  He is on chronic DAPT therapy.  States he has occasional left precordial chest pain but symptoms are not like in the past when he required stent placement.  He has reproducible left precordial pain.  He denies any associated radiation, diaphoresis, nausea, vomiting.  Continue aspirin 81 mg daily, Plavix 75 mg daily.  Start Imdur 30 mg p.o. daily.  2. Chronic diastolic heart failure ([place]) Patient has a history of chronic diastolic heart failure.  Recent echo March 2020 showed EF of 50 to 55%.  Left ventricular Doppler parameters were indeterminate.  Complains of intermittent short episodes of shortness of breath not necessarily associated with activity.  Can occur at rest.  Continue Lasix 20 mg p.o. twice daily.  Continue Toprol-XL 25 mg daily.  3. Essential hypertension, benign Well-controlled on current therapy.  Continue Toprol-XL 25 mg daily.   4. Mixed hyperlipidemia Recent lipid levels on 08/22/2019 showed; TC 142, TG 98, HDL 51, LDL 73.  Continue Crestor 40 mg  daily and Zetia 10 mg daily.   5.  GERD symptoms. Patient states he has been having some acid reflux-like symptoms which are bad and seem to be associated with eating the wrong things such as pizza, chocolate etc.  Start pantoprazole 40 mg daily.  Medication Adjustments/Labs and Tests Ordered: Current medicines are reviewed at length with the patient today.  Concerns regarding medicines are outlined above.   Disposition: Follow-up with Dr Harl Bowie or APP 3 months Signed, Levell July, NP 09/02/2019 10:05 PM    [facility] [address], [place], [place] [address] Phone: [phone]; Fax: [phone]

## 2019-09-03 ENCOUNTER — Other Ambulatory Visit: Payer: Self-pay

## 2019-09-03 ENCOUNTER — Ambulatory Visit (INDEPENDENT_AMBULATORY_CARE_PROVIDER_SITE_OTHER): Payer: Medicare HMO | Admitting: Family Medicine

## 2019-09-03 ENCOUNTER — Encounter: Payer: Self-pay | Admitting: Family Medicine

## 2019-09-03 VITALS — BP 118/78 | HR 49 | Ht 74.5 in | Wt 207.0 lb

## 2019-09-03 DIAGNOSIS — I5032 Chronic diastolic (congestive) heart failure: Secondary | ICD-10-CM

## 2019-09-03 DIAGNOSIS — K219 Gastro-esophageal reflux disease without esophagitis: Secondary | ICD-10-CM | POA: Diagnosis not present

## 2019-09-03 DIAGNOSIS — I2510 Atherosclerotic heart disease of native coronary artery without angina pectoris: Secondary | ICD-10-CM

## 2019-09-03 DIAGNOSIS — I10 Essential (primary) hypertension: Secondary | ICD-10-CM | POA: Diagnosis not present

## 2019-09-03 DIAGNOSIS — E782 Mixed hyperlipidemia: Secondary | ICD-10-CM

## 2019-09-03 MED ORDER — PANTOPRAZOLE SODIUM 40 MG PO TBEC: 40.0000 mg | DELAYED_RELEASE_TABLET | Freq: Every day | ORAL | 2 refills | Status: DC

## 2019-09-03 MED ORDER — ISOSORBIDE MONONITRATE ER 30 MG PO TB24: 30.0000 mg | ORAL_TABLET | Freq: Every day | ORAL | 2 refills | Status: DC

## 2019-09-03 NOTE — Patient Instructions (Addendum)
Medication Instructions:   Your physician has recommended you make the following change in your medication:   Start isosorbide mononitrate (imdur) 30 mg by mouth daily  Start pantoprazole (protonix) 40 mg by mouth daily  Continue other medications the same  Labwork:  NONE  Testing/Procedures:  NONE  Follow-Up:  Your physician recommends that you schedule a follow-up appointment in: 3 months (office).  Any Other Special Instructions Will Be Listed Below (If Applicable).  If you need a refill on your cardiac medications before your next appointment, please call your pharmacy.

## 2019-10-05 ENCOUNTER — Other Ambulatory Visit: Payer: Self-pay | Admitting: Cardiology

## 2019-10-11 ENCOUNTER — Ambulatory Visit (INDEPENDENT_AMBULATORY_CARE_PROVIDER_SITE_OTHER): Payer: Medicare HMO | Admitting: Family

## 2019-10-11 ENCOUNTER — Encounter: Payer: Self-pay | Admitting: Family

## 2019-10-11 ENCOUNTER — Other Ambulatory Visit: Payer: Self-pay

## 2019-10-11 VITALS — BP 97/62 | HR 58 | Temp 97.0°F | Ht 74.5 in | Wt 207.4 lb

## 2019-10-11 DIAGNOSIS — L03032 Cellulitis of left toe: Secondary | ICD-10-CM

## 2019-10-11 MED ORDER — CEPHALEXIN 500 MG PO CAPS: 500.0000 mg | ORAL_CAPSULE | Freq: Three times a day (TID) | ORAL | 0 refills | Status: DC

## 2019-10-11 NOTE — Progress Notes (Signed)
   Subjective:    Patient ID: [name], male    DOB: Apr 26, 1941, 79 y.o.   MRN: [id number]  Chief Complaint  Patient presents with  . Toe Pain    left great toenail problem    HPI PT presents to the office today with left great toe swelling. He states he got it "caught" on the couch two weeks ago. He has mild tenderness with palpation of 7 out 10. He has been soaking it every day with mild relief. He reports the redness started 3-4 days.   He reports the area is bleeding, but denies any purulent discharge. He is not a diabetic.    Review of Systems  Skin:       Left great toe swelling   All other systems reviewed and are negative.      Objective:   Physical Exam Vitals reviewed.  Constitutional:      General: He is not in acute distress.    Appearance: He is well-developed.  HENT:     Head: Normocephalic.  Eyes:     General:        Right eye: No discharge.        Left eye: No discharge.     Pupils: Pupils are equal, round, and reactive to light.  Neck:     Thyroid: No thyromegaly.  Cardiovascular:     Rate and Rhythm: Normal rate and regular rhythm.     Heart sounds: Murmur present.  Pulmonary:     Effort: Pulmonary effort is normal. No respiratory distress.     Breath sounds: Normal breath sounds. No wheezing.  Abdominal:     General: Bowel sounds are normal. There is no distension.     Palpations: Abdomen is soft.     Tenderness: There is no abdominal tenderness.  Musculoskeletal:        General: No tenderness. Normal range of motion.     Cervical back: Normal range of motion and neck supple.  Skin:    General: Skin is warm and dry.     Findings: No erythema or rash.     Comments: Left great toe swelling, erythemas, mild tenderness,   Neurological:     Mental Status: He is alert and oriented to person, place, and time.     Cranial Nerves: No cranial nerve deficit.     Deep Tendon Reflexes: Reflexes are normal and symmetric.  Psychiatric:      Behavior: Behavior normal.        Thought Content: Thought content normal.        Judgment: Judgment normal.      BP 97/62   Pulse (!) 58   Temp (!) 97 F (36.1 C)   Ht 6' 2.5" (1.892 m)   Wt 207 lb 6.4 oz (94.1 kg)   SpO2 97%   BMI 26.27 kg/m       Assessment & Plan:  [name] comes in today with chief complaint of Toe Pain (left great toenail problem)   Diagnosis and orders addressed:  1. Paronychia of great toe, left Continue to soak foot Do not pick at nail Start Keflex, report increase in swelling, tenderness, fever, or pain. Given number for podiatry if he worsens to have toenail removed.  - cephALEXin (KEFLEX) 500 MG capsule; Take 1 capsule (500 mg total) by mouth 3 (three) times daily.  Dispense: 21 capsule; Refill: 0  Evelina Dun, FNP

## 2019-10-11 NOTE — Patient Instructions (Signed)
Paronychia Paronychia is an infection of the skin that surrounds a nail. It usually affects the skin around a fingernail, but it may also occur near a toenail. It often causes pain and swelling around the nail. In some cases, a collection of pus (abscess) can form near or under the nail.  This condition may develop suddenly, or it may develop gradually over a longer period. In most cases, paronychia is not serious, and it will clear up with treatment. What are the causes? This condition may be caused by bacteria or a fungus. These germs can enter the body through an opening in the skin, such as a cut or a hangnail. What increases the risk? This condition is more likely to develop in people who:  Get their hands wet often, such as those who work as Designer, industrial/product, bartenders, or nurses.  Bite their fingernails or suck their thumbs.  Trim their nails very short.  Have hangnails or injured fingertips.  Get manicures.  Have diabetes. What are the signs or symptoms? Symptoms of this condition include:  Redness and swelling of the skin near the nail.  Tenderness around the nail when you touch the area.  Pus-filled bumps under the skin at the base and sides of the nail (cuticle).  Fluid or pus under the nail.  Throbbing pain in the area. How is this diagnosed? This condition is diagnosed with a physical exam. In some cases, a sample of pus may be tested to determine what type of bacteria or fungus is causing the condition. How is this treated? Treatment depends on the cause and severity of your condition. If your condition is mild, it may clear up on its own in a few days or after soaking in warm water. If needed, treatment may include:  Antibiotic medicine, if your infection is caused by bacteria.  Antifungal medicine, if your infection is caused by a fungus.  A procedure to drain pus from an abscess.  Anti-inflammatory medicine (corticosteroids). Follow these instructions at  home: Wound care  Keep the affected area clean.  Soak the affected area in warm water, if told to do so by your health care provider. You may be told to do this for 20 minutes, 2-3 times a day.  Keep the area dry when you are not soaking it.  Do not try to drain an abscess yourself.  Follow instructions from your health care provider about how to take care of the affected area. Make sure you: ? Wash your hands with soap and water before you change your bandage (dressing). If soap and water are not available, use hand sanitizer. ? Change your dressing as told by your health care provider.  If you had an abscess drained, check the area every day for signs of infection. Check for: ? Redness, swelling, or pain. ? Fluid or blood. ? Warmth. ? Pus or a bad smell. Medicines   Take over-the-counter and prescription medicines only as told by your health care provider.  If you were prescribed an antibiotic medicine, take it as told by your health care provider. Do not stop taking the antibiotic even if you start to feel better. General instructions  Avoid contact with harsh chemicals.  Do not pick at the affected area. Prevention  To prevent this condition from happening again: ? Wear rubber gloves when washing dishes or doing other tasks that require your hands to get wet. ? Wear gloves if your hands might come in contact with cleaners or other chemicals. ? Avoid  injuring your nails or fingertips. ? Do not bite your nails or tear hangnails. ? Do not cut your nails very short. ? Do not cut your cuticles. ? Use clean nail clippers or scissors when trimming nails. Contact a health care provider if:  Your symptoms get worse or do not improve with treatment.  You have continued or increased fluid, blood, or pus coming from the affected area.  Your finger or knuckle becomes swollen or difficult to move. Get help right away if you have:  A fever or chills.  Redness spreading away  from the affected area.  Joint or muscle pain. Summary  Paronychia is an infection of the skin that surrounds a nail. It often causes pain and swelling around the nail. In some cases, a collection of pus (abscess) can form near or under the nail.  This condition may be caused by bacteria or a fungus. These germs can enter the body through an opening in the skin, such as a cut or a hangnail.  If your condition is mild, it may clear up on its own in a few days. If needed, treatment may include medicine or a procedure to drain pus from an abscess.  To prevent this condition from happening again, wear gloves if doing tasks that require your hands to get wet or to come in contact with chemicals. Also avoid injuring your nails or fingertips. This information is not intended to replace advice given to you by your health care provider. Make sure you discuss any questions you have with your health care provider. Document Revised: 05/12/2017 Document Reviewed: 05/08/2017 Elsevier Patient Education  2020 [employer]  

## 2019-10-21 ENCOUNTER — Telehealth: Payer: Self-pay | Admitting: Family Medicine

## 2019-10-21 NOTE — Telephone Encounter (Signed)
Patient aware that there should be a refill at pharmacy

## 2019-10-26 ENCOUNTER — Other Ambulatory Visit: Payer: Self-pay | Admitting: Cardiology

## 2019-11-07 ENCOUNTER — Other Ambulatory Visit: Payer: Self-pay | Admitting: *Deleted

## 2019-11-07 NOTE — Patient Outreach (Signed)
[facility]) Care Management  11/07/2019  [name] [birth year] [id number]   [name] Quarterly Outreach  Referral Date:07/25/2018 Referral Source:MD Referral Screening Reason for Referral:Disease Management Education Insurance:[employer] Medicare   Outreach Attempt:  Outreach attempt #1 to patient for quarterly follow up.  Male answered and stated patient was not home.  HIPAA compliant message left.  Plan:  RN Health Coach will make another outreach attempt within the month of August if no return call from patient.   [place] [phone] [name]

## 2019-12-03 ENCOUNTER — Encounter: Payer: Self-pay | Admitting: Cardiology

## 2019-12-03 ENCOUNTER — Other Ambulatory Visit: Payer: Self-pay | Admitting: Family Medicine

## 2019-12-03 ENCOUNTER — Ambulatory Visit (INDEPENDENT_AMBULATORY_CARE_PROVIDER_SITE_OTHER): Payer: Medicare HMO | Admitting: Cardiology

## 2019-12-03 VITALS — BP 101/66 | HR 60 | Ht 74.5 in | Wt 204.0 lb

## 2019-12-03 DIAGNOSIS — I2510 Atherosclerotic heart disease of native coronary artery without angina pectoris: Secondary | ICD-10-CM | POA: Diagnosis not present

## 2019-12-03 DIAGNOSIS — I5032 Chronic diastolic (congestive) heart failure: Secondary | ICD-10-CM

## 2019-12-03 DIAGNOSIS — I10 Essential (primary) hypertension: Secondary | ICD-10-CM | POA: Diagnosis not present

## 2019-12-03 DIAGNOSIS — E782 Mixed hyperlipidemia: Secondary | ICD-10-CM | POA: Diagnosis not present

## 2019-12-03 MED ORDER — FUROSEMIDE 40 MG PO TABS
40.0000 mg | ORAL_TABLET | Freq: Every day | ORAL | 1 refills | Status: DC | PRN
Start: 2019-12-03 — End: 2020-06-19

## 2019-12-03 NOTE — Progress Notes (Signed)
Clinical Summary [name] is a 79 y.o.male seen today for follow up of the following medical problems.   1. CAD - multiple PCIs in the past - hx of MI in 2004, with stenting to RCA (ostium down to PDA). Of note had GI bleed 2 months later on DAPT and NSAIDs requiring transfusion.  - cath from 2007 with occluded LCX OM, received a BMS. RCA stents were patent at that time. LVEF 70% by LV gram at that time, no echoes in our system   - admit 07/2018 with acute posterior STEMI, taken to cath lab - 07/2018 cath showed ostal LAD 60%, mid LAD 99%, D1 95%, OM1 occluded, RCA 30%, mid RCA 95%.Received DES to OM1, then staged PCI to RCA.From notes no options on treating LAD, diffusely diseased and no target for CABG. Recs for indefinitie DAPT. LVEDP 29, limited diuresis due to uptrend in Cr.  -  changed from brillinta to plavix due to SOB    - no recent chest pain. Chronic SOB at times that's stable - compliant with meds   2. Chronic diastolic HF - normally home weights 195 to 200 lbs - takes lasix prn if weight above 195 lbs.    - no recent edema - takes lasix prn.   3. HTN - compliant with meds  4. Hyperlipidemia -02/2019 TC 167 TG 94 HDL 51 LDL 99 - compliant with cresto 40mg  daily  08/2019 TC 142 TG 98 HDL 51 LDL 73  5. Chronic bradycardia - chronic, asymptomatic    Past Medical History:  Diagnosis Date  . Bradycardia   . CAD in native artery   . Foot drop, right foot   . History of GI bleed   . History of mitral valve prolapse   . History of peptic ulcer disease   . Hyperlipidemia, mixed   . Hypertension   . MI (myocardial infarction) ([place]) 2000, 2004  . Neuromuscular disorder (HCC)      Allergies  Allergen Reactions  . Brilinta [Ticagrelor] Shortness Of Breath  . Ibuprofen Nausea Only     Current Outpatient Medications  Medication Sig Dispense Refill  . aspirin EC 81 MG EC tablet Take 1 tablet (81 mg total) by mouth daily.    . cephALEXin  (KEFLEX) 500 MG capsule Take 1 capsule (500 mg total) by mouth 3 (three) times daily. 21 capsule 0  . clopidogrel (PLAVIX) 75 MG tablet Take 1 tablet by mouth once daily 90 tablet 1  . furosemide (LASIX) 20 MG tablet Take 1 tablet (20 mg total) by mouth daily as needed (for leg swelling or shortness of breath). 60 tablet 3  . isosorbide mononitrate (IMDUR) 30 MG 24 hr tablet Take 1 tablet by mouth once daily 90 tablet 1  . metoprolol succinate (TOPROL-XL) 25 MG 24 hr tablet Take 1 tablet by mouth once daily 90 tablet 1  . nitroGLYCERIN (NITROSTAT) 0.4 MG SL tablet Place 1 tablet (0.4 mg total) under the tongue every 5 (five) minutes as needed for chest pain. 25 tablet 3  . pantoprazole (PROTONIX) 40 MG tablet Take 1 tablet by mouth once daily 90 tablet 1  . rosuvastatin (CRESTOR) 40 MG tablet TAKE 1 TABLET BY MOUTH ONCE DAILY 6 IN THE EVENING 90 tablet 2  . Vitamin D, Ergocalciferol, (DRISDOL) 1.25 MG (50000 UNIT) CAPS capsule Take 1 capsule (50,000 Units total) by mouth every 7 (seven) days. 13 capsule 1  . ezetimibe (ZETIA) 10 MG tablet Take 1 tablet (10  mg total) by mouth daily. 90 tablet 1   No current facility-administered medications for this visit.     Past Surgical History:  Procedure Laterality Date  . CORONARY STENT INTERVENTION N/A 07/18/2018   Procedure: CORONARY STENT INTERVENTION;  Surgeon: Martinique, Peter M, MD;  Location: [facility];  Service: Cardiovascular;  Laterality: N/A;  . CORONARY STENT INTERVENTION N/A 07/20/2018   Procedure: CORONARY STENT INTERVENTION;  Surgeon: Martinique, Peter M, MD;  Location: [facility];  Service: Cardiovascular;  Laterality: N/A;  . CORONARY STENT PLACEMENT  2004, 2007   5 in 2004, 1 in 2007  . CORONARY/GRAFT ACUTE MI REVASCULARIZATION N/A 07/18/2018   Procedure: Coronary/Graft Acute MI Revascularization;  Surgeon: Martinique, Peter M, MD;  Location: [facility];  Service: Cardiovascular;  Laterality: N/A;  . LEFT HEART CATH AND  CORONARY ANGIOGRAPHY N/A 07/18/2018   Procedure: LEFT HEART CATH AND CORONARY ANGIOGRAPHY;  Surgeon: Martinique, Peter M, MD;  Location: [facility];  Service: Cardiovascular;  Laterality: N/A;     Allergies  Allergen Reactions  . Brilinta [Ticagrelor] Shortness Of Breath  . Ibuprofen Nausea Only      Family History  Problem Relation Age of Onset  . Heart attack Mother   . Heart attack Father   . Coronary artery disease Other   . Cancer Daughter      Social History Mr. Chianese reports that he quit smoking about 43 years ago. His smoking use included cigarettes. He has a 60.00 pack-year smoking history. He has never used smokeless tobacco. Mr. Tuckey reports no history of alcohol use.   Review of Systems CONSTITUTIONAL: No weight loss, fever, chills, weakness or fatigue.  HEENT: Eyes: No visual loss, blurred vision, double vision or yellow sclerae.No hearing loss, sneezing, congestion, runny nose or sore throat.  SKIN: No rash or itching.  CARDIOVASCULAR: per hpi RESPIRATORY: No shortness of breath, cough or sputum.  GASTROINTESTINAL: No anorexia, nausea, vomiting or diarrhea. No abdominal pain or blood.  GENITOURINARY: No burning on urination, no polyuria NEUROLOGICAL: No headache, dizziness, syncope, paralysis, ataxia, numbness or tingling in the extremities. No change in bowel or bladder control.  MUSCULOSKELETAL: No muscle, back pain, joint pain or stiffness.  LYMPHATICS: No enlarged nodes. No history of splenectomy.  PSYCHIATRIC: No history of depression or anxiety.  ENDOCRINOLOGIC: No reports of sweating, cold or heat intolerance. No polyuria or polydipsia.  [name]   Physical Examination Today's Vitals   12/03/19 1516  BP: 101/66  Pulse: 60  Weight: (!) 204 lb (92.5 kg)  Height: 6' 2.5" (1.892 m)   Body mass index is 25.84 kg/m.  Gen: resting comfortably, no acute distress HEENT: no scleral icterus, pupils equal round and reactive, no palptable cervical  adenopathy,  CV: RRR, no m/r/g, no jvd Resp: Clear to auscultation bilaterally GI: abdomen is soft, non-tender, non-distended, normal bowel sounds, no hepatosplenomegaly MSK: extremities are warm, no edema.  Skin: warm, no rash Neuro:  no focal deficits Psych: appropriate affect   Diagnostic Studies   Cath 2007 Central aortic pressure is 101/68 with a mean of 84. LV pressure is 94/1  with an EDP of 6. There is no aortic stenosis.  Left main was normal.  LAD was a large vessel coursing to the apex. There was very prominent  severe ectasia in the proximal portion with aneurysmal dilatation and  swirling flow. However, no high grade stenosis. In the mid LAD, there was  a diffuse 40-50% segmental plaquing. In the  distal LAD, there was a tubular  70-80% stenosis. There also were two diagonals. The first diagonal is a  medium vessel with a long 90% stenosis. The second diagonal was small with  branching vessel with a 70-80% proximal lesion.  The left circumflex was a moderate sized system. It gave off a large  branching OM-1 and a small OM-2. There is a 40% proximal lesion in the  circumflex. The lower Archibald Marchetta of the OM was totally occluded and the distal  part of the vessel filled through collaterals with TIMI I flow. There is a  40% blockage in the upper portion of the OM.  The right coronary artery was a large dominant vessel. It gave off a  moderate sized PDA and two small PLs. There was evidence of previously  placed stent from the ostial portion down to the PDA. There was 40% ostial  stenosis with damping of the 6-French catheter. This did not change with  sublingual nitroglycerin. There is a 30% lesion in the proximal to mid  portion.  Left ventriculogram done the RAO position showed an EF of 70% with mild  mitral valve prolapse. There was no wall motion abnormalities and there was  no mitral regurgitation. Left ventriculogram done in the LAO position   showed an EF of 70% with no regional wall motion abnormalities.  Abdominal aortogram showed patent renal arteries bilaterally with no  abdominal aortic aneurysm.  A left subclavian done for possible bypass surgery showed a patent LIMA to  the chest wall with no obstructions in the subclavian.  ASSESSMENT:  1. Three vessel native coronary artery disease.  2. The LAD has diffuse ectasia and swelling flow proximally as described  above. There is a borderline lesion in the distal LAD and a high grade  disease in the diagonal, but these do not appear to be the culprit  lesion.  3. Totally occluded OM1 which appears to be the culprit.  4. Patent right coronary stents.  5. Normal LV function with mild mitral valve prolapse but no significant  mitral regurgitation.  CONCLUSION: Successful PCI of a totally occluded circumflex marginal vessel  with improvement in narrowing from 100% to 0% and improvement in flow from  TIMI 0 to TIMI III flow using a bare metal stent.   07/2018 echo IMPRESSIONS   1. The left ventricle has low normal systolic function, with an ejection fraction of 50-55%. The cavity size was normal. Left ventricular diastolic Doppler parameters are indeterminate. 2. Mild hypokinesis of the left ventricular, mid-apical inferoseptal wall, inferior segment and inferolateral wall. 3. The right ventricle has normal systolic function. The cavity was normal. There is no increase in right ventricular wall thickness. 4. The aortic valve is tricuspid Mild thickening of the aortic valve Mild calcification of the aortic valve. Aortic valve regurgitation was not assessed by color flow Doppler.   07/2018 cath  Previously placed Mid RCA to Dist RCA stent (unknown type) is widely patent.  Ost RCA to Mid RCA lesion is 30% stenosed.  Mid RCA lesion is 95% stenosed.  Ost 1st Mrg to 1st Mrg lesion is 100% stenosed.  Ost LAD to Prox LAD lesion is 60%  stenosed.  Prox LAD lesion is 25% stenosed.  Ost 1st Diag to 1st Diag lesion is 95% stenosed.  Mid LAD to Dist LAD lesion is 99% stenosed.  Post intervention, there is a 0% residual stenosis.  A drug-eluting stent was successfully placed using a STENT SYNERGY DES 3X24.  LV end  diastolic pressure is moderately elevated.  1. Severe 3 vessel obstructive CAD - Severe aneurysmal disease in the proximal LAD. Diffuse 99% stenosis in the mid to distal LAD. 95% bifurcating first diagonal.  - 100% occlusion of the large first OM at site of prior stent. This is the culprit lesion - 95% mid RCA in stent 2. Moderately elevated LVEDP 3. Successful PCI of the OM 1 with DES x1  Discussed with Dr. Gwenlyn Found. There are no options for treating the LAD. It is diffusely diseased and there is no target for CABG. There are some right to left collaterals to the distal LAD. We proceeded with emergent PCI of the OM1. Recommend DAPT indefinitely given multiple layers of stent. Plan to bring patient back in 2 days for PCI of the RCA. Will treat the LAD medically. Check LV function by Echo. Maximize medical therapy. Stress importance of medication compliance.   07/2018 staged PCI  Mid RCA lesion is 95% stenosed.  A drug-eluting stent was successfully placed using a STENT SYNERGY DES 3.5X16.  Post intervention, there is a 0% residual stenosis.  1. Successful PCI of the mid RCA with DES x 1.  Plan: continue DAPT indefinitely. Anticipate DC home in am.  Assessment and Plan    1. CAD - he is committed to indefinite DAPT as reported above - no symptoms, continue current meds  2. Chronic diastolic HF - doing well, continue current meds  3. HTN - he is at goal, continue current meds  4. Hyperlipidemia - continue current therapy    Arnoldo Lenis, M.D.

## 2019-12-03 NOTE — Patient Instructions (Addendum)
Medication Instructions:    Your physician has recommended you make the following change in your medication:   Increase furosemide to 40 mg by mouth daily as needed for swelling or shortness of breath  Continue other medications the same  Labwork:  NONE  Testing/Procedures:  NONE  Follow-Up:  Your physician recommends that you schedule a follow-up appointment in: 6 months.  Any Other Special Instructions Will Be Listed Below (If Applicable).  If you need a refill on your cardiac medications before your next appointment, please call your pharmacy.

## 2019-12-09 ENCOUNTER — Ambulatory Visit: Payer: Medicare HMO | Admitting: Neurology

## 2019-12-10 ENCOUNTER — Encounter: Payer: Self-pay | Admitting: *Deleted

## 2019-12-10 ENCOUNTER — Other Ambulatory Visit: Payer: Self-pay | Admitting: *Deleted

## 2019-12-10 NOTE — Patient Outreach (Signed)
[facility]) Care Management  12/10/2019  [name] 09-07-1940 [id number]   [place] Quarterly Outreach  Referral Date:07/25/2018 Referral Source:MD Referral Screening Reason for Referral:Disease Management Education Insurance:[employer] Medicare   Outreach Attempt:  Outreach attempt #2 to patient for follow up. No answer and unable to leave voicemail message due to voicemail not engaging.  Plan:  RN Health Coach will make another outreach attempt within the month of September if no return call back from patient.   [place] [phone] [name]

## 2019-12-10 NOTE — Patient Outreach (Signed)
[facility]) Care Management  [facility] Care Manager  12/10/2019   [name] 1941-04-01 [id number]   [place] Quarterly Outreach   Referral Date:  07/25/2018 Referral Source:  MD Referral Screening Reason for Referral:  Disease Management Education Insurance:  [employer]na Medicare   Outreach Attempt:  Received telephone call back from patient.  HIPAA verified with patient.  Patient reporting he is doing well.  States he does have occasional shortness of breath and some lower extremity swelling.  Discussed this with his Cardiologist and his Lasix dose was increased.  Continues to weigh daily.  Weight this morning was 195 pounds.  States his weight usually ranges 195-206 pounds.  Discussed when to weigh daily and when to call provider based on weight.  States he takes Lasix daily.  Denies any recent falls or recent sick days.  Encounter Medications:  Outpatient Encounter Medications as of 12/10/2019  Medication Sig Note  . aspirin EC 81 MG EC tablet Take 1 tablet (81 mg total) by mouth daily.   . clopidogrel (PLAVIX) 75 MG tablet Take 1 tablet by mouth once daily   . furosemide (LASIX) 40 MG tablet Take 1 tablet (40 mg total) by mouth daily as needed (for leg swelling or shortness of breath).   . isosorbide mononitrate (IMDUR) 30 MG 24 hr tablet Take 1 tablet by mouth once daily   . metoprolol succinate (TOPROL-XL) 25 MG 24 hr tablet Take 1 tablet by mouth once daily   . pantoprazole (PROTONIX) 40 MG tablet Take 1 tablet by mouth once daily   . rosuvastatin (CRESTOR) 40 MG tablet TAKE 1 TABLET BY MOUTH ONCE DAILY 6 IN THE EVENING   . ezetimibe (ZETIA) 10 MG tablet Take 1 tablet (10 mg total) by mouth daily. 12/10/2019: Continues to take  . nitroGLYCERIN (NITROSTAT) 0.4 MG SL tablet Place 1 tablet (0.4 mg total) under the tongue every 5 (five) minutes as needed for chest pain.   . Vitamin D, Ergocalciferol, (DRISDOL) 1.25 MG (50000 UNIT) CAPS capsule Take 1 capsule (50,000  Units total) by mouth every 7 (seven) days.    No facility-administered encounter medications on file as of 12/10/2019.    Functional Status:  In your present state of health, do you have any difficulty performing the following activities: 07/24/2019  Hearing? Y  Comment says he is hard of hearing  Vision? N  Comment wears readers for fine print-hasn't had an eye exam in years-encouraged to schedule appt  Difficulty concentrating or making decisions? Y  Comment has noticed his memory isn't as good as it used to be  Walking or climbing stairs? Y  Comment due to right foot drop  Dressing or bathing? N  Doing errands, shopping? N  Preparing Food and eating ? N  Using the Toilet? N  In the past six months, have you accidently leaked urine? N  Do you have problems with loss of bowel control? N  Managing your Medications? N  Managing your Finances? N  Housekeeping or managing your Housekeeping? N  Some recent data might be hidden    Fall/Depression Screening: Fall Risk  12/10/2019 10/11/2019 08/22/2019  Falls in the past year? 1 1 1   Comment - - -  Number falls in past yr: 1 1 1   Comment - - -  Injury with Fall? 0 0 0  Comment - - -  Risk Factor Category  - - -  Risk for fall due to : History of fall(s);Impaired balance/gait;Impaired mobility;Medication  side effect History of fall(s) History of fall(s)  Follow up Falls evaluation completed;Education provided;Falls prevention discussed Falls evaluation completed Falls evaluation completed   PHQ 2/9 Scores 10/11/2019 08/12/2019 07/24/2019 02/21/2019 09/03/2018 07/23/2018 02/12/2018  PHQ - 2 Score 0 0 0 0 0 0 0   Goals Addressed              This Visit's Progress   .  Patient will report no unplanned hospitalizations within the next 90 days. (pt-stated)        CARE PLAN ENTRY (see longtitudinal plan of care for additional care plan information)   Current Barriers:  [name] Knowledge deficit related to basic heart failure pathophysiology and  self care management . Knowledge Deficits related to heart failure medications  Case Manager Clinical Goal(s):  [name] Over the next 90 days, patient will verbalize understanding of Heart Failure Action Plan and when to call doctor . Over the next 90 days, patient will take all Heart Failure mediations as prescribed . Over the next 90 days, patient will weigh daily and record (notifying MD of 3 lb weight gain over night or 5 lb in a week) . Patient will verbalize two symptoms of CHF exacerbation within the next 90 days.    Interventions:  . Basic overview and discussion of pathophysiology of Heart Failure reviewed  . Provided verbal education on low sodium diet . Reviewed Heart Failure Action Plan in depth and provided written copy . Assessed need for readable accurate scales in home . Provided education about placing scale on hard, flat surface . Advised patient to weigh each morning after emptying bladder . Discussed importance of daily weight and advised patient to weigh and record daily . Reviewed role of diuretics in prevention of fluid overload and management of heart failure  Patient Self Care Activities:  . Takes Heart Failure Medications as prescribed . Weighs daily and record (notifying MD of 3 lb weight gain over night or 5 lb in a week) . Verbalizes understanding of and follows CHF Action Plan . Adheres to low sodium diet  Initial goal documentation       Appointments:  Attended appointment with Cardiologist, Dr. Harl Bowie on 12/03/2019 and has scheduled follow up on 06/08/2020.  Attended appointment at primary care on 10/11/2019 and has scheduled follow up on 02/24/2020.  Plan: RN Health Coach will send primary care provider quarterly update. RN Health Coach will make next telephone outreach to patient within the month of October and patient agrees to future outreach.  [place] [phone] [email or web address]

## 2019-12-22 ENCOUNTER — Other Ambulatory Visit: Payer: Self-pay | Admitting: Cardiology

## 2019-12-23 ENCOUNTER — Telehealth: Payer: Medicare HMO | Admitting: Cardiology

## 2020-01-14 ENCOUNTER — Other Ambulatory Visit: Payer: Self-pay | Admitting: Family Medicine

## 2020-01-21 ENCOUNTER — Other Ambulatory Visit: Payer: Self-pay | Admitting: Family Medicine

## 2020-02-14 ENCOUNTER — Other Ambulatory Visit: Payer: Self-pay | Admitting: *Deleted

## 2020-02-14 ENCOUNTER — Encounter: Payer: Self-pay | Admitting: *Deleted

## 2020-02-14 NOTE — Patient Outreach (Signed)
[facility]) Care Management  [place]  02/14/2020   [name] 05/25/1940 [id number]   [place] Quarterly Outreach  Referral Date:07/25/2018 Referral Source:MD Referral Screening Reason for Referral:Disease Management Education Insurance:[employer] Medicare   Outreach Attempt:  Successful telephone outreach to patient for follow up.  HIPAA verified with patient. Patient reporting he is doing well.  Denies any recent sick days or falls.  Continues to weigh daily.  Weight this morning was 196 pounds, within normal range.  Denies any increase in shortness of breath or lower extremity edema.  Has obtained flu vaccine.  Appointments:  Has scheduled appointment with primary care provider, Dr. Livia Snellen on 02/24/2020.  Plan: RN Health Coach will make next telephone outreach to patient within the month of December and patient agrees to future outreach.  [place] [phone] [email or web address]

## 2020-02-24 ENCOUNTER — Other Ambulatory Visit: Payer: Self-pay

## 2020-02-24 ENCOUNTER — Ambulatory Visit (INDEPENDENT_AMBULATORY_CARE_PROVIDER_SITE_OTHER): Payer: Medicare HMO | Admitting: Family Medicine

## 2020-02-24 ENCOUNTER — Encounter: Payer: Self-pay | Admitting: Family Medicine

## 2020-02-24 VITALS — BP 110/73 | HR 50 | Temp 97.3°F | Ht 74.5 in | Wt 206.8 lb

## 2020-02-24 DIAGNOSIS — I2510 Atherosclerotic heart disease of native coronary artery without angina pectoris: Secondary | ICD-10-CM | POA: Diagnosis not present

## 2020-02-24 DIAGNOSIS — E785 Hyperlipidemia, unspecified: Secondary | ICD-10-CM | POA: Diagnosis not present

## 2020-02-24 DIAGNOSIS — I5042 Chronic combined systolic (congestive) and diastolic (congestive) heart failure: Secondary | ICD-10-CM | POA: Diagnosis not present

## 2020-02-24 DIAGNOSIS — Z1159 Encounter for screening for other viral diseases: Secondary | ICD-10-CM

## 2020-02-24 DIAGNOSIS — Z23 Encounter for immunization: Secondary | ICD-10-CM

## 2020-02-24 DIAGNOSIS — I10 Essential (primary) hypertension: Secondary | ICD-10-CM | POA: Diagnosis not present

## 2020-02-24 NOTE — Progress Notes (Signed)
Subjective:  Patient ID: [name], male    DOB: [birth year]  Age: 79 y.o. MRN: [id number]  CC: Follow-up (6 month)   HPI XAI FRERKING presents for  follow-up of hypertension. Patient has no history of headache chest pain or shortness of breath or recent cough. Patient also denies symptoms of TIA such as focal numbness or weakness. Patient denies side effects from medication. States taking it regularly.   History Finnian has a past medical history of Bradycardia, CAD in native artery, Foot drop, right foot, History of GI bleed, History of mitral valve prolapse, History of peptic ulcer disease, Hyperlipidemia, mixed, Hypertension, MI (myocardial infarction) ([place]) (2000, 2004), and Neuromuscular disorder ([place]).   He has a past surgical history that includes Coronary stent placement (2004, 2007); LEFT HEART CATH AND CORONARY ANGIOGRAPHY (N/A, 07/18/2018); Coronary/Graft Acute MI Revascularization (N/A, 07/18/2018); CORONARY STENT INTERVENTION (N/A, 07/18/2018); and CORONARY STENT INTERVENTION (N/A, 07/20/2018).   His family history includes Cancer in his daughter; Coronary artery disease in an other family member; Heart attack in his father and mother.He reports that he quit smoking about 43 years ago. His smoking use included cigarettes. He has a 60.00 pack-year smoking history. He has never used smokeless tobacco. He reports that he does not drink alcohol and does not use drugs.  Current Outpatient Medications on File Prior to Visit  Medication Sig Dispense Refill  . aspirin EC 81 MG EC tablet Take 1 tablet (81 mg total) by mouth daily.    . clopidogrel (PLAVIX) 75 MG tablet Take 1 tablet by mouth once daily 90 tablet 1  . ezetimibe (ZETIA) 10 MG tablet Take 1 tablet by mouth once daily 90 tablet 1  . furosemide (LASIX) 40 MG tablet Take 1 tablet (40 mg total) by mouth daily as needed (for leg swelling or shortness of breath). 90 tablet 1  . isosorbide mononitrate (IMDUR) 30 MG 24 hr tablet  Take 1 tablet by mouth once daily 90 tablet 1  . metoprolol succinate (TOPROL-XL) 25 MG 24 hr tablet Take 1 tablet by mouth once daily 90 tablet 1  . nitroGLYCERIN (NITROSTAT) 0.4 MG SL tablet Place 1 tablet (0.4 mg total) under the tongue every 5 (five) minutes as needed for chest pain. 25 tablet 3  . pantoprazole (PROTONIX) 40 MG tablet Take 1 tablet by mouth once daily 90 tablet 1  . rosuvastatin (CRESTOR) 40 MG tablet TAKE 1 TABLET BY MOUTH ONCE DAILY 6 IN THE EVENING 90 tablet 2  . Vitamin D, Ergocalciferol, (DRISDOL) 1.25 MG (50000 UNIT) CAPS capsule Take 1 capsule by mouth once a week 13 capsule 0   No current facility-administered medications on file prior to visit.    ROS Review of Systems  Constitutional: Negative for fever.  Respiratory: Negative for shortness of breath.   Cardiovascular: Negative for chest pain.  Musculoskeletal: Negative for arthralgias.  Skin: Negative for rash.    Objective:  BP 110/73   Pulse (!) 50   Temp (!) 97.3 F (36.3 C) (Temporal)   Ht 6' 2.5" (1.892 m)   Wt 206 lb 12.8 oz (93.8 kg)   BMI 26.20 kg/m   BP Readings from Last 3 Encounters:  02/24/20 110/73  12/03/19 101/66  10/11/19 97/62    Wt Readings from Last 3 Encounters:  02/24/20 206 lb 12.8 oz (93.8 kg)  12/03/19 (!) 204 lb (92.5 kg)  10/11/19 207 lb 6.4 oz (94.1 kg)     Physical Exam Vitals reviewed.  Constitutional:  Appearance: He is well-developed.  HENT:     Head: Normocephalic and atraumatic.     Right Ear: Tympanic membrane and external ear normal. No decreased hearing noted.     Left Ear: Tympanic membrane and external ear normal. No decreased hearing noted.     Mouth/Throat:     Pharynx: No oropharyngeal exudate or posterior oropharyngeal erythema.  Eyes:     Pupils: Pupils are equal, round, and reactive to light.  Cardiovascular:     Rate and Rhythm: Normal rate and regular rhythm.     Heart sounds: No murmur heard.   Pulmonary:     Effort: No  respiratory distress.     Breath sounds: Normal breath sounds.  Abdominal:     Palpations: Abdomen is soft. There is no mass.     Tenderness: There is no abdominal tenderness.  Musculoskeletal:     Cervical back: Normal range of motion and neck supple.  Neurological:     Coordination: Coordination abnormal (slight tremor at neck).       Assessment & Plan:   Kristion was seen today for follow-up.  Diagnoses and all orders for this visit:  Essential hypertension, benign -     CBC with Differential/Platelet -     CMP14+EGFR  Hyperlipidemia with target LDL less than 70 -     CBC with Differential/Platelet -     CMP14+EGFR -     Lipid panel  ASCVD (arteriosclerotic cardiovascular disease) -     CBC with Differential/Platelet -     CMP14+EGFR  Chronic combined systolic and diastolic congestive heart failure (HCC) -     CBC with Differential/Platelet -     CMP14+EGFR  Need for hepatitis C screening test -     Hepatitis C antibody  Other orders -     Tdap vaccine greater than or equal to 7yo IM   Allergies as of 02/24/2020      Reactions   Brilinta [ticagrelor] Shortness Of Breath   Ibuprofen Nausea Only      Medication List       Accurate as of February 24, 2020  9:10 PM. If you have any questions, ask your nurse or doctor.        aspirin 81 MG EC tablet Take 1 tablet (81 mg total) by mouth daily.   clopidogrel 75 MG tablet Commonly known as: PLAVIX Take 1 tablet by mouth once daily   ezetimibe 10 MG tablet Commonly known as: ZETIA Take 1 tablet by mouth once daily   furosemide 40 MG tablet Commonly known as: LASIX Take 1 tablet (40 mg total) by mouth daily as needed (for leg swelling or shortness of breath).   isosorbide mononitrate 30 MG 24 hr tablet Commonly known as: IMDUR Take 1 tablet by mouth once daily   metoprolol succinate 25 MG 24 hr tablet Commonly known as: TOPROL-XL Take 1 tablet by mouth once daily   nitroGLYCERIN 0.4 MG SL  tablet Commonly known as: NITROSTAT Place 1 tablet (0.4 mg total) under the tongue every 5 (five) minutes as needed for chest pain.   pantoprazole 40 MG tablet Commonly known as: PROTONIX Take 1 tablet by mouth once daily   rosuvastatin 40 MG tablet Commonly known as: CRESTOR TAKE 1 TABLET BY MOUTH ONCE DAILY 6 IN THE EVENING   Vitamin D (Ergocalciferol) 1.25 MG (50000 UNIT) Caps capsule Commonly known as: DRISDOL Take 1 capsule by mouth once a week       No orders of  the defined types were placed in this encounter.     Follow-up: Return in about 6 months (around 08/24/2020).  Claretta Fraise, M.D.

## 2020-02-25 LAB — CBC WITH DIFFERENTIAL/PLATELET
Basophils Absolute: 0 10*3/uL (ref 0.0–0.2)
Basos: 1 %
EOS (ABSOLUTE): 0.1 10*3/uL (ref 0.0–0.4)
Eos: 3 %
Hematocrit: 43.3 % (ref 37.5–51.0)
Hemoglobin: 14.9 g/dL (ref 13.0–17.7)
Immature Grans (Abs): 0 10*3/uL (ref 0.0–0.1)
Immature Granulocytes: 0 %
Lymphocytes Absolute: 1.3 10*3/uL (ref 0.7–3.1)
Lymphs: 29 %
MCH: 30.5 pg (ref 26.6–33.0)
MCHC: 34.4 g/dL (ref 31.5–35.7)
MCV: 89 fL (ref 79–97)
Monocytes Absolute: 0.5 10*3/uL (ref 0.1–0.9)
Monocytes: 10 %
Neutrophils Absolute: 2.5 10*3/uL (ref 1.4–7.0)
Neutrophils: 57 %
Platelets: 204 10*3/uL (ref 150–450)
RBC: 4.89 x10E6/uL (ref 4.14–5.80)
RDW: 13 % (ref 11.6–15.4)
WBC: 4.4 10*3/uL (ref 3.4–10.8)

## 2020-02-25 LAB — CMP14+EGFR
ALT: 15 IU/L (ref 0–44)
AST: 18 IU/L (ref 0–40)
Albumin/Globulin Ratio: 1.8 (ref 1.2–2.2)
Albumin: 4.2 g/dL (ref 3.7–4.7)
Alkaline Phosphatase: 108 IU/L (ref 44–121)
BUN/Creatinine Ratio: 10 (ref 10–24)
BUN: 13 mg/dL (ref 8–27)
Bilirubin Total: 1.3 mg/dL — ABNORMAL HIGH (ref 0.0–1.2)
CO2: 24 mmol/L (ref 20–29)
Calcium: 9.1 mg/dL (ref 8.6–10.2)
Chloride: 104 mmol/L (ref 96–106)
Creatinine, Ser: 1.31 mg/dL — ABNORMAL HIGH (ref 0.76–1.27)
GFR calc Af Amer: 60 mL/min/{1.73_m2} (ref >59)
GFR calc non Af Amer: 52 mL/min/{1.73_m2} — ABNORMAL LOW (ref >59)
Globulin, Total: 2.3 g/dL (ref 1.5–4.5)
Glucose: 107 mg/dL — ABNORMAL HIGH (ref 65–99)
Potassium: 3.3 mmol/L — ABNORMAL LOW (ref 3.5–5.2)
Sodium: 142 mmol/L (ref 134–144)
Total Protein: 6.5 g/dL (ref 6.0–8.5)

## 2020-02-25 LAB — HEPATITIS C ANTIBODY: Hep C Virus Ab: <0.1 s/co ratio (ref 0.0–0.9)

## 2020-02-25 LAB — LIPID PANEL
Chol/HDL Ratio: 2.8 ratio (ref 0.0–5.0)
Cholesterol, Total: 142 mg/dL (ref 100–199)
HDL: 51 mg/dL (ref >39)
LDL Chol Calc (NIH): 74 mg/dL (ref 0–99)
Triglycerides: 87 mg/dL (ref 0–149)
VLDL Cholesterol Cal: 17 mg/dL (ref 5–40)

## 2020-02-25 NOTE — Progress Notes (Signed)
Hello Evann,  Your lab result is normal and/or stable.Some minor variations that are not significant are commonly marked abnormal, but do not represent any medical problem for you.  Best regards, Trestan Vahle, M.D.

## 2020-04-21 ENCOUNTER — Other Ambulatory Visit: Payer: Self-pay | Admitting: *Deleted

## 2020-04-21 NOTE — Patient Outreach (Signed)
[place] [facility]) Care Management  [place]  04/21/2020   [name] 08-16-1940 [id number]   [place] Quarterly Outreach  Referral Date:07/25/2018 Referral Source:MD Referral Screening Reason for Referral:Disease Management Education Insurance:[employer] Medicare   Outreach Attempt:  Outreach attempt #1 to patient for follow up.  Line busy on multiple outreach attempts, unable to leave voice message.  Plan: RN Health Coach will make another outreach attempt within the month of January if no return call back from patient.    [place] [phone] [name]

## 2020-04-29 ENCOUNTER — Other Ambulatory Visit: Payer: Self-pay | Admitting: Cardiology

## 2020-04-29 ENCOUNTER — Telehealth: Payer: Self-pay

## 2020-04-29 NOTE — Telephone Encounter (Signed)
Nonemergent. Defer to PCP

## 2020-04-29 NOTE — Telephone Encounter (Signed)
  Prescription Request  04/29/2020  What is the name of the medication or equipment? Vitamin D, Ergocalciferol, (DRISDOL) 1.25 MG (50000 UNIT) CAPS capsule    Have you contacted your pharmacy to request a refill? (if applicable) YES  Which pharmacy would you like this sent to? [facility]   Patient notified that their request is being sent to the clinical staff for review and that they should receive a response within 2 business days.

## 2020-04-29 NOTE — Telephone Encounter (Signed)
Last Vit D level was 02/2019

## 2020-04-30 ENCOUNTER — Other Ambulatory Visit: Payer: Self-pay | Admitting: Family Medicine

## 2020-04-30 MED ORDER — VITAMIN D (ERGOCALCIFEROL) 1.25 MG (50000 UNIT) PO CAPS: 50000.0000 [IU] | ORAL_CAPSULE | ORAL | 0 refills | Status: DC

## 2020-04-30 NOTE — Telephone Encounter (Signed)
Patient aware.

## 2020-04-30 NOTE — Telephone Encounter (Signed)
I sent in his Vitamin D. Thanks, WS

## 2020-05-11 ENCOUNTER — Other Ambulatory Visit: Payer: Self-pay | Admitting: Cardiology

## 2020-06-02 ENCOUNTER — Other Ambulatory Visit: Payer: Self-pay | Admitting: *Deleted

## 2020-06-02 NOTE — Patient Outreach (Signed)
[place] [facility]) Care Management  06/02/2020  [name] 18-Feb-1941 [id number]   RN Health Coach attempted follow up outreach call to patient.  Patient was unavailable. HIPPA compliance voicemail message left with return callback number.  Plan: RN will call patient again within 30 days.  [name] Care Management [phone]

## 2020-06-06 ENCOUNTER — Other Ambulatory Visit: Payer: Self-pay | Admitting: Family Medicine

## 2020-06-08 ENCOUNTER — Ambulatory Visit: Payer: Medicare HMO | Admitting: Cardiology

## 2020-06-19 ENCOUNTER — Encounter: Payer: Self-pay | Admitting: Cardiology

## 2020-06-19 ENCOUNTER — Other Ambulatory Visit (HOSPITAL_COMMUNITY)
Admission: RE | Admit: 2020-06-19 | Discharge: 2020-06-19 | Disposition: A | Discharge status: Home / Self Care | Payer: Medicare HMO | Source: Ambulatory Visit | Attending: Cardiology | Admitting: Cardiology

## 2020-06-19 ENCOUNTER — Ambulatory Visit: Payer: Medicare HMO | Admitting: Cardiology

## 2020-06-19 VITALS — BP 132/72 | HR 44 | Ht 74.5 in | Wt 209.0 lb

## 2020-06-19 DIAGNOSIS — I10 Essential (primary) hypertension: Secondary | ICD-10-CM | POA: Insufficient documentation

## 2020-06-19 DIAGNOSIS — I5032 Chronic diastolic (congestive) heart failure: Secondary | ICD-10-CM | POA: Diagnosis not present

## 2020-06-19 DIAGNOSIS — R001 Bradycardia, unspecified: Secondary | ICD-10-CM | POA: Diagnosis not present

## 2020-06-19 DIAGNOSIS — I2510 Atherosclerotic heart disease of native coronary artery without angina pectoris: Secondary | ICD-10-CM

## 2020-06-19 DIAGNOSIS — E782 Mixed hyperlipidemia: Secondary | ICD-10-CM

## 2020-06-19 LAB — BASIC METABOLIC PANEL
Anion gap: 6 (ref 5–15)
BUN: 16 mg/dL (ref 8–23)
CO2: 27 mmol/L (ref 22–32)
Calcium: 8.8 mg/dL — ABNORMAL LOW (ref 8.9–10.3)
Chloride: 108 mmol/L (ref 98–111)
Creatinine, Ser: 1.21 mg/dL (ref 0.61–1.24)
GFR, Estimated: >60 mL/min (ref >60)
Glucose, Bld: 104 mg/dL — ABNORMAL HIGH (ref 70–99)
Potassium: 4.1 mmol/L (ref 3.5–5.1)
Sodium: 141 mmol/L (ref 135–145)

## 2020-06-19 MED ORDER — FUROSEMIDE 40 MG PO TABS
40.0000 mg | ORAL_TABLET | Freq: Every day | ORAL | 1 refills | Status: DC | PRN
Start: 2020-06-19 — End: 2020-12-18

## 2020-06-19 MED ORDER — METOPROLOL SUCCINATE ER 25 MG PO TB24: 12.5000 mg | ORAL_TABLET | Freq: Every day | ORAL | 1 refills | Status: DC

## 2020-06-19 MED ORDER — EZETIMIBE 10 MG PO TABS: 10.0000 mg | ORAL_TABLET | Freq: Every day | ORAL | 1 refills | Status: DC

## 2020-06-19 NOTE — Progress Notes (Signed)
Clinical Summary Mr. Moten is a 80 y.o.male seen today for follow up of the following medical problems.   1. CAD - multiple PCIs in the past - hx of MI in 2004, with stenting to RCA (ostium down to PDA). Of note had GI bleed 2 months later on DAPT and NSAIDs requiring transfusion.  - cath from 2007 with occluded LCX OM, received a BMS. RCA stents were patent at that time. LVEF 70% by LV gram at that time, no echoes in our system   - admit 07/2018 with acute posterior STEMI, taken to cath lab - 07/2018 cath showed ostal LAD 60%, mid LAD 99%, D1 95%, OM1 occluded, RCA 30%, mid RCA 95%.Received DES to OM1, then staged PCI to RCA.From notes no options on treating LAD, diffusely diseased and no target for CABG. Recs for indefinitie DAPT.LVEDP 29, limited diuresis due to uptrend in Cr.  - changed from brillinta to plavix due to SOB    -no chest pain. Chronic SOB that is up and down - compliant with meds   2. Chronic diastolic HF - no recent edema. Has not required fluid pill but has as needed - home weights 195-200 lbs and stable  3. HTN - compliant with meds  4. Hyperlipidemia  - 02/2020 TC 142 TG 87 HDL 51 LDL 74 - compliant with staitn  5. Chronic bradycardia - asymptomatic, has remained on toprol   Has not had covid vaccine, not in favor.   Past Medical History:  Diagnosis Date  . Bradycardia   . CAD in native artery   . Foot drop, right foot   . History of GI bleed   . History of mitral valve prolapse   . History of peptic ulcer disease   . Hyperlipidemia, mixed   . Hypertension   . MI (myocardial infarction) ([place]) 2000, 2004  . Neuromuscular disorder (HCC)      Allergies  Allergen Reactions  . Brilinta [Ticagrelor] Shortness Of Breath  . Ibuprofen Nausea Only     Current Outpatient Medications  Medication Sig Dispense Refill  . aspirin EC 81 MG EC tablet Take 1 tablet (81 mg total) by mouth daily.    . clopidogrel (PLAVIX) 75 MG  tablet Take 1 tablet by mouth once daily 90 tablet 3  . ezetimibe (ZETIA) 10 MG tablet Take 1 tablet by mouth once daily 90 tablet 1  . furosemide (LASIX) 40 MG tablet Take 1 tablet (40 mg total) by mouth daily as needed (for leg swelling or shortness of breath). 90 tablet 1  . isosorbide mononitrate (IMDUR) 30 MG 24 hr tablet Take 1 tablet by mouth once daily 90 tablet 3  . metoprolol succinate (TOPROL-XL) 25 MG 24 hr tablet Take 1 tablet by mouth once daily 90 tablet 3  . nitroGLYCERIN (NITROSTAT) 0.4 MG SL tablet Place 1 tablet (0.4 mg total) under the tongue every 5 (five) minutes as needed for chest pain. 25 tablet 3  . pantoprazole (PROTONIX) 40 MG tablet Take 1 tablet by mouth once daily 90 tablet 1  . rosuvastatin (CRESTOR) 40 MG tablet TAKE 1 TABLET BY MOUTH ONCE DAILY AT 6 IN THE EVENING 90 tablet 1  . Vitamin D, Ergocalciferol, (DRISDOL) 1.25 MG (50000 UNIT) CAPS capsule Take 1 capsule (50,000 Units total) by mouth once a week. 13 capsule 0   No current facility-administered medications for this visit.     Past Surgical History:  Procedure Laterality Date  . CORONARY STENT INTERVENTION N/A  07/18/2018   Procedure: CORONARY STENT INTERVENTION;  Surgeon: Martinique, Peter M, MD;  Location: [facility];  Service: Cardiovascular;  Laterality: N/A;  . CORONARY STENT INTERVENTION N/A 07/20/2018   Procedure: CORONARY STENT INTERVENTION;  Surgeon: Martinique, Peter M, MD;  Location: [facility];  Service: Cardiovascular;  Laterality: N/A;  . CORONARY STENT PLACEMENT  2004, 2007   5 in 2004, 1 in 2007  . CORONARY/GRAFT ACUTE MI REVASCULARIZATION N/A 07/18/2018   Procedure: Coronary/Graft Acute MI Revascularization;  Surgeon: Martinique, Peter M, MD;  Location: [place];  Service: Cardiovascular;  Laterality: N/A;  . LEFT HEART CATH AND CORONARY ANGIOGRAPHY N/A 07/18/2018   Procedure: LEFT HEART CATH AND CORONARY ANGIOGRAPHY;  Surgeon: Martinique, Peter M, MD;  Location: [place] [facility];  Service: Cardiovascular;  Laterality: N/A;     Allergies  Allergen Reactions  . Brilinta [Ticagrelor] Shortness Of Breath  . Ibuprofen Nausea Only      Family History  Problem Relation Age of Onset  . Heart attack Mother   . Heart attack Father   . Coronary artery disease Other   . Cancer Daughter      Social History Mr. Chavarin reports that he quit smoking about 44 years ago. His smoking use included cigarettes. He has a 60.00 pack-year smoking history. He has never used smokeless tobacco. Mr. Eads reports no history of alcohol use.   Review of Systems CONSTITUTIONAL: No weight loss, fever, chills, weakness or fatigue.  HEENT: Eyes: No visual loss, blurred vision, double vision or yellow sclerae.No hearing loss, sneezing, congestion, runny nose or sore throat.  SKIN: No rash or itching.  CARDIOVASCULAR: per hpi RESPIRATORY: No shortness of breath, cough or sputum.  GASTROINTESTINAL: No anorexia, nausea, vomiting or diarrhea. No abdominal pain or blood.  GENITOURINARY: No burning on urination, no polyuria NEUROLOGICAL: No headache, dizziness, syncope, paralysis, ataxia, numbness or tingling in the extremities. No change in bowel or bladder control.  MUSCULOSKELETAL: No muscle, back pain, joint pain or stiffness.  LYMPHATICS: No enlarged nodes. No history of splenectomy.  PSYCHIATRIC: No history of depression or anxiety.  ENDOCRINOLOGIC: No reports of sweating, cold or heat intolerance. No polyuria or polydipsia.  [name]   Physical Examination Today's Vitals   06/19/20 1041  BP: 132/72  Pulse: (!) 44  SpO2: 96%  Weight: 209 lb (94.8 kg)  Height: 6' 2.5" (1.892 m)   Body mass index is 26.48 kg/m.  Gen: resting comfortably, no acute distress HEENT: no scleral icterus, pupils equal round and reactive, no palptable cervical adenopathy,  CV: RRR, no m/r/g, no jvd Resp: Clear to auscultation bilaterally GI: abdomen is soft, non-tender, non-distended, normal bowel  sounds, no hepatosplenomegaly MSK: extremities are warm, no edema.  Skin: warm, no rash Neuro:  no focal deficits Psych: appropriate affect   Diagnostic Studies Cath 2007 Central aortic pressure is 101/68 with a mean of 84. LV pressure is 94/1  with an EDP of 6. There is no aortic stenosis.  Left main was normal.  LAD was a large vessel coursing to the apex. There was very prominent  severe ectasia in the proximal portion with aneurysmal dilatation and  swirling flow. However, no high grade stenosis. In the mid LAD, there was  a diffuse 40-50% segmental plaquing. In the distal LAD, there was a tubular  70-80% stenosis. There also were two diagonals. The first diagonal is a  medium vessel with a long 90% stenosis. The second diagonal was small with  branching vessel with a 70-80% proximal lesion.  The left circumflex was a moderate sized system. It gave off a large  branching OM-1 and a small OM-2. There is a 40% proximal lesion in the  circumflex. The lower Shaeley Segall of the OM was totally occluded and the distal  part of the vessel filled through collaterals with TIMI I flow. There is a  40% blockage in the upper portion of the OM.  The right coronary artery was a large dominant vessel. It gave off a  moderate sized PDA and two small PLs. There was evidence of previously  placed stent from the ostial portion down to the PDA. There was 40% ostial  stenosis with damping of the 6-French catheter. This did not change with  sublingual nitroglycerin. There is a 30% lesion in the proximal to mid  portion.  Left ventriculogram done the RAO position showed an EF of 70% with mild  mitral valve prolapse. There was no wall motion abnormalities and there was  no mitral regurgitation. Left ventriculogram done in the LAO position  showed an EF of 70% with no regional wall motion abnormalities.  Abdominal aortogram showed patent renal arteries bilaterally with no  abdominal  aortic aneurysm.  A left subclavian done for possible bypass surgery showed a patent LIMA to  the chest wall with no obstructions in the subclavian.  ASSESSMENT:  1. Three vessel native coronary artery disease.  2. The LAD has diffuse ectasia and swelling flow proximally as described  above. There is a borderline lesion in the distal LAD and a high grade  disease in the diagonal, but these do not appear to be the culprit  lesion.  3. Totally occluded OM1 which appears to be the culprit.  4. Patent right coronary stents.  5. Normal LV function with mild mitral valve prolapse but no significant  mitral regurgitation.  CONCLUSION: Successful PCI of a totally occluded circumflex marginal vessel  with improvement in narrowing from 100% to 0% and improvement in flow from  TIMI 0 to TIMI III flow using a bare metal stent.   07/2018 echo IMPRESSIONS   1. The left ventricle has low normal systolic function, with an ejection fraction of 50-55%. The cavity size was normal. Left ventricular diastolic Doppler parameters are indeterminate. 2. Mild hypokinesis of the left ventricular, mid-apical inferoseptal wall, inferior segment and inferolateral wall. 3. The right ventricle has normal systolic function. The cavity was normal. There is no increase in right ventricular wall thickness. 4. The aortic valve is tricuspid Mild thickening of the aortic valve Mild calcification of the aortic valve. Aortic valve regurgitation was not assessed by color flow Doppler.   07/2018 cath  Previously placed Mid RCA to Dist RCA stent (unknown type) is widely patent.  Ost RCA to Mid RCA lesion is 30% stenosed.  Mid RCA lesion is 95% stenosed.  Ost 1st Mrg to 1st Mrg lesion is 100% stenosed.  Ost LAD to Prox LAD lesion is 60% stenosed.  Prox LAD lesion is 25% stenosed.  Ost 1st Diag to 1st Diag lesion is 95% stenosed.  Mid LAD to Dist LAD lesion is 99% stenosed.  Post  intervention, there is a 0% residual stenosis.  A drug-eluting stent was successfully placed using a STENT SYNERGY DES 3X24.  LV end diastolic pressure is moderately elevated.  1. Severe 3 vessel obstructive CAD - Severe aneurysmal disease in the proximal LAD. Diffuse 99% stenosis in the mid to distal LAD. 95% bifurcating first diagonal.  -  100% occlusion of the large first OM at site of prior stent. This is the culprit lesion - 95% mid RCA in stent 2. Moderately elevated LVEDP 3. Successful PCI of the OM 1 with DES x1  Discussed with Dr. Gwenlyn Found. There are no options for treating the LAD. It is diffusely diseased and there is no target for CABG. There are some right to left collaterals to the distal LAD. We proceeded with emergent PCI of the OM1. Recommend DAPT indefinitely given multiple layers of stent. Plan to bring patient back in 2 days for PCI of the RCA. Will treat the LAD medically. Check LV function by Echo. Maximize medical therapy. Stress importance of medication compliance.   07/2018 staged PCI  Mid RCA lesion is 95% stenosed.  A drug-eluting stent was successfully placed using a STENT SYNERGY DES 3.5X16.  Post intervention, there is a 0% residual stenosis.  1. Successful PCI of the mid RCA with DES x 1.  Plan: continue DAPT indefinitely. Anticipate DC home in am.    Assessment and Plan  1. CAD - he is committed to indefinite DAPT as reported above - no recent symptoms, continue current meds  2. Chronic diastolic HF - euvolemic, continue prn lasix.   3. HTN - at goal, continue currne tmeds  4. Hyperlipidemia - LDL essentially at goal, continue current meds  5. Bradycardia - chronic, though first time we have seen in the 40s. Asymptomatic - lower toprol to 12.5mg  daily.       Arnoldo Lenis, M.D.

## 2020-06-19 NOTE — Patient Instructions (Signed)
Your physician wants you to follow-up in: [place] will receive a reminder letter in the mail two months in advance. If you don't receive a letter, please call our office to schedule the follow-up appointment.  Your physician has recommended you make the following change in your medication:   DECREASE TOPROL XL 12.5 MG (1/2 TABLET) DAILY   STOP [place]   START OVER THE COUNTER ZANTAC OR PEPCID   Your physician recommends that you return for lab work BMP  Thank you for choosing [facility]!!

## 2020-07-01 ENCOUNTER — Other Ambulatory Visit: Payer: Self-pay | Admitting: *Deleted

## 2020-07-01 NOTE — Patient Outreach (Signed)
[place] [facility]) Care Management  [place]  07/01/2020   [name] 1940/07/03 [id number]  RN Health Coach telephone call to patient.  [employer] compliance verified. Per patient he has not been weighing daily. Patient stated he does normally take his lasix and medications as per ordered. Per patient he has had some nausea and vomiting from a virus that he stated he hd. Per patient he is feeling much better. RN discussed monitoring weight. RN discussed eating healthy. Patient has agreed to follow up outreach calls.   Encounter Medications:  Outpatient Encounter Medications as of 07/01/2020  Medication Sig  . aspirin EC 81 MG EC tablet Take 1 tablet (81 mg total) by mouth daily.  . clopidogrel (PLAVIX) 75 MG tablet Take 1 tablet by mouth once daily  . ezetimibe (ZETIA) 10 MG tablet Take 1 tablet (10 mg total) by mouth daily.  . furosemide (LASIX) 40 MG tablet Take 1 tablet (40 mg total) by mouth daily as needed (for leg swelling or shortness of breath).  . isosorbide mononitrate (IMDUR) 30 MG 24 hr tablet Take 1 tablet by mouth once daily  . metoprolol succinate (TOPROL-XL) 25 MG 24 hr tablet Take 0.5 tablets (12.5 mg total) by mouth daily.  . nitroGLYCERIN (NITROSTAT) 0.4 MG SL tablet Place 1 tablet (0.4 mg total) under the tongue every 5 (five) minutes as needed for chest pain.  . rosuvastatin (CRESTOR) 40 MG tablet TAKE 1 TABLET BY MOUTH ONCE DAILY AT 6 IN THE EVENING  . Vitamin D, Ergocalciferol, (DRISDOL) 1.25 MG (50000 UNIT) CAPS capsule Take 1 capsule (50,000 Units total) by mouth once a week.   No facility-administered encounter medications on file as of 07/01/2020.    Functional Status:  In your present state of health, do you have any difficulty performing the following activities: 07/24/2019  Hearing? Y  Comment says he is hard of hearing  Vision? N  Comment wears readers for fine print-hasn't had an eye exam in years-encouraged to schedule appt  Difficulty  concentrating or making decisions? Y  Comment has noticed his memory isn't as good as it used to be  Walking or climbing stairs? Y  Comment due to right foot drop  Dressing or bathing? N  Doing errands, shopping? N  Preparing Food and eating ? N  Using the Toilet? N  In the past six months, have you accidently leaked urine? N  Do you have problems with loss of bowel control? N  Managing your Medications? N  Managing your Finances? N  Housekeeping or managing your Housekeeping? N  Some recent data might be hidden    Fall/Depression Screening: Fall Risk  07/01/2020 02/24/2020 12/10/2019  Falls in the past year? 1 1 1   Comment - - -  Number falls in past yr: 1 1 1   Comment - - -  Injury with Fall? 0 0 0  Comment - - -  Risk Factor Category  - - -  Risk for fall due to : History of fall(s);Impaired balance/gait;Impaired mobility History of fall(s);Impaired balance/gait;Impaired mobility History of fall(s);Impaired balance/gait;Impaired mobility;Medication side effect  Follow up Falls evaluation completed Falls evaluation completed Falls evaluation completed;Education provided;Falls prevention discussed   PHQ 2/9 Scores 02/24/2020 10/11/2019 08/12/2019 07/24/2019 02/21/2019 09/03/2018 07/23/2018  PHQ - 2 Score 0 0 0 0 0 0 0    Assessment:  Goals Addressed            This Visit's Progress   . [facility]) Learn More About My  Health       Timeframe:  Long-Range Goal Priority:  Medium Start Date:      83382505                       Expected End Date:      [id number]                Follow Up Date [id number]   - tell my story and reason for my visit - make a list of questions - ask questions - repeat what I heard to make sure I understand - bring a list of my medicines to the visit - speak up when I don't understand    Why is this important?   The best way to learn about your health and care is by talking to the doctor and nurse.  They will answer your questions and give you information in  the way that you like best.    Notes:     . [facility]) Track and Manage Activity and Exertion       Timeframe:  Long-Range Goal Priority:  Medium Start Date:     97353299                        Expected End Date:    24268341                  Follow Up Date 96222979   - avoid heavy exercise on very hot days - make an activity or exercise plan - pace activity allowing for rest - track symptoms during activity in diary    Why is this important?   Exercising is very important when managing your heart failure.  It will help your heart get stronger.    Notes:     . [facility]) Track and Manage Fluids and Swelling       Timeframe:  Long-Range Goal Priority:  Medium Start Date:    [id number]                         Expected End Date:     74081448                 Follow Up Date [id number]   - call office if I gain more than 2 pounds in one day or 5 pounds in one week - keep legs up while sitting - track weight in diary - use salt in moderation - watch for swelling in feet, ankles and legs every day - weigh myself daily    Why is this important?   It is important to check your weight daily and watch how much salt and liquids you have.  It will help you to manage your heart failure.    Notes:        Plan:  Follow-up:  Patient agrees to Care Plan and Follow-up. Reminder to schedule eye exam F.A.S.T. acronym picture sheet for stroke Provided picture sheet on foods high and low in sodium Provided a calendar book for documentation RN sent update assessment to PCP  [facility] [phone]

## 2020-07-01 NOTE — Patient Instructions (Signed)
Goals Addressed            This Visit's Progress   . [facility]) Learn More About My Health       Timeframe:  Long-Range Goal Priority:  Medium Start Date:      [id number]                       Expected End Date:      66294765                Follow Up Date [id number]   - tell my story and reason for my visit - make a list of questions - ask questions - repeat what I heard to make sure I understand - bring a list of my medicines to the visit - speak up when I don't understand    Why is this important?   The best way to learn about your health and care is by talking to the doctor and nurse.  They will answer your questions and give you information in the way that you like best.    Notes:     . [facility]) Track and Manage Activity and Exertion       Timeframe:  Long-Range Goal Priority:  Medium Start Date:     56812751                        Expected End Date:    70017494                  Follow Up Date 49675916   - avoid heavy exercise on very hot days - make an activity or exercise plan - pace activity allowing for rest - track symptoms during activity in diary    Why is this important?   Exercising is very important when managing your heart failure.  It will help your heart get stronger.    Notes:     . [facility]) Track and Manage Fluids and Swelling       Timeframe:  Long-Range Goal Priority:  Medium Start Date:    38466599                         Expected End Date:     35701779                 Follow Up Date [id number]   - call office if I gain more than 2 pounds in one day or 5 pounds in one week - keep legs up while sitting - track weight in diary - use salt in moderation - watch for swelling in feet, ankles and legs every day - weigh myself daily    Why is this important?   It is important to check your weight daily and watch how much salt and liquids you have.  It will help you to manage your heart failure.    Notes:

## 2020-07-07 DIAGNOSIS — I739 Peripheral vascular disease, unspecified: Secondary | ICD-10-CM | POA: Diagnosis not present

## 2020-07-07 DIAGNOSIS — I25119 Atherosclerotic heart disease of native coronary artery with unspecified angina pectoris: Secondary | ICD-10-CM | POA: Diagnosis not present

## 2020-07-07 DIAGNOSIS — Z008 Encounter for other general examination: Secondary | ICD-10-CM | POA: Diagnosis not present

## 2020-07-07 DIAGNOSIS — E261 Secondary hyperaldosteronism: Secondary | ICD-10-CM | POA: Diagnosis not present

## 2020-07-07 DIAGNOSIS — I252 Old myocardial infarction: Secondary | ICD-10-CM | POA: Diagnosis not present

## 2020-07-07 DIAGNOSIS — I509 Heart failure, unspecified: Secondary | ICD-10-CM | POA: Diagnosis not present

## 2020-07-07 DIAGNOSIS — I951 Orthostatic hypotension: Secondary | ICD-10-CM | POA: Diagnosis not present

## 2020-07-07 DIAGNOSIS — E559 Vitamin D deficiency, unspecified: Secondary | ICD-10-CM | POA: Diagnosis not present

## 2020-07-07 DIAGNOSIS — I130 Hypertensive heart and chronic kidney disease with heart failure and stage 1 through stage 4 chronic kidney disease, or unspecified chronic kidney disease: Secondary | ICD-10-CM | POA: Diagnosis not present

## 2020-07-07 DIAGNOSIS — N189 Chronic kidney disease, unspecified: Secondary | ICD-10-CM | POA: Diagnosis not present

## 2020-07-07 DIAGNOSIS — E785 Hyperlipidemia, unspecified: Secondary | ICD-10-CM | POA: Diagnosis not present

## 2020-07-09 ENCOUNTER — Telehealth: Payer: Self-pay | Admitting: *Deleted

## 2020-07-09 NOTE — Telephone Encounter (Signed)
-----   Message from Arnoldo Lenis, MD sent at 07/08/2020 12:21 PM EST ----- Labs look fine   Zandra Abts MD

## 2020-07-09 NOTE — Telephone Encounter (Signed)
Pt aware.

## 2020-08-02 ENCOUNTER — Other Ambulatory Visit: Payer: Self-pay | Admitting: Family Medicine

## 2020-08-24 ENCOUNTER — Other Ambulatory Visit: Payer: Self-pay

## 2020-08-24 ENCOUNTER — Ambulatory Visit (INDEPENDENT_AMBULATORY_CARE_PROVIDER_SITE_OTHER): Payer: Medicare HMO | Admitting: Family Medicine

## 2020-08-24 ENCOUNTER — Encounter: Payer: Self-pay | Admitting: Family Medicine

## 2020-08-24 VITALS — BP 103/61 | HR 60 | Temp 97.0°F | Ht 74.5 in | Wt 205.4 lb

## 2020-08-24 DIAGNOSIS — I2510 Atherosclerotic heart disease of native coronary artery without angina pectoris: Secondary | ICD-10-CM

## 2020-08-24 DIAGNOSIS — I10 Essential (primary) hypertension: Secondary | ICD-10-CM

## 2020-08-24 DIAGNOSIS — H259 Unspecified age-related cataract: Secondary | ICD-10-CM | POA: Diagnosis not present

## 2020-08-24 DIAGNOSIS — E785 Hyperlipidemia, unspecified: Secondary | ICD-10-CM

## 2020-08-24 DIAGNOSIS — I5042 Chronic combined systolic (congestive) and diastolic (congestive) heart failure: Secondary | ICD-10-CM

## 2020-08-24 NOTE — Progress Notes (Signed)
Subjective:  Patient ID: [name], male    DOB: 1940/12/10  Age: 80 y.o. MRN: [id number]  CC: Medical Management of Chronic Issues   HPI [name] presents for  follow-up of hypertension. Patient has no history of headache chest pain or shortness of breath or recent cough. Patient also denies symptoms of TIA such as focal numbness or weakness. Patient denies side effects from medication. States taking it regularly.    History [name] has a past medical history of Acute combined systolic and diastolic heart failure ([place]) (07/19/2018), Bradycardia, CAD in native artery, Foot drop, right foot, History of GI bleed, History of mitral valve prolapse, History of peptic ulcer disease, Hyperlipidemia, mixed, Hypertension, MI (myocardial infarction) ([place]) (2000, 2004), Neuromuscular disorder ([place]), PUD, HX OF (01/03/2009), and STEMI involving left circumflex coronary artery ([place]) (07/18/2018).   He has a past surgical history that includes Coronary stent placement (2004, 2007); LEFT HEART CATH AND CORONARY ANGIOGRAPHY (N/A, 07/18/2018); Coronary/Graft Acute MI Revascularization (N/A, 07/18/2018); CORONARY STENT INTERVENTION (N/A, 07/18/2018); and CORONARY STENT INTERVENTION (N/A, 07/20/2018).   His family history includes Cancer in his daughter; Coronary artery disease in an other family member; Heart attack in his father and mother.He reports that he quit smoking about 44 years ago. His smoking use included cigarettes. He has a 60.00 pack-year smoking history. He has never used smokeless tobacco. He reports that he does not drink alcohol and does not use drugs.  Current Outpatient Medications on File Prior to Visit  Medication Sig Dispense Refill  . aspirin EC 81 MG EC tablet Take 1 tablet (81 mg total) by mouth daily.    . clopidogrel (PLAVIX) 75 MG tablet Take 1 tablet by mouth once daily 90 tablet 3  . ezetimibe (ZETIA) 10 MG tablet Take 1 tablet (10 mg total) by mouth daily. 90 tablet 1  .  furosemide (LASIX) 40 MG tablet Take 1 tablet (40 mg total) by mouth daily as needed (for leg swelling or shortness of breath). 90 tablet 1  . isosorbide mononitrate (IMDUR) 30 MG 24 hr tablet Take 1 tablet by mouth once daily 90 tablet 3  . metoprolol succinate (TOPROL-XL) 25 MG 24 hr tablet Take 0.5 tablets (12.5 mg total) by mouth daily. 45 tablet 1  . nitroGLYCERIN (NITROSTAT) 0.4 MG SL tablet Place 1 tablet (0.4 mg total) under the tongue every 5 (five) minutes as needed for chest pain. 25 tablet 3  . rosuvastatin (CRESTOR) 40 MG tablet TAKE 1 TABLET BY MOUTH ONCE DAILY AT 6 IN THE EVENING 90 tablet 1  . Vitamin D, Ergocalciferol, (DRISDOL) 1.25 MG (50000 UNIT) CAPS capsule Take 1 capsule (50,000 Units total) by mouth once a week. 13 capsule 0   No current facility-administered medications on file prior to visit.    ROS Review of Systems  Constitutional: Negative for fever.  Respiratory: Negative for shortness of breath.   Cardiovascular: Negative for chest pain.  Musculoskeletal: Negative for arthralgias.  Skin: Negative for rash.  Neurological: Positive for weakness (right leg) and numbness (with tingling at times in the hands).    Objective:  BP 103/61   Pulse 60   Temp (!) 97 F (36.1 C)   Ht 6' 2.5" (1.892 m)   Wt 205 lb 6.4 oz (93.2 kg)   SpO2 98%   BMI 26.02 kg/m   BP Readings from Last 3 Encounters:  08/24/20 103/61  06/19/20 132/72  02/24/20 110/73    Wt Readings from Last 3 Encounters:  08/24/20 205 lb 6.4 oz (93.2 kg)  06/19/20 209 lb (94.8 kg)  02/24/20 206 lb 12.8 oz (93.8 kg)     Physical Exam Vitals reviewed.  Constitutional:      Appearance: He is well-developed.  HENT:     Head: Normocephalic and atraumatic.     Right Ear: Tympanic membrane and external ear normal. No decreased hearing noted.     Left Ear: Tympanic membrane and external ear normal. No decreased hearing noted.     Mouth/Throat:     Pharynx: No oropharyngeal exudate or  posterior oropharyngeal erythema.  Eyes:     Pupils: Pupils are equal, round, and reactive to light.  Cardiovascular:     Rate and Rhythm: Normal rate and regular rhythm.     Heart sounds: No murmur heard.   Pulmonary:     Effort: No respiratory distress.     Breath sounds: Normal breath sounds.  Abdominal:     General: Bowel sounds are normal.     Palpations: Abdomen is soft. There is no mass.     Tenderness: There is no abdominal tenderness.  Musculoskeletal:     Cervical back: Normal range of motion and neck supple.       Assessment & Plan:   [name] was seen today for medical management of chronic issues.  Diagnoses and all orders for this visit:  Essential hypertension, benign -     CBC with Differential/Platelet -     CMP14+EGFR -     Lipid panel  Hyperlipidemia with target LDL less than 70 -     CBC with Differential/Platelet -     CMP14+EGFR -     Lipid panel  ASCVD (arteriosclerotic cardiovascular disease) -     CBC with Differential/Platelet -     CMP14+EGFR -     Lipid panel  Chronic combined systolic and diastolic congestive heart failure (HCC) -     CBC with Differential/Platelet -     CMP14+EGFR -     Lipid panel  Age-related cataract of both eyes, unspecified age-related cataract type -     Ambulatory referral to Ophthalmology   Allergies as of 08/24/2020      Reactions   Brilinta [ticagrelor] Shortness Of Breath   Ibuprofen Nausea Only      Medication List       Accurate as of August 24, 2020  9:15 AM. If you have any questions, ask your nurse or doctor.        aspirin 81 MG EC tablet Take 1 tablet (81 mg total) by mouth daily.   clopidogrel 75 MG tablet Commonly known as: PLAVIX Take 1 tablet by mouth once daily   ezetimibe 10 MG tablet Commonly known as: ZETIA Take 1 tablet (10 mg total) by mouth daily.   furosemide 40 MG tablet Commonly known as: LASIX Take 1 tablet (40 mg total) by mouth daily as needed (for leg swelling or  shortness of breath).   isosorbide mononitrate 30 MG 24 hr tablet Commonly known as: IMDUR Take 1 tablet by mouth once daily   metoprolol succinate 25 MG 24 hr tablet Commonly known as: TOPROL-XL Take 0.5 tablets (12.5 mg total) by mouth daily.   nitroGLYCERIN 0.4 MG SL tablet Commonly known as: NITROSTAT Place 1 tablet (0.4 mg total) under the tongue every 5 (five) minutes as needed for chest pain.   rosuvastatin 40 MG tablet Commonly known as: CRESTOR TAKE 1 TABLET BY MOUTH ONCE DAILY AT 6 IN THE EVENING  Vitamin D (Ergocalciferol) 1.25 MG (50000 UNIT) Caps capsule Commonly known as: DRISDOL Take 1 capsule (50,000 Units total) by mouth once a week.       No orders of the defined types were placed in this encounter.    Follow-up: No follow-ups on file.  Claretta Fraise, M.D.

## 2020-08-25 ENCOUNTER — Ambulatory Visit (INDEPENDENT_AMBULATORY_CARE_PROVIDER_SITE_OTHER): Payer: Medicare HMO

## 2020-08-25 DIAGNOSIS — Z0000 Encounter for general adult medical examination without abnormal findings: Secondary | ICD-10-CM

## 2020-08-25 LAB — CBC WITH DIFFERENTIAL/PLATELET
Basophils Absolute: 0 10*3/uL (ref 0.0–0.2)
Basos: 1 %
EOS (ABSOLUTE): 0.2 10*3/uL (ref 0.0–0.4)
Eos: 3 %
Hematocrit: 41.8 % (ref 37.5–51.0)
Hemoglobin: 14.3 g/dL (ref 13.0–17.7)
Immature Grans (Abs): 0 10*3/uL (ref 0.0–0.1)
Immature Granulocytes: 0 %
Lymphocytes Absolute: 1.5 10*3/uL (ref 0.7–3.1)
Lymphs: 29 %
MCH: 30.5 pg (ref 26.6–33.0)
MCHC: 34.2 g/dL (ref 31.5–35.7)
MCV: 89 fL (ref 79–97)
Monocytes Absolute: 0.5 10*3/uL (ref 0.1–0.9)
Monocytes: 9 %
Neutrophils Absolute: 3 10*3/uL (ref 1.4–7.0)
Neutrophils: 58 %
Platelets: 201 10*3/uL (ref 150–450)
RBC: 4.69 x10E6/uL (ref 4.14–5.80)
RDW: 12.7 % (ref 11.6–15.4)
WBC: 5.2 10*3/uL (ref 3.4–10.8)

## 2020-08-25 LAB — CMP14+EGFR
ALT: 12 IU/L (ref 0–44)
AST: 16 IU/L (ref 0–40)
Albumin/Globulin Ratio: 2.1 (ref 1.2–2.2)
Albumin: 4.4 g/dL (ref 3.7–4.7)
Alkaline Phosphatase: 107 IU/L (ref 44–121)
BUN/Creatinine Ratio: 17 (ref 10–24)
BUN: 22 mg/dL (ref 8–27)
Bilirubin Total: 1.2 mg/dL (ref 0.0–1.2)
CO2: 24 mmol/L (ref 20–29)
Calcium: 9.2 mg/dL (ref 8.6–10.2)
Chloride: 104 mmol/L (ref 96–106)
Creatinine, Ser: 1.28 mg/dL — ABNORMAL HIGH (ref 0.76–1.27)
Globulin, Total: 2.1 g/dL (ref 1.5–4.5)
Glucose: 96 mg/dL (ref 65–99)
Potassium: 4.4 mmol/L (ref 3.5–5.2)
Sodium: 142 mmol/L (ref 134–144)
Total Protein: 6.5 g/dL (ref 6.0–8.5)
eGFR: 57 mL/min/{1.73_m2} — ABNORMAL LOW (ref >59)

## 2020-08-25 LAB — LIPID PANEL
Chol/HDL Ratio: 3.1 ratio (ref 0.0–5.0)
Cholesterol, Total: 138 mg/dL (ref 100–199)
HDL: 45 mg/dL (ref >39)
LDL Chol Calc (NIH): 72 mg/dL (ref 0–99)
Triglycerides: 114 mg/dL (ref 0–149)
VLDL Cholesterol Cal: 21 mg/dL (ref 5–40)

## 2020-08-25 NOTE — Patient Instructions (Signed)
  [place] Maintenance Summary and Written Plan of Care  [name] ,  Thank you for allowing me to perform your Medicare Annual Wellness Visit and for your ongoing commitment to your health.   Health Maintenance & Immunization History Health Maintenance  Topic Date Due  . INFLUENZA VACCINE  12/07/2020  . TETANUS/TDAP  02/23/2030  . Hepatitis C Screening  Completed  . PNA vac Low Risk Adult  Completed  . HPV VACCINES  Aged Out  . COVID-19 Vaccine  Discontinued   Immunization History  Administered Date(s) Administered  . Influenza, High Dose Seasonal PF 02/09/2017, 01/24/2019  . Influenza-Unspecified 01/23/2020  . Pneumococcal Conjugate-13 07/23/2014  . Pneumococcal Polysaccharide-23 07/30/2011  . Tdap 02/24/2020    These are the patient goals that we discussed: Goals Addressed   None      This is a list of Health Maintenance Items that are overdue or due now: There are no preventive care reminders to display for this patient.   Orders/Referrals Placed Today: No orders of the defined types were placed in this encounter.  (Contact our referral department at [phone] if you have not spoken with someone about your referral appointment within the next 5 days)    Follow-up Plan  Scheduled with Dr. Livia Snellen 02/23/21 at 8:55am

## 2020-08-25 NOTE — Progress Notes (Signed)
Hello [name],  Your lab result is normal and/or stable.Some minor variations that are not significant are commonly marked abnormal, but do not represent any medical problem for you.  Best regards, Ruqaya Strauss, M.D.

## 2020-08-25 NOTE — Progress Notes (Signed)
MEDICARE ANNUAL WELLNESS VISIT  08/25/2020  Telephone Visit Disclaimer This Medicare AWV was conducted by telephone due to national recommendations for restrictions regarding the COVID-19 Pandemic (e.g. social distancing).  I verified, using two identifiers, that I am speaking with [name] or their authorized healthcare agent. I discussed the limitations, risks, security, and privacy concerns of performing an evaluation and management service by telephone and the potential availability of an in-person appointment in the future. The patient expressed understanding and agreed to proceed.  Location of Patient: Home Location of Provider (nurse):  [facility]  Subjective:    [name] is a 80 y.o. male patient of Stacks, Cletus Gash, MD who had a Medicare Annual Wellness Visit today via telephone. Skanda is Retired and lives with their spouse. he had 2 children.They have passed away. Patient has one stepson that lives with him and his wife. he reports that he is socially active and does interact with friends/family regularly. he is minimally physically active and enjoys working in the yard.  Patient Care Team: Claretta Fraise, MD as PCP - General (Family Medicine) Harl Bowie Alphonse Guild, MD as PCP - Cardiology (Cardiology) Ilean China, RN as Case Manager Delice Lesch, Lezlie Octave, MD as Consulting Physician (Neurology) Pleasant, Eppie Gibson, RN as [place] Management  Advanced Directives 08/25/2020 02/14/2020 07/24/2019 09/03/2018 07/23/2018 07/18/2018 07/18/2017  Does Patient Have a Medical Advance Directive? No No No No No No No  Would patient like information on creating a medical advance directive? No - Patient declined No - Patient declined No - Patient declined No - Patient declined No - Patient declined No - Patient declined No - Patient declined    Hospital Utilization Over the Past 12 Months: # of hospitalizations or ER visits: 0 # of surgeries:  0  Review of Systems    Patient reports that his overall health is unchanged compared to last year.  Patient Reported Readings (BP, Pulse, CBG, Weight, etc) none  Pain Assessment Pain : No/denies pain     Current Medications & Allergies (verified) Allergies as of 08/25/2020      Reactions   Brilinta [ticagrelor] Shortness Of Breath   Ibuprofen Nausea Only      Medication List       Accurate as of August 25, 2020  1:39 PM. If you have any questions, ask your nurse or doctor.        aspirin 81 MG EC tablet Take 1 tablet (81 mg total) by mouth daily.   clopidogrel 75 MG tablet Commonly known as: PLAVIX Take 1 tablet by mouth once daily   ezetimibe 10 MG tablet Commonly known as: ZETIA Take 1 tablet (10 mg total) by mouth daily.   furosemide 40 MG tablet Commonly known as: LASIX Take 1 tablet (40 mg total) by mouth daily as needed (for leg swelling or shortness of breath).   isosorbide mononitrate 30 MG 24 hr tablet Commonly known as: IMDUR Take 1 tablet by mouth once daily   metoprolol succinate 25 MG 24 hr tablet Commonly known as: TOPROL-XL Take 0.5 tablets (12.5 mg total) by mouth daily.   nitroGLYCERIN 0.4 MG SL tablet Commonly known as: NITROSTAT Place 1 tablet (0.4 mg total) under the tongue every 5 (five) minutes as needed for chest pain.   rosuvastatin 40 MG tablet Commonly known as: CRESTOR TAKE 1 TABLET BY MOUTH ONCE DAILY AT 6 IN THE EVENING   Vitamin D (Ergocalciferol) 1.25 MG (50000 UNIT)  Caps capsule Commonly known as: DRISDOL Take 1 capsule (50,000 Units total) by mouth once a week.       History (reviewed): Past Medical History:  Diagnosis Date  . Acute combined systolic and diastolic heart failure ([place] ) 07/19/2018  . Bradycardia   . CAD in native artery   . Foot drop, right foot   . History of GI bleed   . History of mitral valve prolapse   . History of peptic ulcer disease   . Hyperlipidemia, mixed   . Hypertension   . MI  (myocardial infarction) ([place]) 2000, 2004  . Neuromuscular disorder ([place])   . PUD, HX OF 01/03/2009   Qualifier: Diagnosis of  By: Orville Govern CMA, Carol    . STEMI involving left circumflex coronary artery ([place]) 07/18/2018   Past Surgical History:  Procedure Laterality Date  . CORONARY STENT INTERVENTION N/A 07/18/2018   Procedure: CORONARY STENT INTERVENTION;  Surgeon: Martinique, Peter M, MD;  Location: [facility];  Service: Cardiovascular;  Laterality: N/A;  . CORONARY STENT INTERVENTION N/A 07/20/2018   Procedure: CORONARY STENT INTERVENTION;  Surgeon: Martinique, Peter M, MD;  Location: [place] [facility];  Service: Cardiovascular;  Laterality: N/A;  . CORONARY STENT PLACEMENT  2004, 2007   5 in 2004, 1 in 2007  . CORONARY/GRAFT ACUTE MI REVASCULARIZATION N/A 07/18/2018   Procedure: Coronary/Graft Acute MI Revascularization;  Surgeon: Martinique, Peter M, MD;  Location: [facility];  Service: Cardiovascular;  Laterality: N/A;  . LEFT HEART CATH AND CORONARY ANGIOGRAPHY N/A 07/18/2018   Procedure: LEFT HEART CATH AND CORONARY ANGIOGRAPHY;  Surgeon: Martinique, Peter M, MD;  Location: [facility];  Service: Cardiovascular;  Laterality: N/A;   Family History  Problem Relation Age of Onset  . Heart attack Mother   . Heart attack Father   . Coronary artery disease Other   . Cancer Daughter    Social History   Socioeconomic History  . Marital status: Married    Spouse name: Mary  . Number of children: 2  . Years of education: 9  . Highest education level: GED or equivalent  Occupational History  . Occupation: Retired in 2006  Tobacco Use  . Smoking status: Former Smoker    Packs/day: 3.00    Years: 20.00    Pack years: 60.00    Types: Cigarettes    Quit date: 05/09/1976    Years since quitting: 44.3  . Smokeless tobacco: Never Used  Vaping Use  . Vaping Use: Never used  Substance and Sexual Activity  . Alcohol use: No  . Drug use: No  . Sexual activity: Not Currently     Partners: Female  Other Topics Concern  . Not on file  Social History Narrative   Married and lives at home with his wife.   He has two children.    He is retired but stays active at home.   Right handed   Social Determinants of Health   Financial Resource Strain: Not on file  Food Insecurity: Not on file  Transportation Needs: Not on file  Physical Activity: Not on file  Stress: Not on file  Social Connections: Not on file    Activities of Daily Living In your present state of health, do you have any difficulty performing the following activities: 08/25/2020  Hearing? N  Vision? N  Difficulty concentrating or making decisions? N  Walking or climbing stairs? N  Dressing or bathing? N  Doing errands, shopping? N  Preparing  Food and eating ? N  Using the Toilet? N  In the past six months, have you accidently leaked urine? N  Do you have problems with loss of bowel control? N  Managing your Medications? N  Managing your Finances? N  Housekeeping or managing your Housekeeping? N  Some recent data might be hidden    Patient Education/ Literacy How often do you need to have someone help you when you read instructions, pamphlets, or other written materials from your doctor or pharmacy?: 1 - Never What is the last grade level you completed in school?: GED  Exercise Current Exercise Habits: The patient does not participate in regular exercise at present  Diet Patient reports consuming 2 meals a day and 0 snack(s) a day Patient reports that his primary diet is: Regular Patient reports that she does have regular access to food.   Depression Screen PHQ 2/9 Scores 08/25/2020 08/24/2020 02/24/2020 10/11/2019 08/12/2019 07/24/2019 02/21/2019  PHQ - 2 Score 0 0 0 0 0 0 0     Fall Risk Fall Risk  08/25/2020 08/24/2020 08/24/2020 07/01/2020 02/24/2020  Falls in the past year? 1 1 0 1 1  Comment - - - - -  Number falls in past yr: 0 0 - 1 1  Comment - - - - -  Injury with Fall? 0 0 - 0 0   Comment - - - - -  Risk Factor Category  - - - - -  Risk for fall due to : History of fall(s) History of fall(s) - History of fall(s);Impaired balance/gait;Impaired mobility History of fall(s);Impaired balance/gait;Impaired mobility  Follow up Falls evaluation completed Falls evaluation completed - Falls evaluation completed Falls evaluation completed     Objective:  [name] seemed alert and oriented and he participated appropriately during our telephone visit.  Blood Pressure Weight BMI  BP Readings from Last 3 Encounters:  08/24/20 103/61  06/19/20 132/72  02/24/20 110/73   Wt Readings from Last 3 Encounters:  08/24/20 205 lb 6.4 oz (93.2 kg)  06/19/20 209 lb (94.8 kg)  02/24/20 206 lb 12.8 oz (93.8 kg)   BMI Readings from Last 1 Encounters:  08/24/20 26.02 kg/m    *Unable to obtain current vital signs, weight, and BMI due to telephone visit type  Hearing/Vision  . Draedyn did  seem to have difficulty with hearing/understanding during the telephone conversation . Reports that he has not had a formal eye exam by an eye care professional within the past year . Reports that he has not had a formal hearing evaluation within the past year *Unable to fully assess hearing and vision during telephone visit type  Cognitive Function: 6CIT Screen 08/25/2020 07/24/2019  What Year? 0 points 0 points  What month? 0 points 0 points  What time? 0 points 0 points  Count back from 20 0 points 0 points  Months in reverse 0 points 0 points  Repeat phrase 2 points 0 points  Total Score 2 0   (Normal:0-7, Significant for Dysfunction: >8)  Normal Cognitive Function Screening: Yes   Immunization & Health Maintenance Record Immunization History  Administered Date(s) Administered  . Influenza, High Dose Seasonal PF 02/09/2017, 01/24/2019  . Influenza-Unspecified 01/23/2020  . Pneumococcal Conjugate-13 07/23/2014  . Pneumococcal Polysaccharide-23 07/30/2011  . Tdap 02/24/2020     Health Maintenance  Topic Date Due  . INFLUENZA VACCINE  12/07/2020  . TETANUS/TDAP  02/23/2030  . Hepatitis C Screening  Completed  . PNA vac Low Risk Adult  Completed  . HPV VACCINES  Aged Out  . COVID-19 Vaccine  Discontinued       Assessment  This is a routine wellness examination for [name].  Health Maintenance: Due or Overdue There are no preventive care reminders to display for this patient.  [name] does not need a referral for Community Assistance: Care Management:   no Social Work:    no Prescription Assistance:  no Nutrition/Diabetes Education:  no   Plan:  Personalized Goals Goals Addressed            This Visit's Progress   . Exercise 150 min/wk Moderate Activity   Not on track   . Plan meals   Not on track    Plan to eat at least 2 meals a day with a few snacks during the day    . Prevent falls   On track    Move carefully and slowly. Wear your foot brace.       Personalized Health Maintenance & Screening Recommendations   Lung Cancer Screening Recommended: no (Low Dose CT Chest recommended if Age 56-80 years, 30 pack-year currently smoking OR have quit w/in past 15 years) Hepatitis C Screening recommended: no HIV Screening recommended: no  Advanced Directives: Written information was not prepared per patient's request.  Referrals & Orders No orders of the defined types were placed in this encounter.   Follow-up Plan . Follow-up with Claretta Fraise, MD as planned . Schedule 02/23/21    I have personally reviewed and noted the following in the patient's chart:   . Medical and social history . Use of alcohol, tobacco or illicit drugs  . Current medications and supplements . Functional ability and status . Nutritional status . Physical activity . Advanced directives . List of other physicians . Hospitalizations, surgeries, and ER visits in previous 12 months . Vitals . Screenings to include cognitive, depression, and  falls . Referrals and appointments  In addition, I have reviewed and discussed with [name] certain preventive protocols, quality metrics, and best practice recommendations. A written personalized care plan for preventive services as well as general preventive health recommendations is available and can be mailed to the patient at his request.      [facility]  [id number]

## 2020-12-18 ENCOUNTER — Other Ambulatory Visit: Payer: Self-pay

## 2020-12-18 ENCOUNTER — Ambulatory Visit: Payer: Medicare HMO | Admitting: Cardiology

## 2020-12-18 ENCOUNTER — Encounter: Payer: Self-pay | Admitting: Cardiology

## 2020-12-18 VITALS — BP 132/70 | HR 44 | Ht 74.5 in | Wt 195.0 lb

## 2020-12-18 DIAGNOSIS — I5032 Chronic diastolic (congestive) heart failure: Secondary | ICD-10-CM

## 2020-12-18 DIAGNOSIS — R001 Bradycardia, unspecified: Secondary | ICD-10-CM

## 2020-12-18 DIAGNOSIS — I2510 Atherosclerotic heart disease of native coronary artery without angina pectoris: Secondary | ICD-10-CM | POA: Diagnosis not present

## 2020-12-18 DIAGNOSIS — I10 Essential (primary) hypertension: Secondary | ICD-10-CM

## 2020-12-18 DIAGNOSIS — E782 Mixed hyperlipidemia: Secondary | ICD-10-CM | POA: Diagnosis not present

## 2020-12-18 MED ORDER — NITROGLYCERIN 0.4 MG SL SUBL: 0.4000 mg | SUBLINGUAL_TABLET | SUBLINGUAL | 3 refills | Status: AC | PRN

## 2020-12-18 NOTE — Progress Notes (Signed)
Clinical Summary [name] is a 80 y.o.male seen today for follow up of the following medical problems.    1. CAD - multiple PCIs in the past - hx of MI in 2004, with stenting to RCA (ostium down to PDA). Of note had GI bleed 2 months later on DAPT and NSAIDs requiring transfusion.   - cath from 2007 with occluded LCX OM, received a BMS. RCA stents were patent at that time. LVEF 70% by LV gram at that time, no echoes in our system     - admit 07/2018 with acute posterior STEMI, taken to cath lab - 07/2018 cath showed ostal LAD 60%, mid LAD 99%, D1 95%, OM1 occluded, RCA 30%, mid RCA 95%. Received DES to OM1, then staged PCI to RCA. From notes no options on treating LAD, diffusely diseased and no target for CABG. Recs for indefinitie DAPT. LVEDP 29, limited diuresis due to uptrend in Cr.   -  changed from brillinta to plavix due to SOB      - no recent chest pains. Some improvement in breathing since last visit, has some chronic DOE - compliant with meds     2. Chronic diastolic HF - no recent edema - has prn lasix, uses infrequently   3. HTN - he is compliant with meds   4. Hyperlipidemia   - 02/2020 TC 142 TG 87 HDL 51 LDL 74 - 08/2020 TC 138 TG 114 HL 45 LDL 72 - he is on crestor 40, zetia 10   5. Chronic bradycardia - some dizzienss at times, often with standing.    Past Medical History:  Diagnosis Date   Acute combined systolic and diastolic heart failure ([place]) 07/19/2018   Bradycardia    CAD in native artery    Foot drop, right foot    History of GI bleed    History of mitral valve prolapse    History of peptic ulcer disease    Hyperlipidemia, mixed    Hypertension    MI (myocardial infarction) ([place]) 2000, 2004   Neuromuscular disorder ([place])    PUD, HX OF 01/03/2009   Qualifier: Diagnosis of  By: Orville Govern CMA, Carol     STEMI involving left circumflex coronary artery ([place]) 07/18/2018     Allergies  Allergen Reactions   Brilinta [Ticagrelor] Shortness Of  Breath   Ibuprofen Nausea Only     Current Outpatient Medications  Medication Sig Dispense Refill   aspirin EC 81 MG EC tablet Take 1 tablet (81 mg total) by mouth daily.     clopidogrel (PLAVIX) 75 MG tablet Take 1 tablet by mouth once daily 90 tablet 3   ezetimibe (ZETIA) 10 MG tablet Take 1 tablet (10 mg total) by mouth daily. 90 tablet 1   furosemide (LASIX) 40 MG tablet Take 1 tablet (40 mg total) by mouth daily as needed (for leg swelling or shortness of breath). 90 tablet 1   isosorbide mononitrate (IMDUR) 30 MG 24 hr tablet Take 1 tablet by mouth once daily 90 tablet 3   metoprolol succinate (TOPROL-XL) 25 MG 24 hr tablet Take 0.5 tablets (12.5 mg total) by mouth daily. 45 tablet 1   nitroGLYCERIN (NITROSTAT) 0.4 MG SL tablet Place 1 tablet (0.4 mg total) under the tongue every 5 (five) minutes as needed for chest pain. 25 tablet 3   rosuvastatin (CRESTOR) 40 MG tablet TAKE 1 TABLET BY MOUTH ONCE DAILY AT 6 IN THE EVENING 90 tablet 1   Vitamin D,  Ergocalciferol, (DRISDOL) 1.25 MG (50000 UNIT) CAPS capsule Take 1 capsule (50,000 Units total) by mouth once a week. 13 capsule 0   No current facility-administered medications for this visit.     Past Surgical History:  Procedure Laterality Date   CORONARY STENT INTERVENTION N/A 07/18/2018   Procedure: CORONARY STENT INTERVENTION;  Surgeon: Martinique, Peter M, MD;  Location: [facility];  Service: Cardiovascular;  Laterality: N/A;   CORONARY STENT INTERVENTION N/A 07/20/2018   Procedure: CORONARY STENT INTERVENTION;  Surgeon: Martinique, Peter M, MD;  Location: [facility];  Service: Cardiovascular;  Laterality: N/A;   CORONARY STENT PLACEMENT  2004, 2007   5 in 2004, 1 in 2007   CORONARY/GRAFT ACUTE MI REVASCULARIZATION N/A 07/18/2018   Procedure: Coronary/Graft Acute MI Revascularization;  Surgeon: Martinique, Peter M, MD;  Location: [facility];  Service: Cardiovascular;  Laterality: N/A;   LEFT HEART CATH AND CORONARY  ANGIOGRAPHY N/A 07/18/2018   Procedure: LEFT HEART CATH AND CORONARY ANGIOGRAPHY;  Surgeon: Martinique, Peter M, MD;  Location: [facility];  Service: Cardiovascular;  Laterality: N/A;     Allergies  Allergen Reactions   Brilinta [Ticagrelor] Shortness Of Breath   Ibuprofen Nausea Only      Family History  Problem Relation Age of Onset   Heart attack Mother    Heart attack Father    Coronary artery disease Other    Cancer Daughter      Social History [name] reports that he quit smoking about 44 years ago. His smoking use included cigarettes. He has a 60.00 pack-year smoking history. He has never used smokeless tobacco. [name] reports no history of alcohol use.   Review of Systems CONSTITUTIONAL: No weight loss, fever, chills, weakness or fatigue.  HEENT: Eyes: No visual loss, blurred vision, double vision or yellow sclerae.No hearing loss, sneezing, congestion, runny nose or sore throat.  SKIN: No rash or itching.  CARDIOVASCULAR: per hpi RESPIRATORY: per hpi GASTROINTESTINAL: No anorexia, nausea, vomiting or diarrhea. No abdominal pain or blood.  GENITOURINARY: No burning on urination, no polyuria NEUROLOGICAL:per hpi MUSCULOSKELETAL: No muscle, back pain, joint pain or stiffness.  LYMPHATICS: No enlarged nodes. No history of splenectomy.  PSYCHIATRIC: No history of depression or anxiety.  ENDOCRINOLOGIC: No reports of sweating, cold or heat intolerance. No polyuria or polydipsia.  [name]   Physical Examination Today's Vitals   12/18/20 1003  BP: 132/70  Pulse: (!) 44  SpO2: 98%  Weight: 195 lb (88.5 kg)  Height: 6' 2.5" (1.892 m)   Body mass index is 24.7 kg/m.  Gen: resting comfortably, no acute distress HEENT: no scleral icterus, pupils equal round and reactive, no palptable cervical adenopathy,  CV: regular, brady, no m/r/g, no jvd Resp: Clear to auscultation bilaterally GI: abdomen is soft, non-tender, non-distended, normal bowel sounds, no  hepatosplenomegaly MSK: extremities are warm, no edema.  Skin: warm, no rash Neuro:  no focal deficits Psych: appropriate affect   Diagnostic Studies  Cath 2007 Central aortic pressure is 101/68 with a mean of 84. LV pressure is 94/1   with an EDP of 6. There is no aortic stenosis.   Left main was normal.   LAD was a large vessel coursing to the apex. There was very prominent   severe ectasia in the proximal portion with aneurysmal dilatation and   swirling flow. However, no high grade stenosis. In the mid LAD, there was   a diffuse 40-50% segmental plaquing. In the distal LAD,  there was a tubular   70-80% stenosis. There also were two diagonals. The first diagonal is a   medium vessel with a long 90% stenosis. The second diagonal was small with   branching vessel with a 70-80% proximal lesion.   The left circumflex was a moderate sized system. It gave off a large   branching OM-1 and a small OM-2. There is a 40% proximal lesion in the   circumflex. The lower [place] Toral of the OM was totally occluded and the distal   part of the vessel filled through collaterals with TIMI I flow. There is a   40% blockage in the upper portion of the OM.   The right coronary artery was a large dominant vessel. It gave off a   moderate sized PDA and two small PLs. There was evidence of previously   placed stent from the ostial portion down to the PDA. There was 40% ostial   stenosis with damping of the 6-French catheter. This did not change with   sublingual nitroglycerin. There is a 30% lesion in the proximal to mid   portion.   Left ventriculogram done the RAO position showed an EF of 70% with mild   mitral valve prolapse. There was no wall motion abnormalities and there was   no mitral regurgitation. Left ventriculogram done in the LAO position   showed an EF of 70% with no regional wall motion abnormalities.   Abdominal aortogram showed patent renal arteries bilaterally with no   abdominal aortic  aneurysm.   A left subclavian done for possible bypass surgery showed a patent LIMA to   the chest wall with no obstructions in the subclavian.   ASSESSMENT:   1. Three vessel native coronary artery disease.   2. The LAD has diffuse ectasia and swelling flow proximally as described   above. There is a borderline lesion in the distal LAD and a high grade   disease in the diagonal, but these do not appear to be the culprit   lesion.   3. Totally occluded OM1 which appears to be the culprit.   4. Patent right coronary stents.   5. Normal LV function with mild mitral valve prolapse but no significant   mitral regurgitation.   CONCLUSION: Successful PCI of a totally occluded circumflex marginal vessel   with improvement in narrowing from 100% to 0% and improvement in flow from   TIMI 0 to TIMI III flow using a bare metal stent.     07/2018 echo IMPRESSIONS      1. The left ventricle has low normal systolic function, with an ejection fraction of 50-55%. The cavity size was normal. Left ventricular diastolic Doppler parameters are indeterminate.  2. Mild hypokinesis of the left ventricular, mid-apical inferoseptal wall, inferior segment and inferolateral wall.  3. The right ventricle has normal systolic function. The cavity was normal. There is no increase in right ventricular wall thickness.  4. The aortic valve is tricuspid Mild thickening of the aortic valve Mild calcification of the aortic valve. Aortic valve regurgitation was not assessed by color flow Doppler.     07/2018 cath Previously placed Mid RCA to Dist RCA stent (unknown type) is widely patent. Ost RCA to Mid RCA lesion is 30% stenosed. Mid RCA lesion is 95% stenosed. Ost 1st Mrg to 1st Mrg lesion is 100% stenosed. Ost LAD to Prox LAD lesion is 60% stenosed. Prox LAD lesion is 25% stenosed. Ost 1st Diag to 1st Diag lesion is 95% stenosed.  Mid LAD to Dist LAD lesion is 99% stenosed. Post intervention, there is a 0% residual  stenosis. A drug-eluting stent was successfully placed using a STENT SYNERGY DES 3X24. LV end diastolic pressure is moderately elevated.   1. 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 Diffuse 99% stenosis in the mid to distal LAD. 95% bifurcating first diagonal.    - 100% occlusion of the large first OM at site of prior stent. This is the culprit lesion   - 95% mid RCA in stent 2. Moderately elevated LVEDP 3. Successful PCI of the OM 1 with DES x1   Discussed with Dr. Gwenlyn Found. There are no options for treating the LAD. It is diffusely diseased and there is no target for CABG. There are some right to left collaterals to the distal LAD. We proceeded with emergent PCI of the OM1. Recommend DAPT indefinitely given multiple layers of stent. Plan to bring patient back in 2 days for PCI of the RCA. Will treat the LAD medically. Check LV function by Echo. Maximize medical therapy. Stress importance of medication compliance.      07/2018 staged PCI Mid RCA lesion is 95% stenosed. A drug-eluting stent was successfully placed using a STENT SYNERGY DES 3.5X16. Post intervention, there is a 0% residual stenosis.   1. Successful PCI of the mid RCA with DES x 1.   Plan: continue DAPT indefinitely. Anticipate DC home in am.       Assessment and Plan  1. CAD - he is committed to indefinite DAPT as reported above - no symptoms, continue current meds. Stop toprol due to bradycardia.    2. Chronic diastolic HF - no symptoms, euvolemic today, continue prn lasix   3. HTN - at goal, continue current meds   4. Hyperlipidemia - at goal essentialyl on crestor 40 and zetia 10, continue current meds   5. Bradycardia - EKG today shows sinus brady 44 - we will d/c his toprol   F/u 6 months      Arnoldo Lenis, M.D.

## 2020-12-18 NOTE — Patient Instructions (Signed)
Medication Instructions:  Stop Toprol XL (Metoprolol Succ.). Continue all other medications.     Labwork: none  Testing/Procedures: none  Follow-Up: 6 months   Any Other Special Instructions Will Be Listed Below (If Applicable).  If you need a refill on your cardiac medications before your next appointment, please call your pharmacy.

## 2020-12-20 ENCOUNTER — Other Ambulatory Visit: Payer: Self-pay | Admitting: Cardiology

## 2020-12-29 ENCOUNTER — Other Ambulatory Visit: Payer: Self-pay | Admitting: *Deleted

## 2020-12-29 NOTE — Patient Outreach (Signed)
[facility]) Care Management  [place]  12/29/2020   [name] February 05, 1941 [id number]  RN Health Coach telephone call to patient.  Hipaa compliance verified. Per patient his weight stays around 195. Patient has not been monitoring his blood pressure and guide. Patient Dr stopped the patient Toprol. RN explained to patient the importance of monitoring his BP and pulse since medication stopped. Per patient he is taking his medications as prescribed. Patient has agreed to further outreach calls.   Encounter Medications:  Outpatient Encounter Medications as of 12/29/2020  Medication Sig   aspirin EC 81 MG EC tablet Take 1 tablet (81 mg total) by mouth daily.   clopidogrel (PLAVIX) 75 MG tablet Take 1 tablet by mouth once daily   ezetimibe (ZETIA) 10 MG tablet Take 1 tablet (10 mg total) by mouth daily.   furosemide (LASIX) 40 MG tablet Take 40 mg by mouth as needed for fluid or edema.   isosorbide mononitrate (IMDUR) 30 MG 24 hr tablet Take 1 tablet by mouth once daily   nitroGLYCERIN (NITROSTAT) 0.4 MG SL tablet Place 1 tablet (0.4 mg total) under the tongue every 5 (five) minutes as needed for chest pain.   rosuvastatin (CRESTOR) 40 MG tablet TAKE 1 TABLET BY MOUTH ONCE DAILY AT 6 PM IN THE EVENING   Vitamin D, Ergocalciferol, (DRISDOL) 1.25 MG (50000 UNIT) CAPS capsule Take 1 capsule (50,000 Units total) by mouth once a week.   No facility-administered encounter medications on file as of 12/29/2020.    Functional Status:  In your present state of health, do you have any difficulty performing the following activities: 08/25/2020  Hearing? N  Vision? N  Difficulty concentrating or making decisions? N  Walking or climbing stairs? N  Dressing or bathing? N  Doing errands, shopping? N  Preparing Food and eating ? N  Using the Toilet? N  In the past six months, have you accidently leaked urine? N  Do you have problems with loss of bowel control? N  Managing  your Medications? N  Managing your Finances? N  Housekeeping or managing your Housekeeping? N  Some recent data might be hidden    Fall/Depression Screening: Fall Risk  12/29/2020 08/25/2020 08/24/2020  Falls in the past year? '1 1 1  '$ Comment - - -  Number falls in past yr: 1 0 0  Comment - - -  Injury with Fall? 0 0 0  Comment - - -  Risk Factor Category  - - -  Risk for fall due to : History of fall(s);Impaired balance/gait History of fall(s) History of fall(s)  Follow up Falls evaluation completed Falls evaluation completed Falls evaluation completed   PHQ 2/9 Scores 08/25/2020 08/24/2020 02/24/2020 10/11/2019 08/12/2019 07/24/2019 02/21/2019  PHQ - 2 Score 0 0 0 0 0 0 0    Assessment:   Care Plan Care Plan : Heart Failure (Adult)  Updates made by Verlin Grills, RN since 12/29/2020 12:00 AM     Problem: Symptom Exacerbation (Heart Failure)   Priority: High  Onset Date: 02/14/2020     Long-Range Goal: Symptom Exacerbation Prevented or Minimized   Start Date: 02/14/2020  Expected End Date: 05/07/2021  This Visit's Progress: On track  Recent Progress: On track  Priority: Medium  Note:   Evidence-based guidance:  Perform or review cognitive and/or health literacy screening.  Assess understanding of adherence and barriers to treatment plan, as well as lifestyle changes; develop strategies to address barriers.  Establish a  mutually-agreed-upon early intervention process to communicate with primary care provider when signs/symptoms worsen.  Facilitate timely posthospital discharge or emergency department treatment that includes intensive follow-up via telephone calls, home visit, telehealth monitoring and care at multidisciplinary heart failure clinic.  Adjust frequency and intensity of follow-up based on presentation, number of emergency department visits, hospital admissions and frequency and severity of symptom exacerbation.  Facilitate timely visit, usually within 1 week, with  primary care provider following hospital discharge.  Collaborate with clinical pharmacist to address adverse drug reactions, drug interactions, subtherapeutic dosage, patient and family education.  Regularly screen for presence of depressive symptoms using a validated tool; consider pharmacologic therapy and/or referral for cognitive behavioral therapy when present.  Refer to community-based services, such as a heart failure support group, community Economist or peer support program.  Review immunization status; arrange receipt of needed vaccinations.  Prepare patient for home oxygen use based on signs/symptoms.   Notes:  [id number] Patient is monitoring symptoms    Problem: Disease Progression (Heart Failure)   Priority: Medium  Onset Date: 01/05/2021     Long-Range Goal: Comorbidities Identified and Managed   Start Date: 02/14/2020  Expected End Date: 05/07/2021  This Visit's Progress: On track  Recent Progress: On track  Priority: Medium  Note:   Evidence-based guidance:  Assess and address signs/symptoms of comorbidity, including dyslipidemia, diabetes, iron deficiency, gout, arthritis, dysrhythmia, hypertension, cachexia, coronary artery disease, kidney dysfunction and lung disease.  Prepare patient for laboratory and diagnostic exams based on risk and presentation.  Prepare for use of pharmacologic therapy that may include statin, angiotensin converting enzyme (ACE) inhibitor, angiotensin receptor blocker (ARB), beta-blocker, digoxin, antidysrhythmic, diuretic or omega-3 fatty acid.  Monitor side effects and anticipate need for periodic adjustments.  Prepare patient for potential invasive treatment, such as implantable cardioverter-defibrillator, cardiac resynchronization therapy or heart transplant as disease progresses.   Notes:     Problem: Activity Tolerance (Heart Failure)   Priority: Medium  Onset Date: 01/05/2021     Long-Range Goal: Activity Tolerance Optimized    Start Date: 02/14/2020  Expected End Date: 05/07/2021  This Visit's Progress: On track  Recent Progress: On track  Priority: Medium  Note:   Evidence-based guidance:  Promote daily physical activity that improves functional ability, cognition and quality of life.  Encourage reduction in sedentary time.   Encourage optimal, safe functional mobility and self-care performance based on ability and tolerance.   Promote breathing and energy conservation techniques, such as pursed-lip breathing, preplanning and pacing of activity, balancing activity and rest.  Encourage participation in cardiac rehabilitation services.   Notes:  [id number] Patient is active mowing 2 yards      Goals Addressed             This Visit's Progress    [facility]) Learn More About My Health   On track    Timeframe:  Long-Range Goal Priority:  Medium Start Date:      [id number]                       Expected End Date:      [id number]                Follow Up Date [id number]   - tell my story and reason for my visit - make a list of questions - ask questions - repeat what I heard to make sure I understand - bring a list of my medicines to the visit - speak up  when I don't understand    Why is this important?   The best way to learn about your health and care is by talking to the doctor and nurse.  They will answer your questions and give you information in the way that you like best.    Notes:  [id number] RN discussed the importance of monitoring blood pressure and pulse since the Toprol was d/cd     [facility]) Track and Manage Activity and Exertion   On track    Timeframe:  Long-Range Goal Priority:  Medium Start Date:     [id number]                        Expected End Date:    [id number]                  Follow Up Date [id number]   - avoid heavy exercise on very hot days - make an activity or exercise plan - pace activity allowing for rest - track symptoms during activity in diary    Why is this important?    Exercising is very important when managing your heart failure.  It will help your heart get stronger.    Notes:  [id number] Patient is mowing 2 yards      [facility]) Track and Manage Fluids and Swelling   On track    Timeframe:  Long-Range Goal Priority:  Medium Start Date:    [id number]                         Expected End Date:     [id number]                 Follow Up Date [id number]   - call office if I gain more than 2 pounds in one day or 5 pounds in one week - keep legs up while sitting - track weight in diary - use salt in moderation - watch for swelling in feet, ankles and legs every day - weigh myself daily    Why is this important?   It is important to check your weight daily and watch how much salt and liquids you have.  It will help you to manage your heart failure.    Notes:         Plan:  Follow-up: Follow-up in 3 month(s) RN provided a Blood pressure monitor RN provided educational material on How to use a BP monitor Patient will monitor blood pressure, wt and pulse RN provided Ensure coupons RN sent update assessment to PCP  [place] Management [phone]

## 2020-12-29 NOTE — Patient Instructions (Signed)
Goals Addressed             This Visit's Progress    [facility]) Learn More About My Health   On track    Timeframe:  Long-Range Goal Priority:  Medium Start Date:      [id number]                       Expected End Date:      [id number]                Follow Up Date [id number]   - tell my story and reason for my visit - make a list of questions - ask questions - repeat what I heard to make sure I understand - bring a list of my medicines to the visit - speak up when I don't understand    Why is this important?   The best way to learn about your health and care is by talking to the doctor and nurse.  They will answer your questions and give you information in the way that you like best.    Notes:  [id number] RN discussed the importance of monitoring blood pressure and pulse since the Toprol was d/cd     [facility]) Track and Manage Activity and Exertion   On track    Timeframe:  Long-Range Goal Priority:  Medium Start Date:     [id number]                        Expected End Date:    [id number]                  Follow Up Date [id number]   - avoid heavy exercise on very hot days - make an activity or exercise plan - pace activity allowing for rest - track symptoms during activity in diary    Why is this important?   Exercising is very important when managing your heart failure.  It will help your heart get stronger.    Notes:  [id number] Patient is mowing 2 yards      [facility]) Track and Manage Fluids and Swelling   On track    Timeframe:  Long-Range Goal Priority:  Medium Start Date:    [id number]                         Expected End Date:     [id number]                 Follow Up Date [id number]   - call office if I gain more than 2 pounds in one day or 5 pounds in one week - keep legs up while sitting - track weight in diary - use salt in moderation - watch for swelling in feet, ankles and legs every day - weigh myself daily    Why is this important?   It is important to check your weight  daily and watch how much salt and liquids you have.  It will help you to manage your heart failure.    Notes:

## 2021-02-22 ENCOUNTER — Other Ambulatory Visit: Payer: Self-pay | Admitting: Cardiology

## 2021-02-23 ENCOUNTER — Ambulatory Visit (INDEPENDENT_AMBULATORY_CARE_PROVIDER_SITE_OTHER): Payer: Medicare HMO | Admitting: Family Medicine

## 2021-02-23 ENCOUNTER — Other Ambulatory Visit: Payer: Self-pay

## 2021-02-23 ENCOUNTER — Encounter: Payer: Self-pay | Admitting: Family Medicine

## 2021-02-23 VITALS — BP 142/78 | HR 52 | Temp 97.6°F | Resp 20 | Ht 74.5 in | Wt 208.0 lb

## 2021-02-23 DIAGNOSIS — E785 Hyperlipidemia, unspecified: Secondary | ICD-10-CM

## 2021-02-23 DIAGNOSIS — R040 Epistaxis: Secondary | ICD-10-CM

## 2021-02-23 DIAGNOSIS — I10 Essential (primary) hypertension: Secondary | ICD-10-CM | POA: Diagnosis not present

## 2021-02-23 DIAGNOSIS — I2510 Atherosclerotic heart disease of native coronary artery without angina pectoris: Secondary | ICD-10-CM | POA: Diagnosis not present

## 2021-02-23 MED ORDER — ASPIRIN 81 MG PO TBEC: 81.0000 mg | DELAYED_RELEASE_TABLET | ORAL | Status: AC

## 2021-02-23 NOTE — Progress Notes (Signed)
Subjective:  Patient ID: [name], male    DOB: Nov 08, 1940  Age: 80 y.o. MRN: [id number]  CC: Medical Management of Chronic Issues (6 Mo )   HPI [name] presents for follow-up of hypertension. Patient has no history of headache chest pain or shortness of breath or recent cough. Patient also denies symptoms of TIA such as numbness weakness lateralizing. Patient checks  blood pressure at home and has not had any elevated readings recently. Patient denies side effects from his medication. States taking it regularly. [name]  He also is taking Plavix for his coronary artery disease.  He has had stents placed on multiple occasions as well as an MI.  He is also taking daily low-dose aspirin 81 mg/day.  He experienced a nosebleed for about an hour and a half 2 days ago in the afternoon.  Last night at bedtime he noted a brief episode as well lasting less than 10 minutes.  He has had these before.  They occur perhaps once or twice a month.  Never as long as the one that occurred 2 days ago. Increased bruising on arms.   Depression screen [facility] 2/9 02/23/2021 08/25/2020 08/24/2020  Decreased Interest 0 0 0  Down, Depressed, Hopeless 0 0 0  PHQ - 2 Score 0 0 0    History Nash has a past medical history of Acute combined systolic and diastolic heart failure (HCC) (07/19/2018), Bradycardia, CAD in native artery, Foot drop, right foot, History of GI bleed, History of mitral valve prolapse, History of peptic ulcer disease, Hyperlipidemia, mixed, Hypertension, MI (myocardial infarction) ([employer]) (2000, 2004), Neuromuscular disorder ([place]), PUD, HX OF (01/03/2009), and STEMI involving left circumflex coronary artery ([place]) (07/18/2018).   He has a past surgical history that includes Coronary stent placement (2004, 2007); LEFT HEART CATH AND CORONARY ANGIOGRAPHY (N/A, 07/18/2018); Coronary/Graft Acute MI Revascularization (N/A, 07/18/2018); CORONARY STENT INTERVENTION (N/A, 07/18/2018); and CORONARY STENT INTERVENTION  (N/A, 07/20/2018).   His family history includes Cancer in his daughter; Coronary artery disease in an other family member; Heart attack in his father and mother.He reports that he quit smoking about 44 years ago. His smoking use included cigarettes. He has a 60.00 pack-year smoking history. He has never used smokeless tobacco. He reports that he does not drink alcohol and does not use drugs.    ROS Review of Systems  Constitutional:  Negative for fever.  HENT:  Positive for nosebleeds.   Respiratory:  Positive for shortness of breath (with strenuous exertion).   Cardiovascular:  Negative for chest pain.  Musculoskeletal:  Negative for arthralgias.  Skin:  Negative for rash.   Objective:  BP (!) 142/78   Pulse (!) 52   Temp 97.6 F (36.4 C)   Resp 20   Ht 6' 2.5" (1.892 m)   Wt 208 lb (94.3 kg)   SpO2 98%   BMI 26.35 kg/m   BP Readings from Last 3 Encounters:  02/23/21 (!) 142/78  12/18/20 132/70  08/24/20 103/61    Wt Readings from Last 3 Encounters:  02/23/21 208 lb (94.3 kg)  12/18/20 195 lb (88.5 kg)  08/24/20 205 lb 6.4 oz (93.2 kg)     Physical Exam Vitals reviewed.  Constitutional:      Appearance: He is well-developed.  HENT:     Head: Normocephalic and atraumatic.     Right Ear: External ear normal.     Left Ear: External ear normal.     Mouth/Throat:     Pharynx:  No oropharyngeal exudate or posterior oropharyngeal erythema.  Eyes:     Pupils: Pupils are equal, round, and reactive to light.  Cardiovascular:     Rate and Rhythm: Normal rate and regular rhythm.     Heart sounds: No murmur heard. Pulmonary:     Effort: No respiratory distress.     Breath sounds: Normal breath sounds.  Musculoskeletal:     Cervical back: Normal range of motion and neck supple.  Neurological:     Mental Status: He is alert and oriented to person, place, and time.      Assessment & Plan:   Bridger was seen today for medical management of chronic issues.  Diagnoses  and all orders for this visit:  Essential hypertension, benign -     CBC with Differential/Platelet -     CMP14+EGFR -     Lipid panel  Hyperlipidemia with target LDL less than 70 -     CBC with Differential/Platelet -     CMP14+EGFR -     Lipid panel  ASCVD (arteriosclerotic cardiovascular disease) -     CBC with Differential/Platelet -     CMP14+EGFR -     Lipid panel  Epistaxis  Other orders -     aspirin 81 MG EC tablet; Take 1 tablet (81 mg total) by mouth every other day.      I have changed Alonte R. Kamm's aspirin. I am also having him maintain his clopidogrel, Vitamin D (Ergocalciferol), isosorbide mononitrate, furosemide, nitroGLYCERIN, rosuvastatin, and ezetimibe.  Allergies as of 02/23/2021       Reactions   Brilinta [ticagrelor] Shortness Of Breath   Ibuprofen Nausea Only        Medication List        Accurate as of February 23, 2021  2:50 PM. If you have any questions, ask your nurse or doctor.          aspirin 81 MG EC tablet Take 1 tablet (81 mg total) by mouth every other day. What changed: when to take this Changed by: Claretta Fraise, MD   clopidogrel 75 MG tablet Commonly known as: PLAVIX Take 1 tablet by mouth once daily   ezetimibe 10 MG tablet Commonly known as: ZETIA Take 1 tablet by mouth once daily   furosemide 40 MG tablet Commonly known as: LASIX Take 40 mg by mouth as needed for fluid or edema.   isosorbide mononitrate 30 MG 24 hr tablet Commonly known as: IMDUR Take 1 tablet by mouth once daily   nitroGLYCERIN 0.4 MG SL tablet Commonly known as: NITROSTAT Place 1 tablet (0.4 mg total) under the tongue every 5 (five) minutes as needed for chest pain.   rosuvastatin 40 MG tablet Commonly known as: CRESTOR TAKE 1 TABLET BY MOUTH ONCE DAILY AT 6 PM IN THE EVENING   Vitamin D (Ergocalciferol) 1.25 MG (50000 UNIT) Caps capsule Commonly known as: DRISDOL Take 1 capsule (50,000 Units total) by mouth once a week.          Follow-up: Return in about 6 months (around 08/24/2021), or if symptoms worsen or fail to improve, for [employer] physical.  Claretta Fraise, M.D.

## 2021-02-24 LAB — LIPID PANEL
Chol/HDL Ratio: 3.1 ratio (ref 0.0–5.0)
Cholesterol, Total: 160 mg/dL (ref 100–199)
HDL: 52 mg/dL (ref >39)
LDL Chol Calc (NIH): 89 mg/dL (ref 0–99)
Triglycerides: 107 mg/dL (ref 0–149)
VLDL Cholesterol Cal: 19 mg/dL (ref 5–40)

## 2021-02-24 LAB — CMP14+EGFR
ALT: 14 IU/L (ref 0–44)
AST: 18 IU/L (ref 0–40)
Albumin/Globulin Ratio: 2.3 — ABNORMAL HIGH (ref 1.2–2.2)
Albumin: 4.4 g/dL (ref 3.7–4.7)
Alkaline Phosphatase: 99 IU/L (ref 44–121)
BUN/Creatinine Ratio: 15 (ref 10–24)
BUN: 17 mg/dL (ref 8–27)
Bilirubin Total: 1.2 mg/dL (ref 0.0–1.2)
CO2: 22 mmol/L (ref 20–29)
Calcium: 9 mg/dL (ref 8.6–10.2)
Chloride: 107 mmol/L — ABNORMAL HIGH (ref 96–106)
Creatinine, Ser: 1.17 mg/dL (ref 0.76–1.27)
Globulin, Total: 1.9 g/dL (ref 1.5–4.5)
Glucose: 106 mg/dL — ABNORMAL HIGH (ref 70–99)
Potassium: 4 mmol/L (ref 3.5–5.2)
Sodium: 145 mmol/L — ABNORMAL HIGH (ref 134–144)
Total Protein: 6.3 g/dL (ref 6.0–8.5)
eGFR: 63 mL/min/{1.73_m2} (ref >59)

## 2021-02-24 LAB — CBC WITH DIFFERENTIAL/PLATELET
Basophils Absolute: 0 10*3/uL (ref 0.0–0.2)
Basos: 1 %
EOS (ABSOLUTE): 0.1 10*3/uL (ref 0.0–0.4)
Eos: 3 %
Hematocrit: 42.9 % (ref 37.5–51.0)
Hemoglobin: 14.6 g/dL (ref 13.0–17.7)
Immature Grans (Abs): 0 10*3/uL (ref 0.0–0.1)
Immature Granulocytes: 0 %
Lymphocytes Absolute: 1.4 10*3/uL (ref 0.7–3.1)
Lymphs: 31 %
MCH: 30.5 pg (ref 26.6–33.0)
MCHC: 34 g/dL (ref 31.5–35.7)
MCV: 90 fL (ref 79–97)
Monocytes Absolute: 0.4 10*3/uL (ref 0.1–0.9)
Monocytes: 9 %
Neutrophils Absolute: 2.5 10*3/uL (ref 1.4–7.0)
Neutrophils: 56 %
Platelets: 191 10*3/uL (ref 150–450)
RBC: 4.79 x10E6/uL (ref 4.14–5.80)
RDW: 12.7 % (ref 11.6–15.4)
WBC: 4.4 10*3/uL (ref 3.4–10.8)

## 2021-02-24 NOTE — Progress Notes (Signed)
Hello Donna,  Your lab result is normal and/or stable.Some minor variations that are not significant are commonly marked abnormal, but do not represent any medical problem for you.  Best regards, Abdul Beirne, M.D.

## 2021-04-05 ENCOUNTER — Ambulatory Visit: Payer: Medicare HMO | Admitting: *Deleted

## 2021-04-08 ENCOUNTER — Other Ambulatory Visit: Payer: Self-pay | Admitting: *Deleted

## 2021-04-08 NOTE — Patient Outreach (Addendum)
[facility]) Care Management  04/08/2021  [name] 1941-02-06 [id number]   RN Health Coach telephone call to patient.  Patient unavailable. Hipaa compliance message left with wife.  Plan: Will follow up outreach within the next 30 days.  [place] Care Management [phone]

## 2021-04-09 ENCOUNTER — Other Ambulatory Visit: Payer: Self-pay | Admitting: *Deleted

## 2021-04-09 NOTE — Patient Instructions (Signed)
Visit Information  Thank you for taking time to visit with me today. Please don't hesitate to contact me if I can be of assistance to you before our next scheduled telephone appointment.  Following are the goals we discussed today:  Current Barriers:  Knowledge Deficits related to plan of care for management of CHF   RNCM Clinical Goal(s):  Patient will verbalize understanding of plan for management of CHF as evidenced by continuation of monitoring weight and adhering to low sodium diet through collaboration with RN Care manager, provider, and care team.   Interventions: Inter-disciplinary care team collaboration (see longitudinal plan of care) Evaluation of current treatment plan related to  self management and patient's adherence to plan as established by provider RN provided education on [place].S.T acronym for stroke RN provided education on Heart Attack RN provided patient with a BP monitor  Patient Goals/Self-Care Activities: Take medications as prescribed   Attend all scheduled provider appointments Call pharmacy for medication refills 3-7 days in advance of running out of medications Attend church or other social activities Perform all self care activities independently  Perform IADL's (shopping, preparing meals, housekeeping, managing finances) independently Call provider office for new concerns or questions  call office if I gain more than 2 pounds in one day or 5 pounds in one week use salt in moderation follow rescue plan if symptoms flare-up know when to call the doctorfor CHF exacerbation track symptoms and what helps feel better or worse dress right for the weather, hot or cold    Our next appointment is by telephone on March 2023  Please call the care guide team at [phone] if you need to cancel or reschedule your appointment.   Please call the Suicide and Crisis Lifeline: 988 if you are experiencing a Mental Health or [place] or need someone to  talk to.  The patient verbalized understanding of instructions, educational materials, and care plan provided today and agreed to receive a mailed copy of patient instructions, educational materials, and care plan.   Telephone follow up appointment with care management team member scheduled for: The patient has been provided with contact information for the care management team and has been advised to call with any health related questions or concerns.  F.A.S.T. acronym sheet for stroke Heart Attack educational material provided Blood Pressure monitor provided   [facility] [phone]

## 2021-04-09 NOTE — Patient Outreach (Signed)
[facility]) Care Management [place] Note   04/09/2021 Name:  [name] MRN:  [id number] DOB:  [birth year]  Summary: Patient has been monitoring his weight frequently. He takes his medications as per ordered. Patient appetite is good. He is monitoring his sodium. He does not exercise on a regular basis. Per patient he gets a lot of exercise with yard work. He has received his flu shot and pneumonia shot for this year. He has not been monitoring his blood pressure. RN provided a blood pressure monitor. He is monitoring for symptoms of CHF exacerbation.   Recommendations/Changes made from today's visit: Patient will monitor his blood pressure at home Patient will take medication as per ordered RN provided a blood pressure monitor  Patient will understand symptoms of stroke Patient will understand symptoms of heart attack   Subjective: [name] is an 80 y.o. year old male who is a primary patient of Stacks, Cletus Gash, MD. The care management team was consulted for assistance with care management and/or care coordination needs.    RN Health Coach completed Telephone Visit today.   Objective:  Medications Reviewed Today     Reviewed by Claretta Fraise, MD (Physician) on 02/23/21 at 1450  Med List Status: <None>   Medication Order Taking? Sig Documenting Provider Last Dose Status Informant  aspirin 81 MG EC tablet [id number]  Take 1 tablet (81 mg total) by mouth every other day. Claretta Fraise, MD  Active   clopidogrel (PLAVIX) 75 MG tablet [id number] Yes Take 1 tablet by mouth once daily Arnoldo Lenis, MD Taking Active   ezetimibe (ZETIA) 10 MG tablet [id number] Yes Take 1 tablet by mouth once daily Arnoldo Lenis, MD Taking Active   furosemide (LASIX) 40 MG tablet [id number] Yes Take 40 mg by mouth as needed for fluid or edema. [provider] Taking Active   isosorbide mononitrate (IMDUR) 30 MG 24 hr tablet [id number] Yes Take 1 tablet by mouth  once daily Branch, Alphonse Guild, MD Taking Active   nitroGLYCERIN (NITROSTAT) 0.4 MG SL tablet [id number] No Place 1 tablet (0.4 mg total) under the tongue every 5 (five) minutes as needed for chest pain.  Patient not taking: Reported on 02/23/2021   Arnoldo Lenis, MD Not Taking Active   rosuvastatin (CRESTOR) 40 MG tablet [id number] Yes TAKE 1 TABLET BY MOUTH ONCE DAILY AT 6 PM IN THE [place] Newcomer, Alphonse Guild, MD Taking Active   Vitamin D, Ergocalciferol, (DRISDOL) 1.25 MG (50000 UNIT) CAPS capsule [id number] Yes Take 1 capsule (50,000 Units total) by mouth once a week. Claretta Fraise, MD Taking Active              SDOH:  (Social Determinants of Health) assessments and interventions performed:  SDOH Interventions    Flowsheet Row Most Recent Value  SDOH Interventions   Food Insecurity Interventions Intervention Not Indicated  Housing Interventions Intervention Not Indicated  Transportation Interventions Intervention Not Indicated       Care Plan  Review of patient past medical history, allergies, medications, health status, including review of consultants reports, laboratory and other test data, was performed as part of comprehensive evaluation for care management services.   Care Plan : Heart Failure (Adult)  Updates made by Verlin Grills, RN since 04/09/2021 12:00 AM     Problem: Symptom Exacerbation (Heart Failure) Resolved 04/09/2021  Priority: High  Onset Date: 02/14/2020  Note:   Resolving due to duplicate goal     Long-Range Goal:  Symptom Exacerbation Prevented or Minimized Completed 04/09/2021  Start Date: 02/14/2020  Expected End Date: 05/07/2021  Recent Progress: On track  Priority: Medium  Note:   Evidence-based guidance:  Perform or review cognitive and/or health literacy screening.  Assess understanding of adherence and barriers to treatment plan, as well as lifestyle changes; develop strategies to address barriers.  Establish a mutually-agreed-upon  early intervention process to communicate with primary care provider when signs/symptoms worsen.  Facilitate timely posthospital discharge or emergency department treatment that includes intensive follow-up via telephone calls, home visit, telehealth monitoring and care at multidisciplinary heart failure clinic.  Adjust frequency and intensity of follow-up based on presentation, number of emergency department visits, hospital admissions and frequency and severity of symptom exacerbation.  Facilitate timely visit, usually within 1 week, with primary care provider following hospital discharge.  Collaborate with clinical pharmacist to address adverse drug reactions, drug interactions, subtherapeutic dosage, patient and family education.  Regularly screen for presence of depressive symptoms using a validated tool; consider pharmacologic therapy and/or referral for cognitive behavioral therapy when present.  Refer to community-based services, such as a heart failure support group, community Economist or peer support program.  Review immunization status; arrange receipt of needed vaccinations.  Prepare patient for home oxygen use based on signs/symptoms.   Notes:  [id number] Patient is monitoring symptoms    Task: Identify and Minimize Risk of Heart Failure Exacerbation Completed 04/09/2021  Due Date: 01/05/2021  Note:   Care Management Activities:    - health literacy screening completed or reviewed - healthy lifestyle promoted - medication-adherence assessment completed - rescue (action) plan developed - rescue (action) plan reviewed - self-awareness of signs/symptoms of worsening disease encouraged    Notes:     Problem: Disease Progression (Heart Failure) Resolved 04/09/2021  Priority: Medium  Onset Date: 01/05/2021  Note:   [id number] Resolving due to duplicate goal     Long-Range Goal: Comorbidities Identified and Managed Completed 04/09/2021  Start Date: 02/14/2020  Expected End  Date: 05/07/2021  Recent Progress: On track  Priority: Medium  Note:   Evidence-based guidance:  Assess and address signs/symptoms of comorbidity, including dyslipidemia, diabetes, iron deficiency, gout, arthritis, dysrhythmia, hypertension, cachexia, coronary artery disease, kidney dysfunction and lung disease.  Prepare patient for laboratory and diagnostic exams based on risk and presentation.  Prepare for use of pharmacologic therapy that may include statin, angiotensin converting enzyme (ACE) inhibitor, angiotensin receptor blocker (ARB), beta-blocker, digoxin, antidysrhythmic, diuretic or omega-3 fatty acid.  Monitor side effects and anticipate need for periodic adjustments.  Prepare patient for potential invasive treatment, such as implantable cardioverter-defibrillator, cardiac resynchronization therapy or heart transplant as disease progresses.   Notes:  [id number] Resolving due to duplicate goal     Task: Identify and Manage Comorbidities Completed 04/09/2021  Due Date: 01/05/2021  Note:   Care Management Activities:    - signs/symptoms of comorbidities identified    Notes:     Long-Range Goal: Health Optimized Completed 04/09/2021  Start Date: 02/14/2020  Expected End Date: 01/05/2021  Recent Progress: On track  Priority: Medium  Note:   Evidence-based guidance:  Use brief intervention, such as 5 A's (Ask, Advise, Assess, Assist, Arrange) to encourage smoking cessation; refer to smoking cessation program, if ready for more intensive intervention.  Perform or refer to a registered dietitian for a nutrition assessment and nutrition-focused physical exam.   Identify potential micronutrient deficiencies, such as iron, vitamin D and thiamin.  Assess need for potential diet and fluid modification, such as  reduced sodium or fluid intake.  Minimize unnecessary dietary restrictions to increase oral intake. Note: Sodium restriction should be individualized to the patient and clinical  status.  Facilitate home monitoring of weight.   Notes:  [id number] Resolving due to duplicate goal     Task: Optimize Health Completed 04/09/2021  Due Date: 01/05/2021  Note:   Care Management Activities:    - home monitoring of weight gain or loss encouraged - strategies to maintain appetite promoted - weight gain or loss reviewed and trended    Notes:     Problem: Activity Tolerance (Heart Failure) Resolved 04/09/2021  Priority: Medium  Onset Date: 01/05/2021  Note:   [id number] Resolving due to duplicate goal     Long-Range Goal: Activity Tolerance Optimized Completed 04/09/2021  Start Date: 02/14/2020  Expected End Date: 05/07/2021  Recent Progress: On track  Priority: Medium  Note:   Evidence-based guidance:  Promote daily physical activity that improves functional ability, cognition and quality of life.  Encourage reduction in sedentary time.   Encourage optimal, safe functional mobility and self-care performance based on ability and tolerance.   Promote breathing and energy conservation techniques, such as pursed-lip breathing, preplanning and pacing of activity, balancing activity and rest.  Encourage participation in cardiac rehabilitation services.   Notes:  [id number] Patient is active mowing 2 yards    Task: Maintain Strength and Functional Ability Completed 04/09/2021  Due Date: 01/05/2021  Note:   Care Management Activities:    - activity or exercise based on tolerance encouraged - barriers to activities addressed - daily activity/exercise promoted - energy conservation techniques promoted - self-care encouraged    Notes:     Care Plan : Wellness (Adult)  Updates made by Fontella Shan, Eppie Gibson, RN since 04/09/2021 12:00 AM     Problem: Health Literacy (Wellness) Resolved 04/09/2021  Note:   [id number] Resolving due to duplicate goal     Goal: Health Literacy Improved Completed 04/09/2021  Start Date: 02/14/2020  Expected End Date: 01/05/2021  Recent  Progress: On track  Priority: Medium  Note:   Evidence-based guidance:  Assess health literacy using [employer] of [employer] recommended questions or standardized tools.  Use a universal method with health literacy to esure a nonjudgmental approach to varying levels of literacy.  Identify barriers to seeking or understanding information.  Note: Patient barriers may include literacy, language or culture, mental health, stigma, embarrassment; clinician barriers may include avoidance, time constraint, stereotype or bias.  Collaborate with interprofessional team and child and parent/caregiver to develop a structured yet individualized education plan that supports shared decision-making.  Consider a combination of strategies such as print, audiotape, video and computer-based learning in a group or individual setting.  Encourage patient to seek health information and education in the community such as Art therapist, continuing education, English as a second language class, support group or peer Engineer, maintenance.  Provide educational materials that are written in plain language (3rd to 5th grade reading level) and include icons, pictures and tables; provide essential information first.   Notes:     Task: Identify Deficit and [place] Completed 04/09/2021  Due Date: 01/05/2021  Note:   Care Management Activities:    - education plan reviewed and/or amended - empathy and reassurance conveyed - health literacy screen reviewed - patient's preferred learning methods utilized - privacy ensured - questions encouraged - readiness to learn monitored    Notes:     Care Plan : [place] of Care  Updates made  by Verlin Grills, RN since 04/09/2021 12:00 AM     Problem: Knowledge Deficit Related to Congestive Heart Failure and Care Coordination Needs   Priority: High     Long-Range Goal: Development Plan of Care for Management of Congestive Heart Failure   Start Date: 04/09/2021   Expected End Date: 05/07/2022  Priority: High  Note:   Current Barriers:  Knowledge Deficits related to plan of care for management of CHF   RNCM Clinical Goal(s):  Patient will verbalize understanding of plan for management of CHF as evidenced by continuation of monitoring weight and adhering to low sodium diet  through collaboration with RN Care manager, provider, and care team.   Interventions: Inter-disciplinary care team collaboration (see longitudinal plan of care) Evaluation of current treatment plan related to  self management and patient's adherence to plan as established by provider RN provided education on [place].S.T acronym for stroke RN provided education on Heart Attack RN provided patient with a BP monitor  Patient Goals/Self-Care Activities: Take medications as prescribed   Attend all scheduled provider appointments Call pharmacy for medication refills 3-7 days in advance of running out of medications Attend church or other social activities Perform all self care activities independently  Perform IADL's (shopping, preparing meals, housekeeping, managing finances) independently Call provider office for new concerns or questions  call office if I gain more than 2 pounds in one day or 5 pounds in one week use salt in moderation follow rescue plan if symptoms flare-up know when to call the doctorfor CHF exacerbation track symptoms and what helps feel better or worse dress right for the weather, hot or cold        Plan: Telephone follow up appointment with care management team member scheduled for:  July 07, 2021 The patient has been provided with contact information for the care management team and has been advised to call with any health related questions or concerns.  RN provided blood pressure monitor RN provided information on How to monitor blood pressure RN sent F.A.S.T. acronym stroke sheet RN sent educational information on Heart attack   [place] [facility] [phone]

## 2021-05-07 ENCOUNTER — Other Ambulatory Visit: Payer: Self-pay | Admitting: Cardiology

## 2021-06-16 ENCOUNTER — Other Ambulatory Visit: Payer: Self-pay | Admitting: Cardiology

## 2021-06-25 ENCOUNTER — Other Ambulatory Visit: Payer: Self-pay

## 2021-06-25 ENCOUNTER — Encounter: Payer: Self-pay | Admitting: Cardiology

## 2021-06-25 ENCOUNTER — Ambulatory Visit: Payer: Medicare HMO | Admitting: Cardiology

## 2021-06-25 VITALS — BP 122/84 | HR 64 | Ht 74.5 in | Wt 211.4 lb

## 2021-06-25 DIAGNOSIS — E782 Mixed hyperlipidemia: Secondary | ICD-10-CM | POA: Diagnosis not present

## 2021-06-25 DIAGNOSIS — I5032 Chronic diastolic (congestive) heart failure: Secondary | ICD-10-CM

## 2021-06-25 DIAGNOSIS — I10 Essential (primary) hypertension: Secondary | ICD-10-CM

## 2021-06-25 DIAGNOSIS — I2510 Atherosclerotic heart disease of native coronary artery without angina pectoris: Secondary | ICD-10-CM

## 2021-06-25 NOTE — Progress Notes (Signed)
Clinical Summary Mr. Tissue is a 81 y.o.maleseen today for follow up of the following medical problems.    1. CAD - multiple PCIs in the past - hx of MI in 2004, with stenting to RCA (ostium down to PDA). Of note had GI bleed 2 months later on DAPT and NSAIDs requiring transfusion.   - cath from 2007 with occluded LCX OM, received a BMS. RCA stents were patent at that time. LVEF 70% by LV gram at that time, no echoes in our system     - admit 07/2018 with acute posterior STEMI, taken to cath lab - 07/2018 cath showed ostal LAD 60%, mid LAD 99%, D1 95%, OM1 occluded, RCA 30%, mid RCA 95%. Received DES to OM1, then staged PCI to RCA. From notes no options on treating LAD, diffusely diseased and no target for CABG. Recs for indefinitie DAPT. LVEDP 29, limited diuresis due to uptrend in Cr.   -  changed from brillinta to plavix due to SOB   - no chest pains. No SOB/DOE - compliant with meds     2. Chronic diastolic HF - has not required prn lasix.  - no recent edema.    3. HTN - compliant with meds   4. Hyperlipidemia   - 02/2020 TC 142 TG 87 HDL 51 LDL 74 - 08/2020 TC 138 TG 114 HL 45 LDL 72 - he is on crestor 40, zetia 10  02/2021 TC 160 TG 107 HDL 52 LDL 89   5. Chronic bradycardia - no recent symptoms       Past Medical History:  Diagnosis Date   Acute combined systolic and diastolic heart failure ([place]) 07/19/2018   Bradycardia    CAD in native artery    Foot drop, right foot    History of GI bleed    History of mitral valve prolapse    History of peptic ulcer disease    Hyperlipidemia, mixed    Hypertension    MI (myocardial infarction) ([place]) 2000, 2004   Neuromuscular disorder ([place])    PUD, HX OF 01/03/2009   Qualifier: Diagnosis of  By: Orville Govern CMA, Carol     STEMI involving left circumflex coronary artery ([place]) 07/18/2018     Allergies  Allergen Reactions   Brilinta [Ticagrelor] Shortness Of Breath   Ibuprofen Nausea Only     Current Outpatient  Medications  Medication Sig Dispense Refill   clopidogrel (PLAVIX) 75 MG tablet Take 1 tablet by mouth once daily 90 tablet 1   aspirin 81 MG EC tablet Take 1 tablet (81 mg total) by mouth every other day. 30 tablet    ezetimibe (ZETIA) 10 MG tablet Take 1 tablet by mouth once daily 90 tablet 1   furosemide (LASIX) 40 MG tablet Take 40 mg by mouth as needed for fluid or edema.     isosorbide mononitrate (IMDUR) 30 MG 24 hr tablet Take 1 tablet by mouth once daily 90 tablet 0   nitroGLYCERIN (NITROSTAT) 0.4 MG SL tablet Place 1 tablet (0.4 mg total) under the tongue every 5 (five) minutes as needed for chest pain. (Patient not taking: Reported on 02/23/2021) 25 tablet 3   rosuvastatin (CRESTOR) 40 MG tablet TAKE 1 TABLET BY MOUTH ONCE DAILY AT 6 PM IN THE EVENING 90 tablet 1   Vitamin D, Ergocalciferol, (DRISDOL) 1.25 MG (50000 UNIT) CAPS capsule Take 1 capsule (50,000 Units total) by mouth once a week. 13 capsule 0   No current facility-administered medications  for this visit.     Past Surgical History:  Procedure Laterality Date   CORONARY STENT INTERVENTION N/A 07/18/2018   Procedure: CORONARY STENT INTERVENTION;  Surgeon: Martinique, Peter M, MD;  Location: [facility];  Service: Cardiovascular;  Laterality: N/A;   CORONARY STENT INTERVENTION N/A 07/20/2018   Procedure: CORONARY STENT INTERVENTION;  Surgeon: Martinique, Peter M, MD;  Location: [facility];  Service: Cardiovascular;  Laterality: N/A;   CORONARY STENT PLACEMENT  2004, 2007   5 in 2004, 1 in 2007   CORONARY/GRAFT ACUTE MI REVASCULARIZATION N/A 07/18/2018   Procedure: Coronary/Graft Acute MI Revascularization;  Surgeon: Martinique, Peter M, MD;  Location: [facility];  Service: Cardiovascular;  Laterality: N/A;   LEFT HEART CATH AND CORONARY ANGIOGRAPHY N/A 07/18/2018   Procedure: LEFT HEART CATH AND CORONARY ANGIOGRAPHY;  Surgeon: Martinique, Peter M, MD;  Location: [facility];  Service: Cardiovascular;   Laterality: N/A;     Allergies  Allergen Reactions   Brilinta [Ticagrelor] Shortness Of Breath   Ibuprofen Nausea Only      Family History  Problem Relation Age of Onset   Heart attack Mother    Heart attack Father    Coronary artery disease Other    Cancer Daughter      Social History Mr. Hove reports that he quit smoking about 45 years ago. His smoking use included cigarettes. He has a 60.00 pack-year smoking history. He has never used smokeless tobacco. Mr. Haughn reports no history of alcohol use.   Review of Systems CONSTITUTIONAL: No weight loss, fever, chills, weakness or fatigue.  HEENT: +nose bleeds SKIN: No rash or itching.  CARDIOVASCULAR: per hperi RESPIRATORY: No shortness of breath, cough or sputum.  GASTROINTESTINAL: No anorexia, nausea, vomiting or diarrhea. No abdominal pain or blood.  GENITOURINARY: No burning on urination, no polyuria NEUROLOGICAL: No headache, dizziness, syncope, paralysis, ataxia, numbness or tingling in the extremities. No change in bowel or bladder control.  MUSCULOSKELETAL: No muscle, back pain, joint pain or stiffness.  LYMPHATICS: No enlarged nodes. No history of splenectomy.  PSYCHIATRIC: No history of depression or anxiety.  ENDOCRINOLOGIC: No reports of sweating, cold or heat intolerance. No polyuria or polydipsia.  [name]   Physical Examination Today's Vitals   06/25/21 0806  BP: 122/84  Pulse: 64  SpO2: 98%  Weight: 211 lb 6.4 oz (95.9 kg)  Height: 6' 2.5" (1.892 m)   Body mass index is 26.78 kg/m.  Gen: resting comfortably, no acute distress HEENT: no scleral icterus, pupils equal round and reactive, no palptable cervical adenopathy,  CV: RRR, no m/r/g no jvd Resp: Clear to auscultation bilaterally GI: abdomen is soft, non-tender, non-distended, normal bowel sounds, no hepatosplenomegaly MSK: extremities are warm, no edema.  Skin: warm, no rash Neuro:  no focal deficits Psych: appropriate affect   Diagnostic  Studies  Cath 2007 Central aortic pressure is 101/68 with a mean of 84. LV pressure is 94/1   with an EDP of 6. There is no aortic stenosis.   Left main was normal.   LAD was a large vessel coursing to the apex. There was very prominent   severe ectasia in the proximal portion with aneurysmal dilatation and   swirling flow. However, no high grade stenosis. In the mid LAD, there was   a diffuse 40-50% segmental plaquing. In the distal LAD, there was a tubular   70-80% stenosis. There also were two diagonals. The first diagonal is a   medium vessel  with a long 90% stenosis. The second diagonal was small with   branching vessel with a 70-80% proximal lesion.   The left circumflex was a moderate sized system. It gave off a large   branching OM-1 and a small OM-2. There is a 40% proximal lesion in the   circumflex. The lower Dejanique Ruehl of the OM was totally occluded and the distal   part of the vessel filled through collaterals with TIMI I flow. There is a   40% blockage in the upper portion of the OM.   The right coronary artery was a large dominant vessel. It gave off a   moderate sized PDA and two small PLs. There was evidence of previously   placed stent from the ostial portion down to the PDA. There was 40% ostial   stenosis with damping of the 6-French catheter. This did not change with   sublingual nitroglycerin. There is a 30% lesion in the proximal to mid   portion.   Left ventriculogram done the RAO position showed an EF of 70% with mild   mitral valve prolapse. There was no wall motion abnormalities and there was   no mitral regurgitation. Left ventriculogram done in the LAO position   showed an EF of 70% with no regional wall motion abnormalities.   Abdominal aortogram showed patent renal arteries bilaterally with no   abdominal aortic aneurysm.   A left subclavian done for possible bypass surgery showed a patent LIMA to   the chest wall with no obstructions in the subclavian.    ASSESSMENT:   1. Three vessel native coronary artery disease.   2. The LAD has diffuse ectasia and swelling flow proximally as described   above. There is a borderline lesion in the distal LAD and a high grade   disease in the diagonal, but these do not appear to be the culprit   lesion.   3. Totally occluded OM1 which appears to be the culprit.   4. Patent right coronary stents.   5. Normal LV function with mild mitral valve prolapse but no significant   mitral regurgitation.   CONCLUSION: Successful PCI of a totally occluded circumflex marginal vessel   with improvement in narrowing from 100% to 0% and improvement in flow from   TIMI 0 to TIMI III flow using a bare metal stent.     07/2018 echo IMPRESSIONS      1. The left ventricle has low normal systolic function, with an ejection fraction of 50-55%. The cavity size was normal. Left ventricular diastolic Doppler parameters are indeterminate.  2. Mild hypokinesis of the left ventricular, mid-apical inferoseptal wall, inferior segment and inferolateral wall.  3. The right ventricle has normal systolic function. The cavity was normal. There is no increase in right ventricular wall thickness.  4. The aortic valve is tricuspid Mild thickening of the aortic valve Mild calcification of the aortic valve. Aortic valve regurgitation was not assessed by color flow Doppler.     07/2018 cath Previously placed Mid RCA to Dist RCA stent (unknown type) is widely patent. Ost RCA to Mid RCA lesion is 30% stenosed. Mid RCA lesion is 95% stenosed. Ost 1st Mrg to 1st Mrg lesion is 100% stenosed. Ost LAD to Prox LAD lesion is 60% stenosed. Prox LAD lesion is 25% stenosed. Ost 1st Diag to 1st Diag lesion is 95% stenosed. Mid LAD to Dist LAD lesion is 99% stenosed. Post intervention, there is a 0% residual stenosis. A drug-eluting stent was successfully  placed using a STENT SYNERGY DES 3X24. LV end diastolic pressure is moderately elevated.   1.  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 Diffuse 99% stenosis in the mid to distal LAD. 95% bifurcating first diagonal.    - 100% occlusion of the large first OM at site of prior stent. This is the culprit lesion   - 95% mid RCA in stent 2. Moderately elevated LVEDP 3. Successful PCI of the OM 1 with DES x1   Discussed with Dr. Gwenlyn Found. There are no options for treating the LAD. It is diffusely diseased and there is no target for CABG. There are some right to left collaterals to the distal LAD. We proceeded with emergent PCI of the OM1. Recommend DAPT indefinitely given multiple layers of stent. Plan to bring patient back in 2 days for PCI of the RCA. Will treat the LAD medically. Check LV function by Echo. Maximize medical therapy. Stress importance of medication compliance.      07/2018 staged PCI Mid RCA lesion is 95% stenosed. A drug-eluting stent was successfully placed using a STENT SYNERGY DES 3.5X16. Post intervention, there is a 0% residual stenosis.   1. Successful PCI of the mid RCA with DES x 1.   Plan: continue DAPT indefinitely. Anticipate DC home in am.     Assessment and Plan  1. CAD - he is committed to indefinite DAPT as reported above - no symptoms, cotinue current meds. Off beta blocker due to bradycardia.    2. Chronic diastolic HF - doing well, continue prn lasix   3. HTN - bp at goal, will continue current meds   4. Hyperlipidemia -above goal, he is on max oral therapy with crestor 66m and zetia 10 - we discussed pcsk9i, at this time he wants to think about.    F/u 6 months      Arnoldo Lenis, M.D.

## 2021-06-25 NOTE — Patient Instructions (Signed)

## 2021-07-02 DIAGNOSIS — R600 Localized edema: Secondary | ICD-10-CM | POA: Diagnosis not present

## 2021-07-02 DIAGNOSIS — H269 Unspecified cataract: Secondary | ICD-10-CM | POA: Diagnosis not present

## 2021-07-02 DIAGNOSIS — K219 Gastro-esophageal reflux disease without esophagitis: Secondary | ICD-10-CM | POA: Diagnosis not present

## 2021-07-02 DIAGNOSIS — E785 Hyperlipidemia, unspecified: Secondary | ICD-10-CM | POA: Diagnosis not present

## 2021-07-02 DIAGNOSIS — G629 Polyneuropathy, unspecified: Secondary | ICD-10-CM | POA: Diagnosis not present

## 2021-07-02 DIAGNOSIS — N529 Male erectile dysfunction, unspecified: Secondary | ICD-10-CM | POA: Diagnosis not present

## 2021-07-02 DIAGNOSIS — Z008 Encounter for other general examination: Secondary | ICD-10-CM | POA: Diagnosis not present

## 2021-07-02 DIAGNOSIS — R0602 Shortness of breath: Secondary | ICD-10-CM | POA: Diagnosis not present

## 2021-07-02 DIAGNOSIS — I25119 Atherosclerotic heart disease of native coronary artery with unspecified angina pectoris: Secondary | ICD-10-CM | POA: Diagnosis not present

## 2021-07-02 DIAGNOSIS — I739 Peripheral vascular disease, unspecified: Secondary | ICD-10-CM | POA: Diagnosis not present

## 2021-07-02 DIAGNOSIS — I252 Old myocardial infarction: Secondary | ICD-10-CM | POA: Diagnosis not present

## 2021-07-02 DIAGNOSIS — M1990 Unspecified osteoarthritis, unspecified site: Secondary | ICD-10-CM | POA: Diagnosis not present

## 2021-07-02 DIAGNOSIS — I10 Essential (primary) hypertension: Secondary | ICD-10-CM | POA: Diagnosis not present

## 2021-07-07 ENCOUNTER — Other Ambulatory Visit: Payer: Self-pay | Admitting: *Deleted

## 2021-07-07 NOTE — Patient Outreach (Signed)
[facility]) Care Management ? ?07/07/2021 ? ?Cannon Kettle ?[birth year] ?[id number] ? ? ?RN Health Coach attempted follow up outreach call to patient. Phone was busy. ? ?Plan: ?RN will call patient again within 30 days. ? ?[name] BSN RN ?[place] Management ?[phone] ? ?

## 2021-07-08 ENCOUNTER — Encounter: Payer: Self-pay | Admitting: *Deleted

## 2021-07-13 ENCOUNTER — Other Ambulatory Visit: Payer: Self-pay | Admitting: *Deleted

## 2021-07-13 NOTE — Patient Outreach (Signed)
[facility]) Care Management ? ?07/13/2021 ? ?Cannon Kettle ?05/27/1940 ?[id number] ? ? ?RN Health Coach attempted follow up outreach call to patient.  Patient was unavailable. HIPPA compliance voicemail message left with return callback number. ? ?Plan: ?RN will call patient again within 30 days. ? ?[name] BSN RN ?[place] Management ?[phone] ? ?

## 2021-07-20 ENCOUNTER — Encounter: Payer: Self-pay | Admitting: Family Medicine

## 2021-08-10 ENCOUNTER — Other Ambulatory Visit: Payer: Self-pay | Admitting: *Deleted

## 2021-08-10 NOTE — Patient Outreach (Signed)
[facility]) Care Management ? ?08/10/2021 ? ?[name] ?March 27, 1941 ?[id number] ? ? ?RN Health Coach attempted follow up outreach call to patient.  Patient was unavailable. HIPPA compliance voicemail message left with return callback number. ? ?Plan: ?RN will call patient again within 30 days. ? ?[name] BSN RN ?[facility] Management ?[phone] ? ?

## 2021-08-24 ENCOUNTER — Ambulatory Visit (INDEPENDENT_AMBULATORY_CARE_PROVIDER_SITE_OTHER): Payer: Medicare HMO | Admitting: Family Medicine

## 2021-08-24 ENCOUNTER — Encounter: Payer: Self-pay | Admitting: Family Medicine

## 2021-08-24 VITALS — BP 120/70 | HR 60 | Temp 97.7°F | Ht 74.5 in | Wt 209.2 lb

## 2021-08-24 DIAGNOSIS — H538 Other visual disturbances: Secondary | ICD-10-CM

## 2021-08-24 DIAGNOSIS — E785 Hyperlipidemia, unspecified: Secondary | ICD-10-CM | POA: Diagnosis not present

## 2021-08-24 DIAGNOSIS — R002 Palpitations: Secondary | ICD-10-CM

## 2021-08-24 DIAGNOSIS — I2510 Atherosclerotic heart disease of native coronary artery without angina pectoris: Secondary | ICD-10-CM

## 2021-08-24 DIAGNOSIS — E559 Vitamin D deficiency, unspecified: Secondary | ICD-10-CM

## 2021-08-24 DIAGNOSIS — I10 Essential (primary) hypertension: Secondary | ICD-10-CM

## 2021-08-24 DIAGNOSIS — Z0001 Encounter for general adult medical examination with abnormal findings: Secondary | ICD-10-CM

## 2021-08-24 NOTE — Progress Notes (Signed)
? ?Subjective:  ?Patient ID: [name], male    DOB: Nov 13, 1940  Age: 81 y.o. MRN: [id number] ? ?CC: Annual Exam ? ? ?HPI ?[name] presents for Complete physical. Doing well. No complaints. Followed for ASCVD and chronic CHF. Asymptomatic currently.  ? ? presents for  follow-up of hypertension. Patient has no history of headache chest pain or shortness of breath or recent cough. Patient also denies symptoms of TIA such as focal numbness or weakness. Patient denies side effects from medication. States taking it regularly. ? ? ? ?  08/24/2021  ?  9:17 AM 02/23/2021  ?  9:08 AM 08/25/2020  ?  1:29 PM  ?Depression screen PHQ 2/9  ?Decreased Interest 0 0 0  ?Down, Depressed, Hopeless 0 0 0  ?PHQ - 2 Score 0 0 0  ? ? ?History ?[name] has a past medical history of Acute combined systolic and diastolic heart failure ([place]) (07/19/2018), Bradycardia, CAD in native artery, Foot drop, right foot, History of GI bleed, History of mitral valve prolapse, History of peptic ulcer disease, Hyperlipidemia, mixed, Hypertension, MI (myocardial infarction) ([place]) (2000, 2004), Neuromuscular disorder ([place]), PUD, HX OF (01/03/2009), and STEMI involving left circumflex coronary artery ([place]) (07/18/2018).  ? ?He has a past surgical history that includes Coronary stent placement (2004, 2007); LEFT HEART CATH AND CORONARY ANGIOGRAPHY (N/A, 07/18/2018); Coronary/Graft Acute MI Revascularization (N/A, 07/18/2018); CORONARY STENT INTERVENTION (N/A, 07/18/2018); and CORONARY STENT INTERVENTION (N/A, 07/20/2018).  ? ?His family history includes Cancer in his daughter; Coronary artery disease in an other family member; Heart attack in his father and mother.He reports that he quit smoking about 45 years ago. His smoking use included cigarettes. He has a 60.00 pack-year smoking history. He has never used smokeless tobacco. He reports that he does not drink alcohol and does not use drugs. ? ? ? ?ROS ?Review of Systems  ?Constitutional:  Negative for  activity change, fatigue and unexpected weight change.  ?HENT:  Negative for congestion, ear pain, hearing loss, postnasal drip and trouble swallowing.   ?Eyes:  Negative for pain and visual disturbance.  ?Respiratory:  Negative for cough, chest tightness and shortness of breath.   ?Cardiovascular:  Positive for palpitations (rare flutter). Negative for chest pain and leg swelling.  ?Gastrointestinal:  Negative for abdominal distention, abdominal pain, blood in stool, constipation, diarrhea, nausea and vomiting.  ?Endocrine: Negative for cold intolerance, heat intolerance and polydipsia.  ?Genitourinary:  Negative for difficulty urinating, dysuria, flank pain, frequency (occasional nocturia once or twice) and urgency.  ?Musculoskeletal:  Negative for arthralgias and joint swelling.  ?Skin:  Negative for color change, rash and wound.  ?Neurological:  Negative for dizziness, syncope, speech difficulty, weakness, light-headedness, numbness and headaches.  ?Hematological:  Does not bruise/bleed easily.  ?Psychiatric/Behavioral:  Negative for confusion, decreased concentration, dysphoric mood and sleep disturbance. The patient is not nervous/anxious.   ? ?Objective:  ?BP 120/70   Pulse 60   Temp 97.7 ?F (36.5 ?C)   Ht 6' 2.5" (1.892 m)   Wt 209 lb 3.2 oz (94.9 kg)   SpO2 95%   BMI 26.50 kg/m?  ? ?BP Readings from Last 3 Encounters:  ?08/24/21 120/70  ?06/25/21 122/84  ?02/23/21 (!) 142/78  ? ? ?Wt Readings from Last 3 Encounters:  ?08/24/21 209 lb 3.2 oz (94.9 kg)  ?06/25/21 211 lb 6.4 oz (95.9 kg)  ?02/23/21 208 lb (94.3 kg)  ? ? ? ?Physical Exam ?Constitutional:   ?   Appearance: He is well-developed.  ?HENT:  ?  Head: Normocephalic and atraumatic.  ?Eyes:  ?   Pupils: Pupils are equal, round, and reactive to light.  ?Neck:  ?   Thyroid: No thyromegaly.  ?   Trachea: No tracheal deviation.  ?Cardiovascular:  ?   Rate and Rhythm: Normal rate and regular rhythm.  ?   Heart sounds: Normal heart sounds. No murmur  heard. ?  No friction rub. No gallop.  ?Pulmonary:  ?   Breath sounds: Normal breath sounds. No wheezing or rales.  ?Abdominal:  ?   General: Bowel sounds are normal. There is no distension.  ?   Palpations: Abdomen is soft. There is no mass.  ?   Tenderness: There is no abdominal tenderness.  ?   Hernia: There is no hernia in the left inguinal area or right inguinal area.  ?Genitourinary: ?   Penis: Normal and uncircumcised.   ?   Testes: Normal.  ?   Epididymis:  ?   Right: Normal.  ?   Left: Normal.  ?   Tanner stage (genital): 5.  ?Musculoskeletal:     ?   General: Normal range of motion.  ?   Cervical back: Normal range of motion.  ?Lymphadenopathy:  ?   Cervical: No cervical adenopathy.  ?Skin: ?   General: Skin is warm and dry.  ?Neurological:  ?   Mental Status: He is alert and oriented to person, place, and time.  ? ? ? ? ?Assessment & Plan:  ? ?Morton was seen today for annual exam. ? ?Diagnoses and all orders for this visit: ? ?Essential hypertension, benign ?-     CBC with Differential/Platelet ?-     CMP14+EGFR ?-     Urinalysis ? ?Hyperlipidemia with target LDL less than 70 ?-     Lipid panel ? ?Vitamin D deficiency ?-     VITAMIN D 25 Hydroxy (Vit-D Deficiency, Fractures) ? ?Blurred vision ?-     Ambulatory referral to Ophthalmology ? ?ASCVD (arteriosclerotic cardiovascular disease) ? ?Palpitations ?-     EKG 12-Lead ? ? ? ? ? ? ?I am having [name] maintain his Vitamin D (Ergocalciferol), furosemide, nitroGLYCERIN, rosuvastatin, ezetimibe, aspirin, clopidogrel, and isosorbide mononitrate. ? ?Allergies as of 08/24/2021   ? ?   Reactions  ? Brilinta [ticagrelor] Shortness Of Breath  ? Ibuprofen Nausea Only  ? ?  ? ?  ?Medication List  ?  ? ?  ? Accurate as of August 24, 2021  9:45 AM. If you have any questions, ask your nurse or doctor.  ?  ?  ? ?  ? ?aspirin 81 MG EC tablet ?Take 1 tablet (81 mg total) by mouth every other day. ?  ?clopidogrel 75 MG tablet ?Commonly known as: PLAVIX ?Take 1 tablet  by mouth once daily ?  ?ezetimibe 10 MG tablet ?Commonly known as: ZETIA ?Take 1 tablet by mouth once daily ?  ?furosemide 40 MG tablet ?Commonly known as: LASIX ?Take 40 mg by mouth daily as needed for fluid or edema. ?  ?isosorbide mononitrate 30 MG 24 hr tablet ?Commonly known as: IMDUR ?Take 1 tablet by mouth once daily ?  ?nitroGLYCERIN 0.4 MG SL tablet ?Commonly known as: NITROSTAT ?Place 1 tablet (0.4 mg total) under the tongue every 5 (five) minutes as needed for chest pain. ?  ?rosuvastatin 40 MG tablet ?Commonly known as: CRESTOR ?TAKE 1 TABLET BY MOUTH ONCE DAILY AT 6 PM IN THE EVENING ?  ?Vitamin D (Ergocalciferol) 1.25 MG (50000 UNIT) Caps capsule ?  Commonly known as: DRISDOL ?Take 1 capsule (50,000 Units total) by mouth once a week. ?  ? ?  ? ? ? ?Follow-up: Return in about 6 months (around 02/23/2022). ? ?Claretta Fraise, M.D. ?

## 2021-08-26 ENCOUNTER — Encounter: Payer: Medicare HMO | Admitting: Family Medicine

## 2021-09-08 ENCOUNTER — Other Ambulatory Visit: Payer: Self-pay | Admitting: *Deleted

## 2021-09-08 NOTE — Patient Instructions (Signed)
Visit Information ? ?Thank you for taking time to visit with me today. Please don't hesitate to contact me if I can be of assistance to you before our next scheduled telephone appointment. ? ?Following are the goals we discussed today:  ?Current Barriers:  ?Knowledge Deficits related to plan of care for management of CHF  ? ?RNCM Clinical Goal(s):  ?Patient will verbalize understanding of plan for management of CHF as evidenced by continuation of monitoring weight and adhering to congestive heart failure through collaboration with RN Care manager, provider, and care team.  ? ?Interventions: ?Inter-disciplinary care team collaboration (see longitudinal plan of care) ?Evaluation of current treatment plan related to  self management and patient's adherence to plan as established by provider ? ? ?Heart Failure Interventions:  (Status:  Goal on track:  Yes.) Long Term Goal ?Basic overview and discussion of pathophysiology of Heart Failure reviewed ?Provided education on low sodium diet ?Reviewed Heart Failure Action Plan in depth and provided written copy ?Discussed importance of daily weight and advised patient to weigh and record daily ?Discussed the importance of keeping all appointments with provider ? ?Patient Goals/Self-Care Activities: ?Take all medications as prescribed ?Attend all scheduled provider appointments ?Call pharmacy for medication refills 3-7 days in advance of running out of medications ?Perform all self care activities independently  ?Perform IADL's (shopping, preparing meals, housekeeping, managing finances) independently ?Call provider office for new concerns or questions  ?call the Suicide and Crisis Lifeline: 988 if experiencing a Mental Health or [place]  ?call office if I gain more than 2 pounds in one day or 5 pounds in one week ?use salt in moderation ?weigh myself daily ?track symptoms and what helps feel better or worse ?dress right for the weather, hot or cold ? ?Follow  Up Plan:  Telephone follow up appointment with care management team member scheduled for:  August   2023 ?The patient has been provided with contact information for the care management team and has been advised to call with any health related questions or concerns.   ? ?[phone] Per patient wife he is monitoring weight frequently. He has not gained any weight. He is monitoring his blood pressure at home. His appetite is good. He has not had any recent falls. He is taking his medications as per ordered.  ? ?Our next appointment is by telephone on August 23.2023 ? ?Please call [name] RN [phone] if you need to cancel or reschedule your appointment.  ? ?Please call the Suicide and Crisis Lifeline: 988 if you are experiencing a Mental Health or [place] or need someone to talk to. ? ?The patient verbalized understanding of instructions, educational materials, and care plan provided today and agreed to receive a mailed copy of patient instructions, educational materials, and care plan.  ? ?Telephone follow up appointment with care management team member scheduled for: ?The patient has been provided with contact information for the care management team and has been advised to call with any health related questions or concerns.  ? ?SIGNATURE ? ?[name] BSN RN ?[place] Management ?[phone] ? ? ? ? ?

## 2021-09-08 NOTE — Patient Outreach (Signed)
[facility]) Care Management ?RN Health Coach Note ? ? ?09/08/2021 ?Name:  [name] MRN:  [id number] DOB:  1940/07/21 ? ?Summary: ?Patient is weighing frequently. He has not had any weight gain. He is monitoring his blood pressure. His appetite is good. Patient has not had any recent falls. Patient is adhering to diet.  ? ?Recommendations/Changes made from today's visit: ?Continue monitoring weight ?Medication adherence ? ? ?Subjective: ?[name] is an 81 y.o. year old male who is a primary patient of Stacks, Cletus Gash, MD. The care management team was consulted for assistance with care management and/or care coordination needs.   ? ?RN Health Coach completed Telephone Visit today.  ? ?Objective: ? ?Medications Reviewed Today   ? ? Reviewed by Claretta Fraise, MD (Physician) on 08/24/21 at [facility] List Status: <None>  ? ?Medication Order Taking? Sig Documenting Provider Last Dose Status Informant  ?aspirin 81 MG EC tablet [id number] Yes Take 1 tablet (81 mg total) by mouth every other day. Claretta Fraise, MD Taking Active   ?clopidogrel (PLAVIX) 75 MG tablet [id number] Yes Take 1 tablet by mouth once daily Arnoldo Lenis, MD Taking Active   ?ezetimibe (ZETIA) 10 MG tablet [id number] Yes Take 1 tablet by mouth once daily Arnoldo Lenis, MD Taking Active   ?furosemide (LASIX) 40 MG tablet [id number] Yes Take 40 mg by mouth daily as needed for fluid or edema. [provider] Taking Active   ?isosorbide mononitrate (IMDUR) 30 MG 24 hr tablet [id number] Yes Take 1 tablet by mouth once daily Branch, Alphonse Guild, MD Taking Active   ?nitroGLYCERIN (NITROSTAT) 0.4 MG SL tablet [id number] Yes Place 1 tablet (0.4 mg total) under the tongue every 5 (five) minutes as needed for chest pain. Arnoldo Lenis, MD Taking Active   ?rosuvastatin (CRESTOR) 40 MG tablet [id number] Yes TAKE 1 TABLET BY MOUTH ONCE DAILY AT 6 PM IN THE Lovey Newcomer, Alphonse Guild, MD Taking Active   ?Vitamin D,  Ergocalciferol, (DRISDOL) 1.25 MG (50000 UNIT) CAPS capsule [id number] Yes Take 1 capsule (50,000 Units total) by mouth once a week. Claretta Fraise, MD Taking Active   ? ?  ?  ? ?  ? ? ? ?SDOH:  (Social Determinants of Health) assessments and interventions performed:  ?SDOH Interventions   ? ?Flowsheet Row Most Recent Value  ?SDOH Interventions   ?Food Insecurity Interventions Intervention Not Indicated  ?Housing Interventions Intervention Not Indicated  ?Transportation Interventions Intervention Not Indicated  ? ?  ? ? ?Care Plan ? ?Review of patient past medical history, allergies, medications, health status, including review of consultants reports, laboratory and other test data, was performed as part of comprehensive evaluation for care management services.  ? ?Care Plan : RN Care Manger Plan of Care  ?Updates made by Macauley Mossberg, Eppie Gibson, RN since 09/08/2021 12:00 AM  ?  ? ?Problem: Knowledge Deficit Related to Congestive Heart Failure and Care Coordination Needs   ?Priority: High  ?  ? ?Long-Range Goal: Patient-Specific Goal   ?Start Date: 09/08/2021  ?Expected End Date: 09/09/2022  ?Priority: High  ?Note:   ?Current Barriers:  ?Knowledge Deficits related to plan of care for management of CHF  ? ?RNCM Clinical Goal(s):  ?Patient will verbalize understanding of plan for management of CHF as evidenced by continuation of monitoring weight and adhering to congestive heart failure  through collaboration with RN Care manager, provider, and care team.  ? ?Interventions: ?Inter-disciplinary care team collaboration (see longitudinal plan of care) ?Evaluation  of current treatment plan related to  self management and patient's adherence to plan as established by provider ? ? ?Heart Failure Interventions:  (Status:  Goal on track:  Yes.) Long Term Goal ?Basic overview and discussion of pathophysiology of Heart Failure reviewed ?Provided education on low sodium diet ?Reviewed Heart Failure Action Plan in depth and provided written  copy ?Discussed importance of daily weight and advised patient to weigh and record daily ?Discussed the importance of keeping all appointments with provider ? ?Patient Goals/Self-Care Activities: ?Take all medications as prescribed ?Attend all scheduled provider appointments ?Call pharmacy for medication refills 3-7 days in advance of running out of medications ?Perform all self care activities independently  ?Perform IADL's (shopping, preparing meals, housekeeping, managing finances) independently ?Call provider office for new concerns or questions  ?call the Suicide and Crisis Lifeline: 988 if experiencing a Mental Health or [place]  ?call office if I gain more than 2 pounds in one day or 5 pounds in one week ?use salt in moderation ?weigh myself daily ?track symptoms and what helps feel better or worse ?dress right for the weather, hot or cold ? ?Follow Up Plan:  Telephone follow up appointment with care management team member scheduled for:  August   2023 ?The patient has been provided with contact information for the care management team and has been advised to call with any health related questions or concerns.   ? ?[id number] Per patient wife he is monitoring weight frequently. He has not gained any weight. He is monitoring his blood pressure at home. His appetite is good. He has not had any recent falls. He is taking his medications as per ordered.  ?  ?  ? ?Plan: Telephone follow up appointment with care management team member scheduled for:  December 29, 2021 ?The patient has been provided with contact information for the care management team and has been advised to call with any health related questions or concerns.  ? ?[name] BSN RN ?[facility] ?[phone] ? ? ? ?  ?

## 2021-09-13 ENCOUNTER — Other Ambulatory Visit: Payer: Self-pay | Admitting: Cardiology

## 2021-09-16 ENCOUNTER — Other Ambulatory Visit: Payer: Self-pay | Admitting: Cardiology

## 2021-10-06 ENCOUNTER — Ambulatory Visit (INDEPENDENT_AMBULATORY_CARE_PROVIDER_SITE_OTHER): Payer: Medicare HMO

## 2021-10-06 VITALS — Ht 74.5 in | Wt 209.0 lb

## 2021-10-06 DIAGNOSIS — Z0000 Encounter for general adult medical examination without abnormal findings: Secondary | ICD-10-CM

## 2021-10-06 NOTE — Patient Instructions (Signed)
[name] , Thank you for taking time to come for your Medicare Wellness Visit. I appreciate your ongoing commitment to your health goals. Please review the following plan we discussed and let me know if I can assist you in the future.   Screening recommendations/referrals: Colonoscopy: No longer required due to age.  Recommended yearly ophthalmology/optometry visit for glaucoma screening and checkup Recommended yearly dental visit for hygiene and checkup  Vaccinations: Influenza vaccine: Due Fall 2023. Pneumococcal vaccine: Done 07/23/2014 and 07/30/2011. Tdap vaccine: Done 02/24/2020. Repeat in 10 years  Shingles vaccine: Discussed.   Covid-19: Declined.  Advanced directives: Advance directive discussed with you today. Even though you declined this today, please call our office should you change your mind, and we can give you the proper paperwork for you to fill out.   Conditions/risks identified: KEEP UP THE GOOD WORK!! Aim for 30 minutes of exercise or brisk walking, 6-8 glasses of water, and 5 servings of fruits and vegetables each day.   Next appointment: Follow up in one year for your annual wellness visit. 2024.  Preventive Care 37 Years and Older, Male  Preventive care refers to lifestyle choices and visits with your health care provider that can promote health and wellness. What does preventive care include? A yearly physical exam. This is also called an annual well check. Dental exams once or twice a year. Routine eye exams. Ask your health care provider how often you should have your eyes checked. Personal lifestyle choices, including: Daily care of your teeth and gums. Regular physical activity. Eating a healthy diet. Avoiding tobacco and drug use. Limiting alcohol use. Practicing safe sex. Taking low doses of aspirin every day. Taking vitamin and mineral supplements as recommended by your health care provider. What happens during an annual well check? The  services and screenings done by your health care provider during your annual well check will depend on your age, overall health, lifestyle risk factors, and family history of disease. Counseling  Your health care provider may ask you questions about your: Alcohol use. Tobacco use. Drug use. Emotional well-being. Home and relationship well-being. Sexual activity. Eating habits. History of falls. Memory and ability to understand (cognition). Work and work Statistician. Screening  You may have the following tests or measurements: Height, weight, and BMI. Blood pressure. Lipid and cholesterol levels. These may be checked every 5 years, or more frequently if you are over 85 years old. Skin check. Lung cancer screening. You may have this screening every year starting at age 54 if you have a 30-pack-year history of smoking and currently smoke or have quit within the past 15 years. Fecal occult blood test (FOBT) of the stool. You may have this test every year starting at age 63. Flexible sigmoidoscopy or colonoscopy. You may have a sigmoidoscopy every 5 years or a colonoscopy every 10 years starting at age 34. Prostate cancer screening. Recommendations will vary depending on your family history and other risks. Hepatitis C blood test. Hepatitis B blood test. Sexually transmitted disease (STD) testing. Diabetes screening. This is done by checking your blood sugar (glucose) after you have not eaten for a while (fasting). You may have this done every 1-3 years. Abdominal aortic aneurysm (AAA) screening. You may need this if you are a current or former smoker. Osteoporosis. You may be screened starting at age 47 if you are at high risk. Talk with your health care provider about your test results, treatment options, and if necessary, the need for more tests. Vaccines  Your health care provider may recommend certain vaccines, such as: Influenza vaccine. This is recommended every year. Tetanus,  diphtheria, and acellular pertussis (Tdap, Td) vaccine. You may need a Td booster every 10 years. Zoster vaccine. You may need this after age 79. Pneumococcal 13-valent conjugate (PCV13) vaccine. One dose is recommended after age 12. Pneumococcal polysaccharide (PPSV23) vaccine. One dose is recommended after age 73. Talk to your health care provider about which screenings and vaccines you need and how often you need them. This information is not intended to replace advice given to you by your health care provider. Make sure you discuss any questions you have with your health care provider. Document Released: 05/22/2015 Document Revised: 01/13/2016 Document Reviewed: 02/24/2015 Elsevier Interactive Patient Education  2017 [place] Prevention in the Home Falls can cause injuries. They can happen to people of all ages. There are many things you can do to make your home safe and to help prevent falls. What can I do on the outside of my home? Regularly fix the edges of walkways and driveways and fix any cracks. Remove anything that might make you trip as you walk through a door, such as a raised step or threshold. Trim any bushes or trees on the path to your home. Use bright outdoor lighting. Clear any walking paths of anything that might make someone trip, such as rocks or tools. Regularly check to see if handrails are loose or broken. Make sure that both sides of any steps have handrails. Any raised decks and porches should have guardrails on the edges. Have any leaves, snow, or ice cleared regularly. Use sand or salt on walking paths during winter. Clean up any spills in your garage right away. This includes oil or grease spills. What can I do in the bathroom? Use night lights. Install grab bars by the toilet and in the tub and shower. Do not use towel bars as grab bars. Use non-skid mats or decals in the tub or shower. If you need to sit down in the shower, use a plastic,  non-slip stool. Keep the floor dry. Clean up any water that spills on the floor as soon as it happens. Remove soap buildup in the tub or shower regularly. Attach bath mats securely with double-sided non-slip rug tape. Do not have throw rugs and other things on the floor that can make you trip. What can I do in the bedroom? Use night lights. Make sure that you have a light by your bed that is easy to reach. Do not use any sheets or blankets that are too big for your bed. They should not hang down onto the floor. Have a firm chair that has side arms. You can use this for support while you get dressed. Do not have throw rugs and other things on the floor that can make you trip. What can I do in the kitchen? Clean up any spills right away. Avoid walking on wet floors. Keep items that you use a lot in easy-to-reach places. If you need to reach something above you, use a strong step stool that has a grab bar. Keep electrical cords out of the way. Do not use floor polish or wax that makes floors slippery. If you must use wax, use non-skid floor wax. Do not have throw rugs and other things on the floor that can make you trip. What can I do with my stairs? Do not leave any items on the stairs. Make sure that there are  handrails on both sides of the stairs and use them. Fix handrails that are broken or loose. Make sure that handrails are as long as the stairways. Check any carpeting to make sure that it is firmly attached to the stairs. Fix any carpet that is loose or worn. Avoid having throw rugs at the top or bottom of the stairs. If you do have throw rugs, attach them to the floor with carpet tape. Make sure that you have a light switch at the top of the stairs and the bottom of the stairs. If you do not have them, ask someone to add them for you. What else can I do to help prevent falls? Wear shoes that: Do not have high heels. Have rubber bottoms. Are comfortable and fit you well. Are closed  at the toe. Do not wear sandals. If you use a stepladder: Make sure that it is fully opened. Do not climb a closed stepladder. Make sure that both sides of the stepladder are locked into place. Ask someone to hold it for you, if possible. Clearly mark and make sure that you can see: Any grab bars or handrails. First and last steps. Where the edge of each step is. Use tools that help you move around (mobility aids) if they are needed. These include: Canes. Walkers. Scooters. Crutches. Turn on the lights when you go into a dark area. Replace any light bulbs as soon as they burn out. Set up your furniture so you have a clear path. Avoid moving your furniture around. If any of your floors are uneven, fix them. If there are any pets around you, be aware of where they are. Review your medicines with your doctor. Some medicines can make you feel dizzy. This can increase your chance of falling. Ask your doctor what other things that you can do to help prevent falls. This information is not intended to replace advice given to you by your health care provider. Make sure you discuss any questions you have with your health care provider. Document Released: 02/19/2009 Document Revised: 10/01/2015 Document Reviewed: 05/30/2014 Elsevier Interactive Patient Education  2017 [employer].

## 2021-10-06 NOTE — Progress Notes (Signed)
Subjective:   [name] is a 81 y.o. male who presents for Medicare Annual/Subsequent preventive examination. Virtual Visit via Telephone Note  I connected with  [name] on 10/06/21 at  2:00 PM EDT by telephone and verified that I am speaking with the correct person using two identifiers.  Location: Patient: HOME Provider: [facility] Persons participating in the virtual visit: patient/Nurse Health Advisor   I discussed the limitations, risks, security and privacy concerns of performing an evaluation and management service by telephone and the availability of in person appointments. The patient expressed understanding and agreed to proceed.  Interactive audio and video telecommunications were attempted between this nurse and patient, however failed, due to patient having technical difficulties OR patient did not have access to video capability.  We continued and completed visit with audio only.  Some vital signs may be absent or patient reported.   Chriss Driver, LPN  Review of Systems     Cardiac Risk Factors include: advanced age (>71mn, >>60women);hypertension;dyslipidemia;male gender;sedentary lifestyle     Objective:    Today's Vitals   10/06/21 1401  Weight: 209 lb (94.8 kg)  Height: 6' 2.5" (1.892 m)   Body mass index is 26.48 kg/m.     10/06/2021    2:08 PM 08/25/2020    1:33 PM 02/14/2020    2:12 PM 07/24/2019   12:00 PM 09/03/2018    1:02 PM 07/23/2018    8:13 PM 07/18/2018   12:28 PM  Advanced Directives  Does Patient Have a Medical Advance Directive? No No No No No No No  Would patient like information on creating a medical advance directive? No - Patient declined No - Patient declined No - Patient declined No - Patient declined No - Patient declined No - Patient declined No - Patient declined    Current Medications (verified) Outpatient Encounter Medications as of 10/06/2021  Medication Sig   aspirin 81 MG EC tablet Take 1 tablet (81 mg total) by  mouth every other day.   clopidogrel (PLAVIX) 75 MG tablet Take 1 tablet by mouth once daily   ezetimibe (ZETIA) 10 MG tablet Take 1 tablet by mouth once daily   furosemide (LASIX) 40 MG tablet Take 40 mg by mouth daily as needed for fluid or edema.   isosorbide mononitrate (IMDUR) 30 MG 24 hr tablet Take 1 tablet by mouth once daily   nitroGLYCERIN (NITROSTAT) 0.4 MG SL tablet Place 1 tablet (0.4 mg total) under the tongue every 5 (five) minutes as needed for chest pain.   rosuvastatin (CRESTOR) 40 MG tablet TAKE 1 TABLET BY MOUTH ONCE DAILY AT 6 PM IN THE EVENING   Vitamin D, Ergocalciferol, (DRISDOL) 1.25 MG (50000 UNIT) CAPS capsule Take 1 capsule (50,000 Units total) by mouth once a week.   No facility-administered encounter medications on file as of 10/06/2021.    Allergies (verified) Brilinta [ticagrelor] and Ibuprofen   History: Past Medical History:  Diagnosis Date   Acute combined systolic and diastolic heart failure ([facility] 07/19/2018   Bradycardia    CAD in native artery    Foot drop, right foot    History of GI bleed    History of mitral valve prolapse    History of peptic ulcer disease    Hyperlipidemia, mixed    Hypertension    MI ([place]ocardial infarction) ([facility] [place], 2004   Neuromuscular disorder ([facility]    PUD, HX OF 01/03/2009   Qualifier: Diagnosis of  By: FOrville Govern CMA,  Arbie Cookey     STEMI involving left circumflex coronary artery ([place]) 07/18/2018   Past Surgical History:  Procedure Laterality Date   CORONARY STENT INTERVENTION N/A 07/18/2018   Procedure: CORONARY STENT INTERVENTION;  Surgeon: Martinique, Peter M, MD;  Location: [facility];  Service: Cardiovascular;  Laterality: N/A;   CORONARY STENT INTERVENTION N/A 07/20/2018   Procedure: CORONARY STENT INTERVENTION;  Surgeon: Martinique, Peter M, MD;  Location: [facility];  Service: Cardiovascular;  Laterality: N/A;   CORONARY STENT PLACEMENT  2004, 2007   5 in 2004, 1 in 2007   CORONARY/GRAFT ACUTE MI  REVASCULARIZATION N/A 07/18/2018   Procedure: Coronary/Graft Acute MI Revascularization;  Surgeon: Martinique, Peter M, MD;  Location: [facility];  Service: Cardiovascular;  Laterality: N/A;   LEFT HEART CATH AND CORONARY ANGIOGRAPHY N/A 07/18/2018   Procedure: LEFT HEART CATH AND CORONARY ANGIOGRAPHY;  Surgeon: Martinique, Peter M, MD;  Location: [facility];  Service: Cardiovascular;  Laterality: N/A;   Family History  Problem Relation Age of Onset   Heart attack Mother    Heart attack Father    Coronary artery disease Other    Cancer Daughter    Social History   Socioeconomic History   Marital status: Married    Spouse name: [place]   Number of children: 2   Years of education: 9   Highest education level: GED or equivalent  Occupational History   Occupation: Retired in 2006  Tobacco Use   Smoking status: Former    Packs/day: 3.00    Years: 20.00    Pack years: 60.00    Types: Cigarettes    Quit date: 05/09/1976    Years since quitting: 45.4   Smokeless tobacco: Never  Vaping Use   Vaping Use: Never used  Substance and Sexual Activity   Alcohol use: No   Drug use: No   Sexual activity: Not Currently    Partners: Female  Other Topics Concern   Not on file  Social History Narrative   Married and lives at home with his wife.   He has two children.    He is retired but stays active at home.   Right handed   Social Determinants of Health   Financial Resource Strain: Low Risk    Difficulty of Paying Living Expenses: Not hard at all  Food Insecurity: No Food Insecurity   Worried About Charity fundraiser in the Last Year: Never true   Ran Out of Food in the Last Year: Never true  Transportation Needs: No Transportation Needs   Lack of Transportation (Medical): No   Lack of Transportation (Non-Medical): No  Physical Activity: Inactive   Days of Exercise per Week: 0 days   Minutes of Exercise per Session: 0 min  Stress: No Stress Concern Present   Feeling of  Stress : Not at all  Social Connections: Socially Integrated   Frequency of Communication with Friends and Family: More than three times a week   Frequency of Social Gatherings with Friends and Family: More than three times a week   Attends Religious Services: More than 4 times per year   Active Member of [employer] or Organizations: Yes   Attends Music therapist: More than 4 times per year   Marital Status: Married    Tobacco Counseling Counseling given: Not Answered   Clinical Intake:  Pre-visit preparation completed: Yes  Pain : No/denies pain     BMI - recorded: 26.48 Nutritional  Status: BMI 25 -29 Overweight Nutritional Risks: None  How often do you need to have someone help you when you read instructions, pamphlets, or other written materials from your doctor or pharmacy?: 1 - Never  Diabetic?NO  Interpreter Needed?: No  Information entered by :: mj Dafna Romo, lpn   Activities of Daily Living    10/06/2021    2:11 PM  In your present state of health, do you have any difficulty performing the following activities:  Hearing? 1  Vision? 0  Difficulty concentrating or making decisions? 1  Walking or climbing stairs? 0  Dressing or bathing? 0  Doing errands, shopping? 0  Preparing Food and eating ? N  Using the Toilet? N  In the past six months, have you accidently leaked urine? N  Do you have problems with loss of bowel control? N  Managing your Medications? N  Managing your Finances? N  Housekeeping or managing your Housekeeping? N    Patient Care Team: Claretta Fraise, MD as PCP - General (Family Medicine) Harl Bowie Alphonse Guild, MD as PCP - Cardiology (Cardiology) Ilean China, RN as Case Manager Delice Lesch Lezlie Octave, MD as Consulting Physician (Neurology) [place], Eppie Gibson, RN as [place] any recent [place] you may have received from other than [facility] providers in the past year (date may be  approximate).     Assessment:   This is a routine wellness examination for [place].  Hearing/Vision screen Hearing Screening - Comments:: Some hearing issues.  Vision Screening - Comments:: Readers. No eye md  Dietary issues and exercise activities discussed: Current Exercise Habits: The patient does not participate in regular exercise at present, Exercise limited by: cardiac condition(s)   Goals Addressed             This Visit's Progress    DIET - INCREASE WATER INTAKE   On track    Try to drink 6-8 glasses of water daily     Exercise 150 min/wk Moderate Activity   Not on track    Plan meals   On track    Plan to eat at least 2 meals a day with a few snacks during the day      Prevent falls   On track    Move carefully and slowly. Wear your foot brace.         Depression Screen    10/06/2021    2:05 PM 08/24/2021    9:17 AM 02/23/2021    9:08 AM 08/25/2020    1:29 PM 08/24/2020    8:42 AM 02/24/2020    9:35 AM 10/11/2019    2:15 PM  PHQ 2/9 Scores  PHQ - 2 Score 0 0 0 0 0 0 0    Fall Risk    10/06/2021    2:08 PM 09/08/2021    2:30 PM 08/24/2021    9:16 AM 04/09/2021   10:13 AM 02/23/2021    9:08 AM  Fall Risk   Falls in the past year? '1 1 1 1 1  '$ Number falls in past yr: '1 1 1 1 1  '$ Injury with Fall? 0 0 0 0 0  Risk for fall due to : History of fall(s);Impaired balance/gait;Impaired mobility History of fall(s);Impaired balance/gait History of fall(s);Impaired balance/gait History of fall(s);Impaired balance/gait No Fall Risks;History of fall(s)  Follow up Falls prevention discussed Falls evaluation completed Falls evaluation completed Falls evaluation completed Falls evaluation completed    [place]:  Any stairs in or around the home? Yes  If so, are there any without handrails? No  Home free of loose throw rugs in walkways, pet beds, electrical cords, etc? Yes  Adequate lighting in your home to reduce risk of falls? Yes    ASSISTIVE DEVICES UTILIZED TO PREVENT FALLS:  Life alert? No  Use of a cane, walker or w/c? Yes  Grab bars in the bathroom? Yes  Shower chair or bench in shower? No  Elevated toilet seat or a handicapped toilet? Yes   TIMED UP AND GO:  Was the test performed? No .  Phone visit.   Cognitive Function:    07/18/2017    9:25 AM 02/24/2016    9:12 AM  MMSE - Mini Mental State Exam  Orientation to time 5 5  Orientation to Place 5 5  Registration 3 3  Attention/ Calculation 5 5  Recall 2 3  Language- name 2 objects 2 2  Language- repeat 1 1  Language- follow 3 step command 3 3  Language- read & follow direction 1 1  Write a sentence 1 1  Copy design 0 1  Total score 28 30      12/07/2018    3:00 PM 05/11/2018   10:00 AM 10/04/2017    9:00 AM  Montreal Cognitive Assessment   Visuospatial/ Executive (0/5)  5 4  Naming (0/3)  3 3  Attention: Read list of digits (0/2) '2 2 2  '$ Attention: Read list of letters (0/1) '1 1 1  '$ Attention: Serial 7 subtraction starting at 100 (0/3) '3 1 3  '$ Language: Repeat phrase (0/2) '2 2 2  '$ Language : Fluency (0/1) 0 0 0  Abstraction (0/2) '2 2 2  '$ Delayed Recall (0/5) '3 5 5  '$ Orientation (0/6) '4 6 6  '$ Total  27 28      10/06/2021    2:13 PM 08/25/2020    1:33 PM 07/24/2019   12:05 PM  6CIT Screen  What Year?  0 points 0 points  What month?  0 points 0 points  What time? 0 points 0 points 0 points  Count back from 20 0 points 0 points 0 points  Months in reverse 2 points 0 points 0 points  Repeat phrase 0 points 2 points 0 points  Total Score  2 points 0 points    Immunizations Immunization History  Administered Date(s) Administered   Influenza, High Dose Seasonal PF 02/09/2017, 01/24/2019   Influenza-Unspecified 01/23/2020   Pneumococcal Conjugate-13 07/23/2014   Pneumococcal Polysaccharide-23 07/30/2011   Tdap 02/24/2020    TDAP status: Up to date  Flu Vaccine status: Up to date  Pneumococcal vaccine status: Up to date  Covid-19  vaccine status: Declined, Education has been provided regarding the importance of this vaccine but patient still declined. Advised may receive this vaccine at local pharmacy or Health Dept.or vaccine clinic. Aware to provide a copy of the vaccination record if obtained from local pharmacy or Health Dept. Verbalized acceptance and understanding.  Qualifies for Shingles Vaccine? Yes   Zostavax completed No   Shingrix Completed?: No.    Education has been provided regarding the importance of this vaccine. Patient has been advised to call insurance company to determine out of pocket expense if they have not yet received this vaccine. Advised may also receive vaccine at local pharmacy or Health Dept. Verbalized acceptance and understanding.  Screening Tests Health Maintenance  Topic Date Due   Zoster Vaccines- Shingrix (1 of 2) 11/23/2021 (Originally [birth year]1991)  INFLUENZA VACCINE  12/07/2021   TETANUS/TDAP  02/23/2030   Pneumonia Vaccine 62+ Years old  Completed   HPV VACCINES  Aged Out   COVID-19 Vaccine  Discontinued    Health Maintenance  There are no preventive care reminders to display for this patient.  Colorectal cancer screening: No longer required.   Lung Cancer Screening: (Low Dose CT Chest recommended if Age 71-80 years, 30 pack-year currently smoking OR have quit w/in 15years.) does not qualify.   Additional Screening:  Hepatitis C Screening: does not qualify.  Vision Screening: Recommended annual ophthalmology exams for early detection of glaucoma and other disorders of the eye. Is the patient up to date with their annual eye exam?  No  Who is the provider or what is the name of the office in which the patient attends annual eye exams? N/A If pt is not established with a provider, would they like to be referred to a provider to establish care? No .   Dental Screening: Recommended annual dental exams for proper oral hygiene  Community Resource Referral / Chronic Care  Management: CRR required this visit?  No   CCM required this visit?  No      Plan:     I have personally reviewed and noted the following in the patient's chart:   Medical and social history Use of alcohol, tobacco or illicit drugs  Current medications and supplements including opioid prescriptions. Patient is not currently taking opioid prescriptions. Functional ability and status Nutritional status Physical activity Advanced directives List of other physicians Hospitalizations, surgeries, and ER visits in previous 12 months Vitals Screenings to include cognitive, depression, and falls Referrals and appointments  In addition, I have reviewed and discussed with patient certain preventive protocols, quality metrics, and best practice recommendations. A written personalized care plan for preventive services as well as general preventive health recommendations were provided to patient.     Chriss Driver, LPN   [id number]   Nurse Notes: Discussed Shingrix and how to obtain.

## 2021-10-28 ENCOUNTER — Other Ambulatory Visit: Payer: Self-pay | Admitting: Cardiology

## 2021-11-06 ENCOUNTER — Other Ambulatory Visit: Payer: Self-pay | Admitting: Cardiology

## 2021-11-15 ENCOUNTER — Ambulatory Visit: Payer: Self-pay | Admitting: *Deleted

## 2021-11-15 DIAGNOSIS — I10 Essential (primary) hypertension: Secondary | ICD-10-CM

## 2021-11-15 DIAGNOSIS — E785 Hyperlipidemia, unspecified: Secondary | ICD-10-CM

## 2021-11-15 NOTE — Chronic Care Management (AMB) (Signed)
   11/15/2021  ARTAVIUS STEARNS 06/16/1940 [id number]   Patient has not recently engaged with the CCM Team at [facility]. Removing RN Care Manager from Care Team and closing [place].   Chong Sicilian, BSN, RN-BC Embedded Chronic Care Manager [facility] Direct Dial: [phone]

## 2021-12-29 ENCOUNTER — Ambulatory Visit: Payer: Medicare HMO | Admitting: *Deleted

## 2022-01-03 ENCOUNTER — Encounter: Payer: Self-pay | Admitting: Cardiology

## 2022-01-03 ENCOUNTER — Ambulatory Visit: Payer: Medicare HMO | Attending: Cardiology | Admitting: Cardiology

## 2022-01-03 VITALS — BP 120/82 | HR 60 | Ht 74.5 in | Wt 210.2 lb

## 2022-01-03 DIAGNOSIS — E782 Mixed hyperlipidemia: Secondary | ICD-10-CM | POA: Diagnosis not present

## 2022-01-03 DIAGNOSIS — I10 Essential (primary) hypertension: Secondary | ICD-10-CM

## 2022-01-03 DIAGNOSIS — I2510 Atherosclerotic heart disease of native coronary artery without angina pectoris: Secondary | ICD-10-CM | POA: Diagnosis not present

## 2022-01-03 DIAGNOSIS — I5032 Chronic diastolic (congestive) heart failure: Secondary | ICD-10-CM

## 2022-01-03 NOTE — Patient Instructions (Signed)
Medication Instructions:  Continue all current medications.   Labwork: none  Testing/Procedures: none  Follow-Up: 6 months   Any Other Special Instructions Will Be Listed Below (If Applicable).   If you need a refill on your cardiac medications before your next appointment, please call your pharmacy.  

## 2022-01-03 NOTE — Progress Notes (Signed)
Clinical Summary [name] is a 81 y.o.male male seen today for follow up of the following medical problems.    1. CAD - multiple PCIs in the past - hx of MI in 2004, with stenting to RCA (ostium down to PDA). Of note had GI bleed 2 months later on DAPT and NSAIDs requiring transfusion.   - cath from 2007 with occluded LCX OM, received a BMS. RCA stents were patent at that time. LVEF 70% by LV gram at that time, no echoes in our system     - admit 07/2018 with acute posterior STEMI, taken to cath lab - 07/2018 cath showed ostal LAD 60%, mid LAD 99%, D1 95%, OM1 occluded, RCA 30%, mid RCA 95%. Received DES to OM1, then staged PCI to RCA. From notes no options on treating LAD, diffusely diseased and no target for CABG. Recs for indefinitie DAPT. LVEDP 29, limited diuresis due to uptrend in Cr.     - no recent chest pain, no SOB/DOE - compliant with meds     2. Chronic diastolic HF - has prn lasix but has required.     3. HTN - compliant with meds   4. Hyperlipidemia   - 02/2020 TC 142 TG 87 HDL 51 LDL 74 - 08/2020 TC 138 TG 114 HL 45 LDL 72 - he is on crestor 40, zetia 10   02/2021 TC 160 TG 107 HDL 52 LDL 89   5. Chronic bradycardia - some orthostatic symptoms at times.      Past Medical History:  Diagnosis Date   Acute combined systolic and diastolic heart failure ([place]) 07/19/2018   Bradycardia    CAD in native artery    Foot drop, right foot    History of GI bleed    History of mitral valve prolapse    History of peptic ulcer disease    Hyperlipidemia, mixed    Hypertension    MI (myocardial infarction) ([place]) 2000, 2004   Neuromuscular disorder ([place])    PUD, HX OF 01/03/2009   Qualifier: Diagnosis of  By: Orville Govern CMA, Carol     STEMI involving left circumflex coronary artery ([place]) 07/18/2018     Allergies  Allergen Reactions   Brilinta [Ticagrelor] Shortness Of Breath   Ibuprofen Nausea Only     Current Outpatient Medications  Medication Sig Dispense  Refill   aspirin 81 MG EC tablet Take 1 tablet (81 mg total) by mouth every other day. 30 tablet    clopidogrel (PLAVIX) 75 MG tablet Take 1 tablet by mouth once daily 90 tablet 3   ezetimibe (ZETIA) 10 MG tablet Take 1 tablet by mouth once daily 90 tablet 1   furosemide (LASIX) 40 MG tablet TAKE 1 TABLET BY MOUTH ONCE DAILY AS NEEDED FOR LEG SWELLING OR SHORTNESS OF BREATH 90 tablet 1   isosorbide mononitrate (IMDUR) 30 MG 24 hr tablet Take 1 tablet by mouth once daily 90 tablet 1   nitroGLYCERIN (NITROSTAT) 0.4 MG SL tablet Place 1 tablet (0.4 mg total) under the tongue every 5 (five) minutes as needed for chest pain. 25 tablet 3   rosuvastatin (CRESTOR) 40 MG tablet TAKE 1 TABLET BY MOUTH ONCE DAILY AT 6 PM IN THE EVENING 90 tablet 2   Vitamin D, Ergocalciferol, (DRISDOL) 1.25 MG (50000 UNIT) CAPS capsule Take 1 capsule (50,000 Units total) by mouth once a week. 13 capsule 0   No current facility-administered medications for this visit.  Past Surgical History:  Procedure Laterality Date   CORONARY STENT INTERVENTION N/A 07/18/2018   Procedure: CORONARY STENT INTERVENTION;  Surgeon: Martinique, Peter M, MD;  Location: [facility];  Service: Cardiovascular;  Laterality: N/A;   CORONARY STENT INTERVENTION N/A 07/20/2018   Procedure: CORONARY STENT INTERVENTION;  Surgeon: Martinique, Peter M, MD;  Location: [facility];  Service: Cardiovascular;  Laterality: N/A;   CORONARY STENT PLACEMENT  2004, 2007   5 in 2004, 1 in 2007   CORONARY/GRAFT ACUTE MI REVASCULARIZATION N/A 07/18/2018   Procedure: Coronary/Graft Acute MI Revascularization;  Surgeon: Martinique, Peter M, MD;  Location: [facility];  Service: Cardiovascular;  Laterality: N/A;   LEFT HEART CATH AND CORONARY ANGIOGRAPHY N/A 07/18/2018   Procedure: LEFT HEART CATH AND CORONARY ANGIOGRAPHY;  Surgeon: Martinique, Peter M, MD;  Location: [facility];  Service: Cardiovascular;  Laterality: N/A;     Allergies  Allergen  Reactions   Brilinta [Ticagrelor] Shortness Of Breath   Ibuprofen Nausea Only      Family History  Problem Relation Age of Onset   Heart attack Mother    Heart attack Father    Coronary artery disease Other    Cancer Daughter      Social History [name] reports that he quit smoking about 45 years ago. His smoking use included cigarettes. He has a 60.00 pack-year smoking history. He has never used smokeless tobacco. [name] reports no history of alcohol use.   Review of Systems CONSTITUTIONAL: No weight loss, fever, chills, weakness or fatigue.  HEENT: Eyes: No visual loss, blurred vision, double vision or yellow sclerae.No hearing loss, sneezing, congestion, runny nose or sore throat.  SKIN: No rash or itching.  CARDIOVASCULAR: RRR, no m/r/g, no jvd RESPIRATORY: No shortness of breath, cough or sputum.  GASTROINTESTINAL: No anorexia, nausea, vomiting or diarrhea. No abdominal pain or blood.  GENITOURINARY: No burning on urination, no polyuria NEUROLOGICAL: No headache, dizziness, syncope, paralysis, ataxia, numbness or tingling in the extremities. No change in bowel or bladder control.  MUSCULOSKELETAL: No muscle, back pain, joint pain or stiffness.  LYMPHATICS: No enlarged nodes. No history of splenectomy.  PSYCHIATRIC: No history of depression or anxiety.  ENDOCRINOLOGIC: No reports of sweating, cold or heat intolerance. No polyuria or polydipsia.  [name]   Physical Examination Today's Vitals   01/03/22 0811  BP: 120/82  Pulse: 60  SpO2: 95%  Weight: 210 lb 3.2 oz (95.3 kg)  Height: 6' 2.5" (1.892 m)   Body mass index is 26.63 kg/m.  Gen: resting comfortably, no acute distress HEENT: no scleral icterus, pupils equal round and reactive, no palptable cervical adenopathy,  CV: RRR, no m/r/g, no jvd Resp: Clear to auscultation bilaterally GI: abdomen is soft, non-tender, non-distended, normal bowel sounds, no hepatosplenomegaly MSK: extremities are warm, no edema.   Skin: warm, no rash Neuro:  no focal deficits Psych: appropriate affect   Diagnostic Studies  Cath 2007 Central aortic pressure is 101/68 with a mean of 84. LV pressure is 94/1   with an EDP of 6. There is no aortic stenosis.   Left main was normal.   LAD was a large vessel coursing to the apex. There was very prominent   severe ectasia in the proximal portion with aneurysmal dilatation and   swirling flow. However, no high grade stenosis. In the mid LAD, there was   a diffuse 40-50% segmental plaquing. In the distal LAD, there was a tubular   70-80%  stenosis. There also were two diagonals. The first diagonal is a   medium vessel with a long 90% stenosis. The second diagonal was small with   branching vessel with a 70-80% proximal lesion.   The left circumflex was a moderate sized system. It gave off a large   branching OM-1 and a small OM-2. There is a 40% proximal lesion in the   circumflex. The lower Jaquasha [place] of the OM was totally occluded and the distal   part of the vessel filled through collaterals with TIMI I flow. There is a   40% blockage in the upper portion of the OM.   The right coronary artery was a large dominant vessel. It gave off a   moderate sized PDA and two small PLs. There was evidence of previously   placed stent from the ostial portion down to the PDA. There was 40% ostial   stenosis with damping of the 6-French catheter. This did not change with   sublingual nitroglycerin. There is a 30% lesion in the proximal to mid   portion.   Left ventriculogram done the RAO position showed an EF of 70% with mild   mitral valve prolapse. There was no wall motion abnormalities and there was   no mitral regurgitation. Left ventriculogram done in the LAO position   showed an EF of 70% with no regional wall motion abnormalities.   Abdominal aortogram showed patent renal arteries bilaterally with no   abdominal aortic aneurysm.   A left subclavian done for possible bypass  surgery showed a patent LIMA to   the chest wall with no obstructions in the subclavian.   ASSESSMENT:   1. Three vessel native coronary artery disease.   2. The LAD has diffuse ectasia and swelling flow proximally as described   above. There is a borderline lesion in the distal LAD and a high grade   disease in the diagonal, but these do not appear to be the culprit   lesion.   3. Totally occluded OM1 which appears to be the culprit.   4. Patent right coronary stents.   5. Normal LV function with mild mitral valve prolapse but no significant   mitral regurgitation.   CONCLUSION: Successful PCI of a totally occluded circumflex marginal vessel   with improvement in narrowing from 100% to 0% and improvement in flow from   TIMI 0 to TIMI III flow using a bare metal stent.     07/2018 echo IMPRESSIONS      1. The left ventricle has low normal systolic function, with an ejection fraction of 50-55%. The cavity size was normal. Left ventricular diastolic Doppler parameters are indeterminate.  2. Mild hypokinesis of the left ventricular, mid-apical inferoseptal wall, inferior segment and inferolateral wall.  3. The right ventricle has normal systolic function. The cavity was normal. There is no increase in right ventricular wall thickness.  4. The aortic valve is tricuspid Mild thickening of the aortic valve Mild calcification of the aortic valve. Aortic valve regurgitation was not assessed by color flow Doppler.     07/2018 cath Previously placed Mid RCA to Dist RCA stent (unknown type) is widely patent. Ost RCA to Mid RCA lesion is 30% stenosed. Mid RCA lesion is 95% stenosed. Ost 1st Mrg to 1st Mrg lesion is 100% stenosed. Ost LAD to Prox LAD lesion is 60% stenosed. Prox LAD lesion is 25% stenosed. Ost 1st Diag to 1st Diag lesion is 95% stenosed. Mid LAD to Dist LAD lesion is  99% stenosed. Post intervention, there is a 0% residual stenosis. A drug-eluting stent was successfully placed  using a STENT SYNERGY DES 3X24. LV end diastolic pressure is moderately elevated.   1. 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 Diffuse 99% stenosis in the mid to distal LAD. 95% bifurcating first diagonal.    - 100% occlusion of the large first OM at site of prior stent. This is the culprit lesion   - 95% mid RCA in stent 2. Moderately elevated LVEDP 3. Successful PCI of the OM 1 with DES x1   Discussed with Dr. Gwenlyn Found. There are no options for treating the LAD. It is diffusely diseased and there is no target for CABG. There are some right to left collaterals to the distal LAD. We proceeded with emergent PCI of the OM1. Recommend DAPT indefinitely given multiple layers of stent. Plan to bring patient back in 2 days for PCI of the RCA. Will treat the LAD medically. Check LV function by Echo. Maximize medical therapy. Stress importance of medication compliance.      07/2018 staged PCI Mid RCA lesion is 95% stenosed. A drug-eluting stent was successfully placed using a STENT SYNERGY DES 3.5X16. Post intervention, there is a 0% residual stenosis.   1. Successful PCI of the mid RCA with DES x 1.   Plan: continue DAPT indefinitely. Anticipate DC home in am.   Assessment and Plan   1. CAD - he is committed to indefinite DAPT as reported above -  Off beta blocker due to bradycardia.  - no symptoms, continue current meds   2. Chronic diastolic HF - euvolemic without symptoms, continue prn lasix   3. HTN - he is at goal, continue current meds   4. Hyperlipidemia -f/u upcoming labs     Arnoldo Lenis, M.D.

## 2022-02-03 ENCOUNTER — Ambulatory Visit (INDEPENDENT_AMBULATORY_CARE_PROVIDER_SITE_OTHER): Payer: Medicare HMO | Admitting: Family Medicine

## 2022-02-03 ENCOUNTER — Encounter: Payer: Self-pay | Admitting: Family Medicine

## 2022-02-03 VITALS — BP 133/85 | HR 63 | Temp 98.0°F | Ht 74.5 in | Wt 211.0 lb

## 2022-02-03 DIAGNOSIS — H9193 Unspecified hearing loss, bilateral: Secondary | ICD-10-CM | POA: Diagnosis not present

## 2022-02-03 DIAGNOSIS — H6123 Impacted cerumen, bilateral: Secondary | ICD-10-CM

## 2022-02-03 NOTE — Progress Notes (Signed)
BP 133/85   Pulse 63   Temp 98 F (36.7 C)   Ht 6' 2.5" (1.892 m)   Wt 211 lb (95.7 kg)   SpO2 97%   BMI 26.73 kg/m    Subjective:   Patient ID: [name], male    DOB: October 24, 1940, 81 y.o.   MRN: [id number]  HPI: [name] is a 81 y.o. male presenting on 02/03/2022 for wax build up (Needs hearing aids. Per audiologist pt needs to have wax cleaned out prior to testing)   HPI Earwax/hearing problems Patient is having earwax and hearing problems.  He went to an audiologist and they want to do the testing for hearing aids but could not because of the cerumen, especially on the right side.  He says his hearing has been worsening gradually over the past few years so he does not think it is just the wax but they could not do the testing with the wax there.  He is coming in today for cerumen lavage.  He denies any fever chills pain or drainage.  Relevant past medical, surgical, family and social history reviewed and updated as indicated. Interim medical history since our last visit reviewed. Allergies and medications reviewed and updated.  Review of Systems  Constitutional:  Negative for chills and fever.  HENT:  Positive for hearing loss. Negative for congestion, ear discharge and ear pain.   Respiratory:  Negative for shortness of breath and wheezing.   Cardiovascular:  Negative for chest pain and leg swelling.  Skin:  Negative for rash.  All other systems reviewed and are negative.   Per HPI unless specifically indicated above   Allergies as of 02/03/2022       Reactions   Brilinta [ticagrelor] Shortness Of Breath   Ibuprofen Nausea Only        Medication List        Accurate as of February 03, 2022  8:44 AM. If you have any questions, ask your nurse or doctor.          aspirin EC 81 MG tablet Take 1 tablet (81 mg total) by mouth every other day.   clopidogrel 75 MG tablet Commonly known as: PLAVIX Take 1 tablet by mouth once daily   ezetimibe 10 MG  tablet Commonly known as: ZETIA Take 1 tablet by mouth once daily   furosemide 40 MG tablet Commonly known as: LASIX TAKE 1 TABLET BY MOUTH ONCE DAILY AS NEEDED FOR LEG SWELLING OR SHORTNESS OF BREATH   isosorbide mononitrate 30 MG 24 hr tablet Commonly known as: IMDUR Take 1 tablet by mouth once daily   nitroGLYCERIN 0.4 MG SL tablet Commonly known as: NITROSTAT Place 1 tablet (0.4 mg total) under the tongue every 5 (five) minutes as needed for chest pain.   rosuvastatin 40 MG tablet Commonly known as: CRESTOR TAKE 1 TABLET BY MOUTH ONCE DAILY AT 6 PM IN THE EVENING   Vitamin D (Ergocalciferol) 1.25 MG (50000 UNIT) Caps capsule Commonly known as: DRISDOL Take 1 capsule (50,000 Units total) by mouth once a week.         Objective:   BP 133/85   Pulse 63   Temp 98 F (36.7 C)   Ht 6' 2.5" (1.892 m)   Wt 211 lb (95.7 kg)   SpO2 97%   BMI 26.73 kg/m   Wt Readings from Last 3 Encounters:  02/03/22 211 lb (95.7 kg)  01/03/22 210 lb 3.2 oz (95.3 kg)  10/06/21 209  lb (94.8 kg)    Physical Exam Vitals and nursing note reviewed.  Constitutional:      Appearance: Normal appearance.  HENT:     Right Ear: There is impacted cerumen.     Left Ear:  No middle ear effusion. There is no impacted cerumen.  Neurological:     Mental Status: He is alert.     Cerumen lavage: Nurse to lavage cerumen, really it is the right ear that needs it but will lavage the left ear, minimal wax in that ear.  Assessment & Plan:   Problem List Items Addressed This Visit   None Visit Diagnoses     Bilateral impacted cerumen    -  Primary   Bilateral hearing loss, unspecified hearing loss type           Unable to fully clear wax, recommended doing Debrox at home and then coming back in 2 weeks Follow up plan: Return if symptoms worsen or fail to improve, for 2-week earwax removal.  Counseling provided for all of the vaccine components No orders of the defined types were placed in  this encounter.   Caryl Pina, MD [facility] 02/03/2022, 8:44 AM

## 2022-02-17 ENCOUNTER — Ambulatory Visit: Payer: Medicare HMO | Admitting: Family Medicine

## 2022-02-23 ENCOUNTER — Encounter: Payer: Self-pay | Admitting: Family Medicine

## 2022-02-23 ENCOUNTER — Ambulatory Visit (INDEPENDENT_AMBULATORY_CARE_PROVIDER_SITE_OTHER): Payer: Medicare HMO | Admitting: Family Medicine

## 2022-02-23 VITALS — BP 98/62 | HR 61 | Temp 97.7°F | Ht 74.5 in | Wt 206.4 lb

## 2022-02-23 DIAGNOSIS — E785 Hyperlipidemia, unspecified: Secondary | ICD-10-CM | POA: Diagnosis not present

## 2022-02-23 DIAGNOSIS — I10 Essential (primary) hypertension: Secondary | ICD-10-CM | POA: Diagnosis not present

## 2022-02-23 DIAGNOSIS — I5042 Chronic combined systolic (congestive) and diastolic (congestive) heart failure: Secondary | ICD-10-CM | POA: Diagnosis not present

## 2022-02-23 DIAGNOSIS — H6123 Impacted cerumen, bilateral: Secondary | ICD-10-CM

## 2022-02-23 NOTE — Progress Notes (Signed)
Subjective:  Patient ID: [name], male    DOB: 1941/03/27  Age: 81 y.o. MRN: [id number]  CC: Medical Management of Chronic Issues   HPI CADENCE HASLAM presents for  follow-up of hypertension. Patient has no history of headache chest pain or shortness of breath or recent cough. Patient also denies symptoms of TIA such as focal numbness or weakness. Patient denies side effects from medication. States taking it regularly.  Still having hearing and wax problem for which he was seen 2-3 weeks ago. Needs wax removal today in order to have hearing tested. Seen at [facility] in [place]. They sent him back for wax removal.    History Kyriakos has a past medical history of Acute combined systolic and diastolic heart failure ([place]) (07/19/2018), Bradycardia, CAD in native artery, Foot drop, right foot, History of GI bleed, History of mitral valve prolapse, History of peptic ulcer disease, Hyperlipidemia, mixed, Hypertension, MI (myocardial infarction) ([place]) (2000, 2004), Neuromuscular disorder ([place]), PUD, HX OF (01/03/2009), and STEMI involving left circumflex coronary artery ([place]) (07/18/2018).   He has a past surgical history that includes Coronary stent placement (2004, 2007); LEFT HEART CATH AND CORONARY ANGIOGRAPHY (N/A, 07/18/2018); Coronary/Graft Acute MI Revascularization (N/A, 07/18/2018); CORONARY STENT INTERVENTION (N/A, 07/18/2018); and CORONARY STENT INTERVENTION (N/A, 07/20/2018).   His family history includes Cancer in his daughter; Coronary artery disease in an other family member; Heart attack in his father and mother.He reports that he quit smoking about 45 years ago. His smoking use included cigarettes. He has a 60.00 pack-year smoking history. He has never used smokeless tobacco. He reports that he does not drink alcohol and does not use drugs.  Current Outpatient Medications on File Prior to Visit  Medication Sig Dispense Refill   aspirin 81 MG EC tablet Take 1 tablet (81 mg total) by  mouth every other day. 30 tablet    clopidogrel (PLAVIX) 75 MG tablet Take 1 tablet by mouth once daily 90 tablet 3   ezetimibe (ZETIA) 10 MG tablet Take 1 tablet by mouth once daily 90 tablet 1   furosemide (LASIX) 40 MG tablet TAKE 1 TABLET BY MOUTH ONCE DAILY AS NEEDED FOR LEG SWELLING OR SHORTNESS OF BREATH 90 tablet 1   isosorbide mononitrate (IMDUR) 30 MG 24 hr tablet Take 1 tablet by mouth once daily 90 tablet 1   nitroGLYCERIN (NITROSTAT) 0.4 MG SL tablet Place 1 tablet (0.4 mg total) under the tongue every 5 (five) minutes as needed for chest pain. 25 tablet 3   rosuvastatin (CRESTOR) 40 MG tablet TAKE 1 TABLET BY MOUTH ONCE DAILY AT 6 PM IN THE EVENING 90 tablet 2   No current facility-administered medications on file prior to visit.    ROS Review of Systems  Constitutional:  Negative for fever.  HENT:  Positive for hearing loss.   Respiratory:  Negative for shortness of breath.   Cardiovascular:  Negative for chest pain.  Musculoskeletal:  Negative for arthralgias.  Skin:  Negative for rash.    Objective:  BP 98/62   Pulse 61   Temp 97.7 F (36.5 C)   Ht 6' 2.5" (1.892 m)   Wt 206 lb 6.4 oz (93.6 kg)   SpO2 96%   BMI 26.15 kg/m   BP Readings from Last 3 Encounters:  02/23/22 98/62  02/03/22 133/85  01/03/22 120/82    Wt Readings from Last 3 Encounters:  02/23/22 206 lb 6.4 oz (93.6 kg)  02/03/22 211 lb (95.7 kg)  01/03/22  210 lb 3.2 oz (95.3 kg)     Physical Exam Vitals reviewed.  Constitutional:      Appearance: He is well-developed.  HENT:     Head: Normocephalic and atraumatic.     Right Ear: External ear normal. There is impacted cerumen.     Left Ear: External ear normal. There is impacted cerumen.     Mouth/Throat:     Pharynx: No oropharyngeal exudate or posterior oropharyngeal erythema.  Eyes:     Pupils: Pupils are equal, round, and reactive to light.  Cardiovascular:     Rate and Rhythm: Normal rate and regular rhythm.     Heart sounds:  No murmur heard. Pulmonary:     Effort: No respiratory distress.     Breath sounds: Normal breath sounds.  Musculoskeletal:     Cervical back: Normal range of motion and neck supple.  Neurological:     Mental Status: He is alert and oriented to person, place, and time.       Assessment & Plan:   Jashon was seen today for medical management of chronic issues.  Diagnoses and all orders for this visit:  Chronic combined systolic and diastolic congestive heart failure ([place])  Essential hypertension, benign  Hyperlipidemia with target LDL less than 70  Hearing loss of both ears due to cerumen impaction   Allergies as of 02/23/2022       Reactions   Brilinta [ticagrelor] Shortness Of Breath   Ibuprofen Nausea Only        Medication List        Accurate as of February 23, 2022  9:31 AM. If you have any questions, ask your nurse or doctor.          STOP taking these medications    Vitamin D (Ergocalciferol) 1.25 MG (50000 UNIT) Caps capsule Commonly known as: DRISDOL Stopped by: Claretta Fraise, MD       TAKE these medications    aspirin EC 81 MG tablet Take 1 tablet (81 mg total) by mouth every other day.   clopidogrel 75 MG tablet Commonly known as: PLAVIX Take 1 tablet by mouth once daily   ezetimibe 10 MG tablet Commonly known as: ZETIA Take 1 tablet by mouth once daily   furosemide 40 MG tablet Commonly known as: LASIX TAKE 1 TABLET BY MOUTH ONCE DAILY AS NEEDED FOR LEG SWELLING OR SHORTNESS OF BREATH   isosorbide mononitrate 30 MG 24 hr tablet Commonly known as: IMDUR Take 1 tablet by mouth once daily   nitroGLYCERIN 0.4 MG SL tablet Commonly known as: NITROSTAT Place 1 tablet (0.4 mg total) under the tongue every 5 (five) minutes as needed for chest pain.   rosuvastatin 40 MG tablet Commonly known as: CRESTOR TAKE 1 TABLET BY MOUTH ONCE DAILY AT 6 PM IN THE EVENING        No orders of the defined types were placed in this  encounter.     Follow-up: Return in about 6 months (around 08/25/2022).  Claretta Fraise, M.D.

## 2022-02-24 LAB — CBC WITH DIFFERENTIAL/PLATELET
Basophils Absolute: 0 10*3/uL (ref 0.0–0.2)
Basos: 1 %
EOS (ABSOLUTE): 0.1 10*3/uL (ref 0.0–0.4)
Eos: 3 %
Hematocrit: 44 % (ref 37.5–51.0)
Hemoglobin: 14.8 g/dL (ref 13.0–17.7)
Immature Grans (Abs): 0 10*3/uL (ref 0.0–0.1)
Immature Granulocytes: 0 %
Lymphocytes Absolute: 1.3 10*3/uL (ref 0.7–3.1)
Lymphs: 28 %
MCH: 30.5 pg (ref 26.6–33.0)
MCHC: 33.6 g/dL (ref 31.5–35.7)
MCV: 91 fL (ref 79–97)
Monocytes Absolute: 0.4 10*3/uL (ref 0.1–0.9)
Monocytes: 10 %
Neutrophils Absolute: 2.7 10*3/uL (ref 1.4–7.0)
Neutrophils: 58 %
Platelets: 214 10*3/uL (ref 150–450)
RBC: 4.86 x10E6/uL (ref 4.14–5.80)
RDW: 12.4 % (ref 11.6–15.4)
WBC: 4.5 10*3/uL (ref 3.4–10.8)

## 2022-02-24 LAB — CMP14+EGFR
ALT: 18 IU/L (ref 0–44)
AST: 21 IU/L (ref 0–40)
Albumin/Globulin Ratio: 2.1 (ref 1.2–2.2)
Albumin: 4.4 g/dL (ref 3.8–4.8)
Alkaline Phosphatase: 113 IU/L (ref 44–121)
BUN/Creatinine Ratio: 14 (ref 10–24)
BUN: 19 mg/dL (ref 8–27)
Bilirubin Total: 1.4 mg/dL — ABNORMAL HIGH (ref 0.0–1.2)
CO2: 22 mmol/L (ref 20–29)
Calcium: 9 mg/dL (ref 8.6–10.2)
Chloride: 108 mmol/L — ABNORMAL HIGH (ref 96–106)
Creatinine, Ser: 1.34 mg/dL — ABNORMAL HIGH (ref 0.76–1.27)
Globulin, Total: 2.1 g/dL (ref 1.5–4.5)
Glucose: 97 mg/dL (ref 70–99)
Potassium: 4.6 mmol/L (ref 3.5–5.2)
Sodium: 144 mmol/L (ref 134–144)
Total Protein: 6.5 g/dL (ref 6.0–8.5)
eGFR: 54 mL/min/{1.73_m2} — ABNORMAL LOW (ref >59)

## 2022-02-24 LAB — LIPID PANEL
Chol/HDL Ratio: 3.1 ratio (ref 0.0–5.0)
Cholesterol, Total: 153 mg/dL (ref 100–199)
HDL: 50 mg/dL (ref >39)
LDL Chol Calc (NIH): 87 mg/dL (ref 0–99)
Triglycerides: 87 mg/dL (ref 0–149)
VLDL Cholesterol Cal: 16 mg/dL (ref 5–40)

## 2022-02-26 NOTE — Progress Notes (Signed)
Hello Rawn,  Your lab result is normal and/or stable.Some minor variations that are not significant are commonly marked abnormal, but do not represent any medical problem for you.  Best regards, Smt Lokey, M.D.

## 2022-03-24 ENCOUNTER — Telehealth: Payer: Self-pay | Admitting: Cardiology

## 2022-03-24 MED ORDER — ISOSORBIDE MONONITRATE ER 30 MG PO TB24: 30.0000 mg | ORAL_TABLET | Freq: Every day | ORAL | 2 refills | Status: DC

## 2022-03-24 NOTE — Telephone Encounter (Signed)
Done

## 2022-03-24 NOTE — Telephone Encounter (Signed)
*  STAT* If patient is at the pharmacy, call can be transferred to refill team.   1. Which medications need to be refilled? (please list name of each medication and dose if known)  Isosorbide  2. Which pharmacy/location (including street and city if local pharmacy) is medication to be sent to?  [facility] [place]  3. Do they need a 30 day or 90 day supply? 90 days and refills

## 2022-05-16 ENCOUNTER — Other Ambulatory Visit: Payer: Self-pay | Admitting: Cardiology

## 2022-07-01 ENCOUNTER — Encounter: Payer: Self-pay | Admitting: Cardiology

## 2022-07-01 ENCOUNTER — Ambulatory Visit: Payer: Medicare HMO | Attending: Cardiology | Admitting: Cardiology

## 2022-07-01 VITALS — BP 120/80 | HR 70 | Ht 74.5 in | Wt 212.6 lb

## 2022-07-01 DIAGNOSIS — I10 Essential (primary) hypertension: Secondary | ICD-10-CM

## 2022-07-01 DIAGNOSIS — I2510 Atherosclerotic heart disease of native coronary artery without angina pectoris: Secondary | ICD-10-CM

## 2022-07-01 DIAGNOSIS — I5032 Chronic diastolic (congestive) heart failure: Secondary | ICD-10-CM

## 2022-07-01 DIAGNOSIS — E782 Mixed hyperlipidemia: Secondary | ICD-10-CM

## 2022-07-01 MED ORDER — ROSUVASTATIN CALCIUM 40 MG PO TABS: ORAL_TABLET | ORAL | 3 refills | Status: DC

## 2022-07-01 NOTE — Progress Notes (Signed)
Clinical Summary [name] is a 82 y.o.male seen today for follow up of the following medical problems.    1. CAD - multiple PCIs in the past - hx of MI in 2004, with stenting to RCA (ostium down to PDA). Of note had GI bleed 2 months later on DAPT and NSAIDs requiring transfusion.   - cath from 2007 with occluded LCX OM, received a BMS. RCA stents were patent at that time. LVEF 70% by LV gram at that time, no echoes in our system     - admit 07/2018 with acute posterior STEMI, taken to cath lab - 07/2018 cath showed ostal LAD 60%, mid LAD 99%, D1 95%, OM1 occluded, RCA 30%, mid RCA 95%. Received DES to OM1, then staged PCI to RCA. From notes no options on treating LAD, diffusely diseased and no target for CABG. Recs for indefinitie DAPT. LVEDP 29, limited diuresis due to uptrend in Cr.     -no chest pains, no SOB/DOE - compliant with meds     2. Chronic diastolic HF - no SOB./DOE, no LE edema - has lasix prn but does not need often   3. HTN - compliant with meds   4. Hyperlipidemia   - 02/2020 TC 142 TG 87 HDL 51 LDL 74 - 08/2020 TC 138 TG 114 HL 45 LDL 72 - he is on crestor 40, zetia 10   02/2021 TC 160 TG 107 HDL 52 LDL 89 -02/2022 TC 153 TG 87 HDL 50 LDL 87   5. Chronic bradycardia - has not been a recent issue   Past Medical History:  Diagnosis Date   Acute combined systolic and diastolic heart failure ([place]) 07/19/2018   Bradycardia    CAD in native artery    Foot drop, right foot    History of GI bleed    History of mitral valve prolapse    History of peptic ulcer disease    Hyperlipidemia, mixed    Hypertension    MI (myocardial infarction) ([place]) 2000, 2004   Neuromuscular disorder ([place])    PUD, HX OF 01/03/2009   Qualifier: Diagnosis of  By: Orville Govern CMA, Carol     STEMI involving left circumflex coronary artery ([place]) 07/18/2018     Allergies  Allergen Reactions   Brilinta [Ticagrelor] Shortness Of Breath   Ibuprofen Nausea Only     Current  Outpatient Medications  Medication Sig Dispense Refill   aspirin 81 MG EC tablet Take 1 tablet (81 mg total) by mouth every other day. 30 tablet    clopidogrel (PLAVIX) 75 MG tablet Take 1 tablet by mouth once daily 90 tablet 3   ezetimibe (ZETIA) 10 MG tablet Take 1 tablet by mouth once daily 90 tablet 1   furosemide (LASIX) 40 MG tablet TAKE 1 TABLET BY MOUTH ONCE DAILY AS NEEDED FOR LEG SWELLING OR SHORTNESS OF BREATH 90 tablet 1   isosorbide mononitrate (IMDUR) 30 MG 24 hr tablet Take 1 tablet (30 mg total) by mouth daily. 90 tablet 2   nitroGLYCERIN (NITROSTAT) 0.4 MG SL tablet Place 1 tablet (0.4 mg total) under the tongue every 5 (five) minutes as needed for chest pain. 25 tablet 3   rosuvastatin (CRESTOR) 40 MG tablet TAKE 1 TABLET BY MOUTH ONCE DAILY AT 6 PM IN THE EVENING 90 tablet 2   No current facility-administered medications for this visit.     Past Surgical History:  Procedure Laterality Date   CORONARY STENT INTERVENTION N/A 07/18/2018  Procedure: CORONARY STENT INTERVENTION;  Surgeon: Martinique, Peter M, MD;  Location: [facility];  Service: Cardiovascular;  Laterality: N/A;   CORONARY STENT INTERVENTION N/A 07/20/2018   Procedure: CORONARY STENT INTERVENTION;  Surgeon: Martinique, Peter M, MD;  Location: [facility];  Service: Cardiovascular;  Laterality: N/A;   CORONARY STENT PLACEMENT  2004, 2007   5 in 2004, 1 in 2007   CORONARY/GRAFT ACUTE MI REVASCULARIZATION N/A 07/18/2018   Procedure: Coronary/Graft Acute MI Revascularization;  Surgeon: Martinique, Peter M, MD;  Location: [facility];  Service: Cardiovascular;  Laterality: N/A;   LEFT HEART CATH AND CORONARY ANGIOGRAPHY N/A 07/18/2018   Procedure: LEFT HEART CATH AND CORONARY ANGIOGRAPHY;  Surgeon: Martinique, Peter M, MD;  Location: [facility];  Service: Cardiovascular;  Laterality: N/A;     Allergies  Allergen Reactions   Brilinta [Ticagrelor] Shortness Of Breath   Ibuprofen Nausea Only       Family History  Problem Relation Age of Onset   Heart attack Mother    Heart attack Father    Coronary artery disease Other    Cancer Daughter      Social History [name] reports that he quit smoking about 46 years ago. His smoking use included cigarettes. He has a 60.00 pack-year smoking history. He has never used smokeless tobacco. [name] reports no history of alcohol use.   Review of Systems CONSTITUTIONAL: No weight loss, fever, chills, weakness or fatigue.  HEENT: Eyes: No visual loss, blurred vision, double vision or yellow sclerae.No hearing loss, sneezing, congestion, runny nose or sore throat.  SKIN: No rash or itching.  CARDIOVASCULAR: per hpi RESPIRATORY: No shortness of breath, cough or sputum.  GASTROINTESTINAL: No anorexia, nausea, vomiting or diarrhea. No abdominal pain or blood.  GENITOURINARY: No burning on urination, no polyuria NEUROLOGICAL: No headache, dizziness, syncope, paralysis, ataxia, numbness or tingling in the extremities. No change in bowel or bladder control.  MUSCULOSKELETAL: No muscle, back pain, joint pain or stiffness.  LYMPHATICS: No enlarged nodes. No history of splenectomy.  PSYCHIATRIC: No history of depression or anxiety.  ENDOCRINOLOGIC: No reports of sweating, cold or heat intolerance. No polyuria or polydipsia.  [name]   Physical Examination Today's Vitals   07/01/22 1120  BP: 120/80  Pulse: 70  SpO2: 96%  Weight: 212 lb 9.6 oz (96.4 kg)  Height: 6' 2.5" (1.892 m)   Body mass index is 26.93 kg/m.  Gen: resting comfortably, no acute distress HEENT: no scleral icterus, pupils equal round and reactive, no palptable cervical adenopathy,  CV: RRR, no m/rg, no jvd Resp: Clear to auscultation bilaterally GI: abdomen is soft, non-tender, non-distended, normal bowel sounds, no hepatosplenomegaly MSK: extremities are warm, no edema.  Skin: warm, no rash Neuro:  no focal deficits Psych: appropriate affect   Diagnostic  Studies  Cath 2007 Central aortic pressure is 101/68 with a mean of 84. LV pressure is 94/1   with an EDP of 6. There is no aortic stenosis.   Left main was normal.   LAD was a large vessel coursing to the apex. There was very prominent   severe ectasia in the proximal portion with aneurysmal dilatation and   swirling flow. However, no high grade stenosis. In the mid LAD, there was   a diffuse 40-50% segmental plaquing. In the distal LAD, there was a tubular   70-80% stenosis. There also were two diagonals. The first diagonal is a   medium vessel with a long 90%  stenosis. The second diagonal was small with   branching vessel with a 70-80% proximal lesion.   The left circumflex was a moderate sized system. It gave off a large   branching OM-1 and a small OM-2. There is a 40% proximal lesion in the   circumflex. The lower Aundria Bitterman of the OM was totally occluded and the distal   part of the vessel filled through collaterals with TIMI I flow. There is a   40% blockage in the upper portion of the OM.   The right coronary artery was a large dominant vessel. It gave off a   moderate sized PDA and two small PLs. There was evidence of previously   placed stent from the ostial portion down to the PDA. There was 40% ostial   stenosis with damping of the 6-French catheter. This did not change with   sublingual nitroglycerin. There is a 30% lesion in the proximal to mid   portion.   Left ventriculogram done the RAO position showed an EF of 70% with mild   mitral valve prolapse. There was no wall motion abnormalities and there was   no mitral regurgitation. Left ventriculogram done in the LAO position   showed an EF of 70% with no regional wall motion abnormalities.   Abdominal aortogram showed patent renal arteries bilaterally with no   abdominal aortic aneurysm.   A left subclavian done for possible bypass surgery showed a patent LIMA to   the chest wall with no obstructions in the subclavian.    ASSESSMENT:   1. Three vessel native coronary artery disease.   2. The LAD has diffuse ectasia and swelling flow proximally as described   above. There is a borderline lesion in the distal LAD and a high grade   disease in the diagonal, but these do not appear to be the culprit   lesion.   3. Totally occluded OM1 which appears to be the culprit.   4. Patent right coronary stents.   5. Normal LV function with mild mitral valve prolapse but no significant   mitral regurgitation.   CONCLUSION: Successful PCI of a totally occluded circumflex marginal vessel   with improvement in narrowing from 100% to 0% and improvement in flow from   TIMI 0 to TIMI III flow using a bare metal stent.     07/2018 echo IMPRESSIONS      1. The left ventricle has low normal systolic function, with an ejection fraction of 50-55%. The cavity size was normal. Left ventricular diastolic Doppler parameters are indeterminate.  2. Mild hypokinesis of the left ventricular, mid-apical inferoseptal wall, inferior segment and inferolateral wall.  3. The right ventricle has normal systolic function. The cavity was normal. There is no increase in right ventricular wall thickness.  4. The aortic valve is tricuspid Mild thickening of the aortic valve Mild calcification of the aortic valve. Aortic valve regurgitation was not assessed by color flow Doppler.     07/2018 cath Previously placed Mid RCA to Dist RCA stent (unknown type) is widely patent. Ost RCA to Mid RCA lesion is 30% stenosed. Mid RCA lesion is 95% stenosed. Ost 1st Mrg to 1st Mrg lesion is 100% stenosed. Ost LAD to Prox LAD lesion is 60% stenosed. Prox LAD lesion is 25% stenosed. Ost 1st Diag to 1st Diag lesion is 95% stenosed. Mid LAD to Dist LAD lesion is 99% stenosed. Post intervention, there is a 0% residual stenosis. A drug-eluting stent was successfully placed using a STENT  SYNERGY DES 3X24. LV end diastolic pressure is moderately elevated.   1.  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 Diffuse 99% stenosis in the mid to distal LAD. 95% bifurcating first diagonal.    - 100% occlusion of the large first OM at site of prior stent. This is the culprit lesion   - 95% mid RCA in stent 2. Moderately elevated LVEDP 3. Successful PCI of the OM 1 with DES x1   Discussed with Dr. Gwenlyn Found. There are no options for treating the LAD. It is diffusely diseased and there is no target for CABG. There are some right to left collaterals to the distal LAD. We proceeded with emergent PCI of the OM1. Recommend DAPT indefinitely given multiple layers of stent. Plan to bring patient back in 2 days for PCI of the RCA. Will treat the LAD medically. Check LV function by Echo. Maximize medical therapy. Stress importance of medication compliance.      07/2018 staged PCI Mid RCA lesion is 95% stenosed. A drug-eluting stent was successfully placed using a STENT SYNERGY DES 3.5X16. Post intervention, there is a 0% residual stenosis.   1. Successful PCI of the mid RCA with DES x 1.   Plan: continue DAPT indefinitely. Anticipate DC home in am.       Assessment and Plan   1. CAD - he is committed to indefinite DAPT as reported above -  Off beta blocker due to bradycardia.  - no recent symptoms, continue current meds   2. Chronic diastolic HF - no symptoms, continue prn lasix.    3. HTN - at goal, continue current meds   4. Hyperlipidemia -above goal despite being on crestor 40 and zetia '10mg'$  daily - refer to lipid clinic     Arnoldo Lenis, M.D.

## 2022-07-01 NOTE — Patient Instructions (Signed)
Medication Instructions:  Continue all current medications.   Labwork: none  Testing/Procedures: none  Follow-Up: 6 months   Any Other Special Instructions Will Be Listed Below (If Applicable). You have been referred to [facility]  If you need a refill on your cardiac medications before your next appointment, please call your pharmacy.

## 2022-07-21 DIAGNOSIS — E785 Hyperlipidemia, unspecified: Secondary | ICD-10-CM | POA: Diagnosis not present

## 2022-07-21 DIAGNOSIS — I130 Hypertensive heart and chronic kidney disease with heart failure and stage 1 through stage 4 chronic kidney disease, or unspecified chronic kidney disease: Secondary | ICD-10-CM | POA: Diagnosis not present

## 2022-07-21 DIAGNOSIS — K219 Gastro-esophageal reflux disease without esophagitis: Secondary | ICD-10-CM | POA: Diagnosis not present

## 2022-07-21 DIAGNOSIS — I700 Atherosclerosis of aorta: Secondary | ICD-10-CM | POA: Diagnosis not present

## 2022-07-21 DIAGNOSIS — I252 Old myocardial infarction: Secondary | ICD-10-CM | POA: Diagnosis not present

## 2022-07-21 DIAGNOSIS — G629 Polyneuropathy, unspecified: Secondary | ICD-10-CM | POA: Diagnosis not present

## 2022-07-21 DIAGNOSIS — R269 Unspecified abnormalities of gait and mobility: Secondary | ICD-10-CM | POA: Diagnosis not present

## 2022-07-21 DIAGNOSIS — I25119 Atherosclerotic heart disease of native coronary artery with unspecified angina pectoris: Secondary | ICD-10-CM | POA: Diagnosis not present

## 2022-07-21 DIAGNOSIS — I509 Heart failure, unspecified: Secondary | ICD-10-CM | POA: Diagnosis not present

## 2022-07-21 DIAGNOSIS — Z809 Family history of malignant neoplasm, unspecified: Secondary | ICD-10-CM | POA: Diagnosis not present

## 2022-07-21 DIAGNOSIS — Z8249 Family history of ischemic heart disease and other diseases of the circulatory system: Secondary | ICD-10-CM | POA: Diagnosis not present

## 2022-07-21 DIAGNOSIS — Z87891 Personal history of nicotine dependence: Secondary | ICD-10-CM | POA: Diagnosis not present

## 2022-08-25 ENCOUNTER — Encounter: Payer: Self-pay | Admitting: Family Medicine

## 2022-08-25 ENCOUNTER — Ambulatory Visit (INDEPENDENT_AMBULATORY_CARE_PROVIDER_SITE_OTHER): Payer: Medicare HMO | Admitting: Family Medicine

## 2022-08-25 VITALS — BP 139/78 | HR 64 | Temp 96.8°F | Ht 74.5 in | Wt 210.4 lb

## 2022-08-25 DIAGNOSIS — E785 Hyperlipidemia, unspecified: Secondary | ICD-10-CM

## 2022-08-25 DIAGNOSIS — I10 Essential (primary) hypertension: Secondary | ICD-10-CM | POA: Diagnosis not present

## 2022-08-25 DIAGNOSIS — I2510 Atherosclerotic heart disease of native coronary artery without angina pectoris: Secondary | ICD-10-CM | POA: Diagnosis not present

## 2022-08-25 DIAGNOSIS — R12 Heartburn: Secondary | ICD-10-CM

## 2022-08-25 MED ORDER — PANTOPRAZOLE SODIUM 40 MG PO TBEC: 40.0000 mg | DELAYED_RELEASE_TABLET | Freq: Every day | ORAL | 11 refills | Status: DC

## 2022-08-25 NOTE — Progress Notes (Signed)
Subjective:  Patient ID: [name], male    DOB: 1941/02/03  Age: 82 y.o. MRN: [id number]  CC: Medical Management of Chronic Issues   HPI [name] presents for follow-up of elevated cholesterol. Doing well without complaints on current medication. Denies side effects of statin including myalgia and arthralgia and nausea. Also in today for liver function testing. Currently no chest pain, shortness of breath or other cardiovascular related symptoms noted.  Bad heartburn, daily. Taking OTC nexium, but also on plavix and ASA.  History [name] has a past medical history of Acute combined systolic and diastolic heart failure (07/19/2018), Bradycardia, CAD in native artery, Foot drop, right foot, History of GI bleed, History of mitral valve prolapse, History of peptic ulcer disease, Hyperlipidemia, mixed, Hypertension, MI (myocardial infarction) (2000, 2004), Neuromuscular disorder, PUD, HX OF (01/03/2009), and STEMI involving left circumflex coronary artery (07/18/2018).   He has a past surgical history that includes Coronary stent placement (2004, 2007); LEFT HEART CATH AND CORONARY ANGIOGRAPHY (N/A, 07/18/2018); Coronary/Graft Acute MI Revascularization (N/A, 07/18/2018); CORONARY STENT INTERVENTION (N/A, 07/18/2018); and CORONARY STENT INTERVENTION (N/A, 07/20/2018).   His family history includes Cancer in his daughter; Coronary artery disease in an other family member; Heart attack in his father and mother.He reports that he quit smoking about 46 years ago. His smoking use included cigarettes. He has a 60.00 pack-year smoking history. He has never used smokeless tobacco. He reports that he does not drink alcohol and does not use drugs.  Current Outpatient Medications on File Prior to Visit  Medication Sig Dispense Refill   aspirin 81 MG EC tablet Take 1 tablet (81 mg total) by mouth every other day. 30 tablet    clopidogrel (PLAVIX) 75 MG tablet Take 1 tablet by mouth once daily 90 tablet 3    ezetimibe (ZETIA) 10 MG tablet Take 1 tablet by mouth once daily 90 tablet 1   furosemide (LASIX) 40 MG tablet TAKE 1 TABLET BY MOUTH ONCE DAILY AS NEEDED FOR LEG SWELLING OR SHORTNESS OF BREATH 90 tablet 1   isosorbide mononitrate (IMDUR) 30 MG 24 hr tablet Take 1 tablet (30 mg total) by mouth daily. 90 tablet 2   nitroGLYCERIN (NITROSTAT) 0.4 MG SL tablet Place 1 tablet (0.4 mg total) under the tongue every 5 (five) minutes as needed for chest pain. 25 tablet 3   rosuvastatin (CRESTOR) 40 MG tablet TAKE 1 TABLET BY MOUTH ONCE DAILY AT 6 PM IN THE EVENING 90 tablet 3   No current facility-administered medications on file prior to visit.    ROS Review of Systems  Constitutional:  Negative for fever.  Respiratory:  Negative for shortness of breath.   Cardiovascular:  Negative for chest pain.  Musculoskeletal:  Negative for arthralgias.  Skin:  Negative for rash.    Objective:  BP 139/78   Pulse 64   Temp (!) 96.8 F (36 C)   Ht 6' 2.5" (1.892 m)   Wt 210 lb 6.4 oz (95.4 kg)   SpO2 95%   BMI 26.65 kg/m   BP Readings from Last 3 Encounters:  08/25/22 139/78  07/01/22 120/80  02/23/22 98/62    Wt Readings from Last 3 Encounters:  08/25/22 210 lb 6.4 oz (95.4 kg)  07/01/22 212 lb 9.6 oz (96.4 kg)  02/23/22 206 lb 6.4 oz (93.6 kg)     Physical Exam Vitals reviewed.  Constitutional:      Appearance: He is well-developed.  HENT:     Head: Normocephalic  and atraumatic.     Right Ear: External ear normal.     Left Ear: External ear normal.     Mouth/Throat:     Pharynx: No oropharyngeal exudate or posterior oropharyngeal erythema.  Eyes:     Pupils: Pupils are equal, round, and reactive to light.  Cardiovascular:     Rate and Rhythm: Normal rate and regular rhythm.     Heart sounds: No murmur heard. Pulmonary:     Effort: No respiratory distress.     Breath sounds: Normal breath sounds.  Musculoskeletal:     Cervical back: Normal range of motion and neck supple.   Neurological:     Mental Status: He is alert and oriented to person, place, and time.     No results found for: "HGBA1C"  Lab Results  Component Value Date   WBC 4.5 02/23/2022   HGB 14.8 02/23/2022   HCT 44.0 02/23/2022   PLT 214 02/23/2022   GLUCOSE 97 02/23/2022   CHOL 153 02/23/2022   TRIG 87 02/23/2022   HDL 50 02/23/2022   LDLDIRECT 142.8 01/28/2008   LDLCALC 87 02/23/2022   ALT 18 02/23/2022   AST 21 02/23/2022   NA 144 02/23/2022   K 4.6 02/23/2022   CL 108 (H) 02/23/2022   CREATININE 1.34 (H) 02/23/2022   BUN 19 02/23/2022   CO2 22 02/23/2022   TSH 3.91 10/04/2017   PSA 1.2 06/13/2014    No results found.  Assessment & Plan:   [name] was seen today for medical management of chronic issues.  Diagnoses and all orders for this visit:  Essential hypertension, benign -     CBC with Differential/Platelet -     CMP14+EGFR  Hyperlipidemia with target LDL less than 70 -     Lipid panel   I am having [name] maintain his nitroGLYCERIN, aspirin EC, furosemide, clopidogrel, isosorbide mononitrate, ezetimibe, and rosuvastatin.  No orders of the defined types were placed in this encounter.    Follow-up: Return in about 6 months (around 02/24/2023).  Mechele Claude, M.D.

## 2022-08-26 LAB — LIPID PANEL
Chol/HDL Ratio: 2.4 ratio (ref 0.0–5.0)
Cholesterol, Total: 137 mg/dL (ref 100–199)
HDL: 56 mg/dL (ref >39)
LDL Chol Calc (NIH): 64 mg/dL (ref 0–99)
Triglycerides: 92 mg/dL (ref 0–149)
VLDL Cholesterol Cal: 17 mg/dL (ref 5–40)

## 2022-08-26 LAB — CBC WITH DIFFERENTIAL/PLATELET
Basophils Absolute: 0 10*3/uL (ref 0.0–0.2)
Basos: 1 %
EOS (ABSOLUTE): 0.3 10*3/uL (ref 0.0–0.4)
Eos: 5 %
Hematocrit: 44.9 % (ref 37.5–51.0)
Hemoglobin: 15.2 g/dL (ref 13.0–17.7)
Immature Grans (Abs): 0 10*3/uL (ref 0.0–0.1)
Immature Granulocytes: 0 %
Lymphocytes Absolute: 1.4 10*3/uL (ref 0.7–3.1)
Lymphs: 27 %
MCH: 29.7 pg (ref 26.6–33.0)
MCHC: 33.9 g/dL (ref 31.5–35.7)
MCV: 88 fL (ref 79–97)
Monocytes Absolute: 0.4 10*3/uL (ref 0.1–0.9)
Monocytes: 8 %
Neutrophils Absolute: 3.2 10*3/uL (ref 1.4–7.0)
Neutrophils: 59 %
Platelets: 205 10*3/uL (ref 150–450)
RBC: 5.11 x10E6/uL (ref 4.14–5.80)
RDW: 12.5 % (ref 11.6–15.4)
WBC: 5.3 10*3/uL (ref 3.4–10.8)

## 2022-08-26 LAB — CMP14+EGFR
ALT: 12 IU/L (ref 0–44)
AST: 17 IU/L (ref 0–40)
Albumin/Globulin Ratio: 1.8 (ref 1.2–2.2)
Albumin: 4.2 g/dL (ref 3.8–4.8)
Alkaline Phosphatase: 121 IU/L (ref 44–121)
BUN/Creatinine Ratio: 11 (ref 10–24)
BUN: 15 mg/dL (ref 8–27)
Bilirubin Total: 1.5 mg/dL — ABNORMAL HIGH (ref 0.0–1.2)
CO2: 24 mmol/L (ref 20–29)
Calcium: 9.6 mg/dL (ref 8.6–10.2)
Chloride: 105 mmol/L (ref 96–106)
Creatinine, Ser: 1.38 mg/dL — ABNORMAL HIGH (ref 0.76–1.27)
Globulin, Total: 2.4 g/dL (ref 1.5–4.5)
Glucose: 113 mg/dL — ABNORMAL HIGH (ref 70–99)
Potassium: 4.2 mmol/L (ref 3.5–5.2)
Sodium: 141 mmol/L (ref 134–144)
Total Protein: 6.6 g/dL (ref 6.0–8.5)
eGFR: 52 mL/min/{1.73_m2} — ABNORMAL LOW (ref >59)

## 2022-08-26 NOTE — Progress Notes (Signed)
Hello [name],  Your lab result is normal and/or stable.Some minor variations that are not significant are commonly marked abnormal, but do not represent any medical problem for you.  Best regards, Hang Ammon, M.D.

## 2022-10-21 ENCOUNTER — Ambulatory Visit (INDEPENDENT_AMBULATORY_CARE_PROVIDER_SITE_OTHER): Payer: Medicare HMO

## 2022-10-21 VITALS — Ht 74.0 in | Wt 212.0 lb

## 2022-10-21 DIAGNOSIS — Z0000 Encounter for general adult medical examination without abnormal findings: Secondary | ICD-10-CM

## 2022-10-21 DIAGNOSIS — Z0100 Encounter for examination of eyes and vision without abnormal findings: Secondary | ICD-10-CM

## 2022-10-21 NOTE — Patient Instructions (Signed)
[name] , Thank you for taking time to come for your Medicare Wellness Visit. I appreciate your ongoing commitment to your health goals. Please review the following plan we discussed and let me know if I can assist you in the future.   These are the goals we discussed:  Goals      DIET - INCREASE WATER INTAKE     Try to drink 6-8 glasses of water daily     Exercise 150 min/wk Moderate Activity     Plan meals     Plan to eat at least 2 meals a day with a few snacks during the day     Prevent falls     Move carefully and slowly. Wear your foot brace.         This is a list of the screening recommended for you and due dates:  Health Maintenance  Topic Date Due   Zoster (Shingles) Vaccine (1 of 2) 11/24/2022*   Flu Shot  12/08/2022   Medicare Annual Wellness Visit  10/21/2023   DTaP/Tdap/Td vaccine (2 - Td or Tdap) 02/23/2030   Pneumonia Vaccine  Completed   HPV Vaccine  Aged Out   COVID-19 Vaccine  Discontinued   Hepatitis C Screening  Discontinued  *Topic was postponed. The date shown is not the original due date.    Advanced directives: Advance directive discussed with you today. I have provided a copy for you to complete at home and have notarized. Once this is complete please bring a copy in to our office so we can scan it into your chart.   Conditions/risks identified: Aim for 30 minutes of exercise or brisk walking, 6-8 glasses of water, and 5 servings of fruits and vegetables each day.   Next appointment: Follow up in one year for your annual wellness visit.   Preventive Care 34 Years and Older, Male  Preventive care refers to lifestyle choices and visits with your health care provider that can promote health and wellness. What does preventive care include? A yearly physical exam. This is also called an annual well check. Dental exams once or twice a year. Routine eye exams. Ask your health care provider how often you should have your eyes checked. Personal  lifestyle choices, including: Daily care of your teeth and gums. Regular physical activity. Eating a healthy diet. Avoiding tobacco and drug use. Limiting alcohol use. Practicing safe sex. Taking low doses of aspirin every day. Taking vitamin and mineral supplements as recommended by your health care provider. What happens during an annual well check? The services and screenings done by your health care provider during your annual well check will depend on your age, overall health, lifestyle risk factors, and family history of disease. Counseling  Your health care provider may ask you questions about your: Alcohol use. Tobacco use. Drug use. Emotional well-being. Home and relationship well-being. Sexual activity. Eating habits. History of falls. Memory and ability to understand (cognition). Work and work Astronomer. Screening  You may have the following tests or measurements: Height, weight, and BMI. Blood pressure. Lipid and cholesterol levels. These may be checked every 5 years, or more frequently if you are over 23 years old. Skin check. Lung cancer screening. You may have this screening every year starting at age 27 if you have a 30-pack-year history of smoking and currently smoke or have quit within the past 15 years. Fecal occult blood test (FOBT) of the stool. You may have this test every year starting at age 62.  Flexible sigmoidoscopy or colonoscopy. You may have a sigmoidoscopy every 5 years or a colonoscopy every 10 years starting at age 53. Prostate cancer screening. Recommendations will vary depending on your family history and other risks. Hepatitis C blood test. Hepatitis B blood test. Sexually transmitted disease (STD) testing. Diabetes screening. This is done by checking your blood sugar (glucose) after you have not eaten for a while (fasting). You may have this done every 1-3 years. Abdominal aortic aneurysm (AAA) screening. You may need this if you are a  current or former smoker. Osteoporosis. You may be screened starting at age 34 if you are at high risk. Talk with your health care provider about your test results, treatment options, and if necessary, the need for more tests. Vaccines  Your health care provider may recommend certain vaccines, such as: Influenza vaccine. This is recommended every year. Tetanus, diphtheria, and acellular pertussis (Tdap, Td) vaccine. You may need a Td booster every 10 years. Zoster vaccine. You may need this after age 6. Pneumococcal 13-valent conjugate (PCV13) vaccine. One dose is recommended after age 77. Pneumococcal polysaccharide (PPSV23) vaccine. One dose is recommended after age 53. Talk to your health care provider about which screenings and vaccines you need and how often you need them. This information is not intended to replace advice given to you by your health care provider. Make sure you discuss any questions you have with your health care provider. Document Released: 05/22/2015 Document Revised: 01/13/2016 Document Reviewed: 02/24/2015 Elsevier Interactive Patient Education  2017 [place] Prevention in the Home Falls can cause injuries. They can happen to people of all ages. There are many things you can do to make your home safe and to help prevent falls. What can I do on the outside of my home? Regularly fix the edges of walkways and driveways and fix any cracks. Remove anything that might make you trip as you walk through a door, such as a raised step or threshold. Trim any bushes or trees on the path to your home. Use bright outdoor lighting. Clear any walking paths of anything that might make someone trip, such as rocks or tools. Regularly check to see if handrails are loose or broken. Make sure that both sides of any steps have handrails. Any raised decks and porches should have guardrails on the edges. Have any leaves, snow, or ice cleared regularly. Use sand or salt on  walking paths during winter. Clean up any spills in your garage right away. This includes oil or grease spills. What can I do in the bathroom? Use night lights. Install grab bars by the toilet and in the tub and shower. Do not use towel bars as grab bars. Use non-skid mats or decals in the tub or shower. If you need to sit down in the shower, use a plastic, non-slip stool. Keep the floor dry. Clean up any water that spills on the floor as soon as it happens. Remove soap buildup in the tub or shower regularly. Attach bath mats securely with double-sided non-slip rug tape. Do not have throw rugs and other things on the floor that can make you trip. What can I do in the bedroom? Use night lights. Make sure that you have a light by your bed that is easy to reach. Do not use any sheets or blankets that are too big for your bed. They should not hang down onto the floor. Have a firm chair that has side arms. You can use this  for support while you get dressed. Do not have throw rugs and other things on the floor that can make you trip. What can I do in the kitchen? Clean up any spills right away. Avoid walking on wet floors. Keep items that you use a lot in easy-to-reach places. If you need to reach something above you, use a strong step stool that has a grab bar. Keep electrical cords out of the way. Do not use floor polish or wax that makes floors slippery. If you must use wax, use non-skid floor wax. Do not have throw rugs and other things on the floor that can make you trip. What can I do with my stairs? Do not leave any items on the stairs. Make sure that there are handrails on both sides of the stairs and use them. Fix handrails that are broken or loose. Make sure that handrails are as long as the stairways. Check any carpeting to make sure that it is firmly attached to the stairs. Fix any carpet that is loose or worn. Avoid having throw rugs at the top or bottom of the stairs. If you do  have throw rugs, attach them to the floor with carpet tape. Make sure that you have a light switch at the top of the stairs and the bottom of the stairs. If you do not have them, ask someone to add them for you. What else can I do to help prevent falls? Wear shoes that: Do not have high heels. Have rubber bottoms. Are comfortable and fit you well. Are closed at the toe. Do not wear sandals. If you use a stepladder: Make sure that it is fully opened. Do not climb a closed stepladder. Make sure that both sides of the stepladder are locked into place. Ask someone to hold it for you, if possible. Clearly mark and make sure that you can see: Any grab bars or handrails. First and last steps. Where the edge of each step is. Use tools that help you move around (mobility aids) if they are needed. These include: Canes. Walkers. Scooters. Crutches. Turn on the lights when you go into a dark area. Replace any light bulbs as soon as they burn out. Set up your furniture so you have a clear path. Avoid moving your furniture around. If any of your floors are uneven, fix them. If there are any pets around you, be aware of where they are. Review your medicines with your doctor. Some medicines can make you feel dizzy. This can increase your chance of falling. Ask your doctor what other things that you can do to help prevent falls. This information is not intended to replace advice given to you by your health care provider. Make sure you discuss any questions you have with your health care provider. Document Released: 02/19/2009 Document Revised: 10/01/2015 Document Reviewed: 05/30/2014 Elsevier Interactive Patient Education  2017 [employer].

## 2022-10-21 NOTE — Progress Notes (Signed)
Subjective:   [name] is a 82 y.o. male who presents for Medicare Annual/Subsequent preventive examination. I connected with  Christean Grief on 10/21/22 by a audio enabled telemedicine application and verified that I am speaking with the correct person using two identifiers.  Patient Location: Home  Provider Location: Home Office  I discussed the limitations of evaluation and management by telemedicine. The patient expressed understanding and agreed to proceed.  Review of Systems     Cardiac Risk Factors include: advanced age (>64men, >52 women);male gender;dyslipidemia;hypertension     Objective:    Today's Vitals   10/21/22 0917  Weight: 212 lb (96.2 kg)  Height: 6\' 2"  (1.88 m)   Body mass index is 27.22 kg/m.     10/21/2022    9:22 AM 10/06/2021    2:08 PM 08/25/2020    1:33 PM 02/14/2020    2:12 PM 07/24/2019   12:00 PM 09/03/2018    1:02 PM 07/23/2018    8:13 PM  Advanced Directives  Does Patient Have a Medical Advance Directive? No No No No No No No  Would patient like information on creating a medical advance directive? No - Patient declined No - Patient declined No - Patient declined No - Patient declined No - Patient declined No - Patient declined No - Patient declined    Current Medications (verified) Outpatient Encounter Medications as of 10/21/2022  Medication Sig   aspirin 81 MG EC tablet Take 1 tablet (81 mg total) by mouth every other day.   clopidogrel (PLAVIX) 75 MG tablet Take 1 tablet by mouth once daily   ezetimibe (ZETIA) 10 MG tablet Take 1 tablet by mouth once daily   furosemide (LASIX) 40 MG tablet TAKE 1 TABLET BY MOUTH ONCE DAILY AS NEEDED FOR LEG SWELLING OR SHORTNESS OF BREATH   isosorbide mononitrate (IMDUR) 30 MG 24 hr tablet Take 1 tablet (30 mg total) by mouth daily.   nitroGLYCERIN (NITROSTAT) 0.4 MG SL tablet Place 1 tablet (0.4 mg total) under the tongue every 5 (five) minutes as needed for chest pain.   pantoprazole (PROTONIX) 40 MG  tablet Take 1 tablet (40 mg total) by mouth daily. For stomach   rosuvastatin (CRESTOR) 40 MG tablet TAKE 1 TABLET BY MOUTH ONCE DAILY AT 6 PM IN THE EVENING   No facility-administered encounter medications on file as of 10/21/2022.    Allergies (verified) Brilinta [ticagrelor] and Ibuprofen   History: Past Medical History:  Diagnosis Date   Acute combined systolic and diastolic heart failure (HCC) 07/19/2018   Bradycardia    CAD in native artery    Foot drop, right foot    History of GI bleed    History of mitral valve prolapse    History of peptic ulcer disease    Hyperlipidemia, mixed    Hypertension    MI (myocardial infarction) (HCC) 2000, 2004   Neuromuscular disorder (HCC)    PUD, HX OF 01/03/2009   Qualifier: Diagnosis of  By: Denyse Amass CMA, Carol     STEMI involving left circumflex coronary artery (HCC) 07/18/2018   Past Surgical History:  Procedure Laterality Date   CORONARY STENT INTERVENTION N/A 07/18/2018   Procedure: CORONARY STENT INTERVENTION;  Surgeon: Swaziland, Peter M, MD;  Location: [facility] INVASIVE CV LAB;  Service: Cardiovascular;  Laterality: N/A;   CORONARY STENT INTERVENTION N/A 07/20/2018   Procedure: CORONARY STENT INTERVENTION;  Surgeon: Swaziland, Peter M, MD;  Location: [facility] INVASIVE CV LAB;  Service: Cardiovascular;  Laterality: N/A;  CORONARY STENT PLACEMENT  2004, 2007   5 in 2004, 1 in 2007   CORONARY/GRAFT ACUTE MI REVASCULARIZATION N/A 07/18/2018   Procedure: Coronary/Graft Acute MI Revascularization;  Surgeon: Swaziland, Peter M, MD;  Location: [facility] INVASIVE CV LAB;  Service: Cardiovascular;  Laterality: N/A;   LEFT HEART CATH AND CORONARY ANGIOGRAPHY N/A 07/18/2018   Procedure: LEFT HEART CATH AND CORONARY ANGIOGRAPHY;  Surgeon: Swaziland, Peter M, MD;  Location: [facility] INVASIVE CV LAB;  Service: Cardiovascular;  Laterality: N/A;   Family History  Problem Relation Age of Onset   Heart attack Mother    Heart attack Father    Coronary artery disease Other    Cancer  Daughter    Social History   Socioeconomic History   Marital status: Married    Spouse name: Mary   Number of children: 2   Years of education: 9   Highest education level: GED or equivalent  Occupational History   Occupation: Retired in 2006  Tobacco Use   Smoking status: Former    Packs/day: 3.00    Years: 20.00    Additional pack years: 0.00    Total pack years: 60.00    Types: Cigarettes    Quit date: 05/09/1976    Years since quitting: 46.4   Smokeless tobacco: Never  Vaping Use   Vaping Use: Never used  Substance and Sexual Activity   Alcohol use: No   Drug use: No   Sexual activity: Not Currently    Partners: Female  Other Topics Concern   Not on file  Social History Narrative   Married and lives at home with his wife.   He has two children.    He is retired but stays active at home.   Right handed   Social Determinants of Health   Financial Resource Strain: Low Risk  (10/21/2022)   Overall Financial Resource Strain (CARDIA)    Difficulty of Paying Living Expenses: Not hard at all  Food Insecurity: No Food Insecurity (10/21/2022)   Hunger Vital Sign    Worried About Running Out of Food in the Last Year: Never true    Ran Out of Food in the Last Year: Never true  Transportation Needs: No Transportation Needs (10/21/2022)   PRAPARE - Administrator, Civil Service (Medical): No    Lack of Transportation (Non-Medical): No  Physical Activity: Insufficiently Active (10/21/2022)   Exercise Vital Sign    Days of Exercise per Week: 3 days    Minutes of Exercise per Session: 30 min  Stress: No Stress Concern Present (10/21/2022)   [employer] of Occupational Health - Occupational Stress Questionnaire    Feeling of Stress : Not at all  Social Connections: Moderately Integrated (10/21/2022)   Social Connection and Isolation Panel [NHANES]    Frequency of Communication with Friends and Family: More than three times a week    Frequency of Social  Gatherings with Friends and Family: More than three times a week    Attends Religious Services: More than 4 times per year    Active Member of [employer] or Organizations: No    Attends Engineer, structural: Never    Marital Status: Married    Tobacco Counseling Counseling given: Not Answered   Clinical Intake:  Pre-visit preparation completed: Yes  Pain : No/denies pain     Nutritional Risks: None Diabetes: No  How often do you need to have someone help you when you read instructions, pamphlets, or other written materials from  your doctor or pharmacy?: 1 - Never  Diabetic?no   Interpreter Needed?: No  Information entered by :: Renie Ora, LPN   Activities of Daily Living    10/21/2022    9:22 AM  In your present state of health, do you have any difficulty performing the following activities:  Hearing? 0  Vision? 0  Difficulty concentrating or making decisions? 0  Walking or climbing stairs? 0  Dressing or bathing? 0  Doing errands, shopping? 0  Preparing Food and eating ? N  Using the Toilet? N  In the past six months, have you accidently leaked urine? N  Do you have problems with loss of bowel control? N  Managing your Medications? N  Managing your Finances? N  Housekeeping or managing your Housekeeping? N    Patient Care Team: Mechele Claude, MD as PCP - General (Family Medicine) Wyline Mood Dorothe Pea, MD as PCP - Cardiology (Cardiology) Van Clines, MD as Consulting Physician (Neurology)  Indicate any recent Medical Services you may have received from other than [facility] providers in the past year (date may be approximate).     Assessment:   This is a routine wellness examination for [place].  Hearing/Vision screen Vision Screening - Comments:: Referral 10/20/2022  Dietary issues and exercise activities discussed: Current Exercise Habits: Home exercise routine, Type of exercise: walking, Time (Minutes): 30, Frequency (Times/Week): 3, Weekly  Exercise (Minutes/Week): 90, Intensity: Mild, Exercise limited by: orthopedic condition(s)   Goals Addressed             This Visit's Progress    Plan meals   On track    Plan to eat at least 2 meals a day with a few snacks during the day       Depression Screen    10/21/2022    9:21 AM 08/25/2022    8:29 AM 02/23/2022    9:11 AM 02/23/2022    9:10 AM 02/23/2022    9:01 AM 02/03/2022    8:15 AM 10/06/2021    2:05 PM  PHQ 2/9 Scores  PHQ - 2 Score 0 0 0 0 0 0 0  PHQ- 9 Score      5     Fall Risk    10/21/2022    9:19 AM 08/25/2022    8:29 AM 02/23/2022    9:01 AM 02/03/2022    8:15 AM 10/06/2021    2:08 PM  Fall Risk   Falls in the past year? 0 0 1 1 1   Number falls in past yr: 0  1 1 1   Injury with Fall? 0  0 0 0  Risk for fall due to : No Fall Risks  Impaired balance/gait Impaired balance/gait History of fall(s);Impaired balance/gait;Impaired mobility  Follow up Falls prevention discussed  Falls evaluation completed Falls evaluation completed Falls prevention discussed    FALL RISK PREVENTION PERTAINING TO THE HOME:  Any stairs in or around the home? No  If so, are there any without handrails? No  Home free of loose throw rugs in walkways, pet beds, electrical cords, etc? Yes  Adequate lighting in your home to reduce risk of falls? Yes   ASSISTIVE DEVICES UTILIZED TO PREVENT FALLS:  Life alert? No  Use of a cane, walker or w/c? No  Grab bars in the bathroom? No  Shower chair or bench in shower? No  Elevated toilet seat or a handicapped toilet? No       07/18/2017    9:25 AM 02/24/2016  9:12 AM  MMSE - Mini Mental State Exam  Orientation to time 5 5  Orientation to Place 5 5  Registration 3 3  Attention/ Calculation 5 5  Recall 2 3  Language- name 2 objects 2 2  Language- repeat 1 1  Language- follow 3 step command 3 3  Language- read & follow direction 1 1  Write a sentence 1 1  Copy design 0 1  Total score 28 30      12/07/2018    3:00 PM  05/11/2018   10:00 AM 10/04/2017    9:00 AM  Montreal Cognitive Assessment   Visuospatial/ Executive (0/5)  5 4  Naming (0/3)  3 3  Attention: Read list of digits (0/2) 2 2 2   Attention: Read list of letters (0/1) 1 1 1   Attention: Serial 7 subtraction starting at 100 (0/3) 3 1 3   Language: Repeat phrase (0/2) 2 2 2   Language : Fluency (0/1) 0 0 0  Abstraction (0/2) 2 2 2   Delayed Recall (0/5) 3 5 5   Orientation (0/6) 4 6 6   Total  27 28      10/21/2022    9:22 AM 10/06/2021    2:13 PM 08/25/2020    1:33 PM 07/24/2019   12:05 PM  6CIT Screen  What Year? 0 points  0 points 0 points  What month? 0 points  0 points 0 points  What time? 0 points 0 points 0 points 0 points  Count back from 20 0 points 0 points 0 points 0 points  Months in reverse 0 points 2 points 0 points 0 points  Repeat phrase 0 points 0 points 2 points 0 points  Total Score 0 points  2 points 0 points    Immunizations Immunization History  Administered Date(s) Administered   Influenza, High Dose Seasonal PF 02/09/2017, 01/24/2019   Influenza-Unspecified 01/23/2020   Pneumococcal Conjugate-13 07/23/2014   Pneumococcal Polysaccharide-23 07/30/2011   Tdap 02/24/2020    TDAP status: Up to date  Flu Vaccine status: Declined, Education has been provided regarding the importance of this vaccine but patient still declined. Advised may receive this vaccine at local pharmacy or Health Dept. Aware to provide a copy of the vaccination record if obtained from local pharmacy or Health Dept. Verbalized acceptance and understanding.  Pneumococcal vaccine status: Up to date  Covid-19 vaccine status: Completed vaccines  Qualifies for Shingles Vaccine? Yes   Zostavax completed No   Shingrix Completed?: No.    Education has been provided regarding the importance of this vaccine. Patient has been advised to call insurance company to determine out of pocket expense if they have not yet received this vaccine. Advised may also  receive vaccine at local pharmacy or Health Dept. Verbalized acceptance and understanding.  Screening Tests Health Maintenance  Topic Date Due   Zoster Vaccines- Shingrix (1 of 2) 11/24/2022 (Originally [birth year]1991)   INFLUENZA VACCINE  12/08/2022   Medicare Annual Wellness (AWV)  10/21/2023   DTaP/Tdap/Td (2 - Td or Tdap) 02/23/2030   Pneumonia Vaccine 98+ Years old  Completed   HPV VACCINES  Aged Out   COVID-19 Vaccine  Discontinued   Hepatitis C Screening  Discontinued    Health Maintenance  There are no preventive care reminders to display for this patient.   Colorectal cancer screening: No longer required.   Lung Cancer Screening: (Low Dose CT Chest recommended if Age 61-80 years, 30 pack-year currently smoking OR have quit w/in 15years.) does not qualify.  Lung Cancer Screening Referral: n/a  Additional Screening:  Hepatitis C Screening: does not qualify;   Vision Screening: Recommended annual ophthalmology exams for early detection of glaucoma and other disorders of the eye. Is the patient up to date with their annual eye exam?  No  Who is the provider or what is the name of the office in which the patient attends annual eye exams? Referral 10/20/2022 If pt is not established with a provider, would they like to be referred to a provider to establish care? No .   Dental Screening: Recommended annual dental exams for proper oral hygiene  Community Resource Referral / Chronic Care Management: CRR required this visit?  No   CCM required this visit?  No      Plan:     I have personally reviewed and noted the following in the patient's chart:   Medical and social history Use of alcohol, tobacco or illicit drugs  Current medications and supplements including opioid prescriptions. Patient is not currently taking opioid prescriptions. Functional ability and status Nutritional status Physical activity Advanced directives List of other physicians Hospitalizations,  surgeries, and ER visits in previous 12 months Vitals Screenings to include cognitive, depression, and falls Referrals and appointments  In addition, I have reviewed and discussed with patient certain preventive protocols, quality metrics, and best practice recommendations. A written personalized care plan for preventive services as well as general preventive health recommendations were provided to patient.     Lorrene Reid, LPN   1/61/0960   Nurse Notes: none

## 2022-11-21 ENCOUNTER — Other Ambulatory Visit: Payer: Self-pay | Admitting: Cardiology

## 2022-11-27 ENCOUNTER — Other Ambulatory Visit: Payer: Self-pay | Admitting: Cardiology

## 2022-12-27 ENCOUNTER — Ambulatory Visit: Payer: Medicare HMO | Admitting: Cardiology

## 2022-12-30 ENCOUNTER — Ambulatory Visit: Payer: Medicare HMO | Attending: Cardiology | Admitting: Cardiology

## 2022-12-30 ENCOUNTER — Encounter: Payer: Self-pay | Admitting: Cardiology

## 2022-12-30 VITALS — BP 128/72 | HR 53 | Ht 74.5 in | Wt 207.2 lb

## 2022-12-30 DIAGNOSIS — I5032 Chronic diastolic (congestive) heart failure: Secondary | ICD-10-CM

## 2022-12-30 DIAGNOSIS — E782 Mixed hyperlipidemia: Secondary | ICD-10-CM | POA: Diagnosis not present

## 2022-12-30 DIAGNOSIS — I2510 Atherosclerotic heart disease of native coronary artery without angina pectoris: Secondary | ICD-10-CM

## 2022-12-30 DIAGNOSIS — I10 Essential (primary) hypertension: Secondary | ICD-10-CM

## 2022-12-30 MED ORDER — ISOSORBIDE MONONITRATE ER 30 MG PO TB24: 30.0000 mg | ORAL_TABLET | Freq: Every day | ORAL | 2 refills | Status: DC

## 2022-12-30 NOTE — Progress Notes (Addendum)
Clinical Summary Mr. Helderman is a 82 y.o.male seen today for follow up of the following medical problems.    1. CAD - multiple PCIs in the past - hx of MI in 2004, with stenting to RCA (ostium down to PDA). Of note had GI bleed 2 months later on DAPT and NSAIDs requiring transfusion.   - cath from 2007 with occluded LCX OM, received a BMS. RCA stents were patent at that time. LVEF 70% by LV gram at that time, no echoes in our system     - admit 07/2018 with acute posterior STEMI, taken to cath lab - 07/2018 cath showed ostal LAD 60%, mid LAD 99%, D1 95%, OM1 occluded, RCA 30%, mid RCA 95%. Received DES to OM1, then staged PCI to RCA. From notes no options on treating LAD, diffusely diseased and no target for CABG. Recs for indefinitie DAPT. LVEDP 29, limited diuresis due to uptrend in Cr.     -no chest pains, no SOB/DOE - compliant with meds     2. Chronic diastolic HF - no SOB/DOE, no LE edema.  - has prn lasix, takes just about once a month.    3. HTN - compliant with meds   4. Hyperlipidemia   - 02/2020 TC 142 TG 87 HDL 51 LDL 74 - 08/2020 TC 664 TG 403 HL 45 LDL 72 - he is on crestor 40, zetia 10   02/2021 TC 160 TG 107 HDL 52 LDL 89 -02/2022 TC 474 TG 87 HDL 50 LDL 87 08/2022 TC 137 TG 92 HDL 56 LDL 64   5. Chronic bradycardia - occasional lightheadedness with standing fast.  Past Medical History:  Diagnosis Date   Acute combined systolic and diastolic heart failure (HCC) 07/19/2018   Bradycardia    CAD in native artery    Foot drop, right foot    History of GI bleed    History of mitral valve prolapse    History of peptic ulcer disease    Hyperlipidemia, mixed    Hypertension    MI (myocardial infarction) (HCC) 2000, 2004   Neuromuscular disorder (HCC)    PUD, HX OF 01/03/2009   Qualifier: Diagnosis of  By: Denyse Amass CMA, Carol     STEMI involving left circumflex coronary artery (HCC) 07/18/2018     Allergies  Allergen Reactions   Brilinta [Ticagrelor]  Shortness Of Breath   Ibuprofen Nausea Only     Current Outpatient Medications  Medication Sig Dispense Refill   aspirin 81 MG EC tablet Take 1 tablet (81 mg total) by mouth every other day. 30 tablet    clopidogrel (PLAVIX) 75 MG tablet Take 1 tablet by mouth once daily 90 tablet 0   ezetimibe (ZETIA) 10 MG tablet Take 1 tablet by mouth once daily 90 tablet 0   furosemide (LASIX) 40 MG tablet TAKE 1 TABLET BY MOUTH ONCE DAILY AS NEEDED FOR LEG SWELLING OR SHORTNESS OF BREATH 90 tablet 1   isosorbide mononitrate (IMDUR) 30 MG 24 hr tablet Take 1 tablet (30 mg total) by mouth daily. 90 tablet 2   nitroGLYCERIN (NITROSTAT) 0.4 MG SL tablet Place 1 tablet (0.4 mg total) under the tongue every 5 (five) minutes as needed for chest pain. 25 tablet 3   pantoprazole (PROTONIX) 40 MG tablet Take 1 tablet (40 mg total) by mouth daily. For stomach 30 tablet 11   rosuvastatin (CRESTOR) 40 MG tablet TAKE 1 TABLET BY MOUTH ONCE DAILY AT 6 PM IN THE  EVENING 90 tablet 3   No current facility-administered medications for this visit.     Past Surgical History:  Procedure Laterality Date   CORONARY STENT INTERVENTION N/A 07/18/2018   Procedure: CORONARY STENT INTERVENTION;  Surgeon: Swaziland, Peter M, MD;  Location: [facility] INVASIVE CV LAB;  Service: Cardiovascular;  Laterality: N/A;   CORONARY STENT INTERVENTION N/A 07/20/2018   Procedure: CORONARY STENT INTERVENTION;  Surgeon: Swaziland, Peter M, MD;  Location: [facility] INVASIVE CV LAB;  Service: Cardiovascular;  Laterality: N/A;   CORONARY STENT PLACEMENT  2004, 2007   5 in 2004, 1 in 2007   CORONARY/GRAFT ACUTE MI REVASCULARIZATION N/A 07/18/2018   Procedure: Coronary/Graft Acute MI Revascularization;  Surgeon: Swaziland, Peter M, MD;  Location: [facility] INVASIVE CV LAB;  Service: Cardiovascular;  Laterality: N/A;   LEFT HEART CATH AND CORONARY ANGIOGRAPHY N/A 07/18/2018   Procedure: LEFT HEART CATH AND CORONARY ANGIOGRAPHY;  Surgeon: Swaziland, Peter M, MD;  Location: [facility] INVASIVE  CV LAB;  Service: Cardiovascular;  Laterality: N/A;     Allergies  Allergen Reactions   Brilinta [Ticagrelor] Shortness Of Breath   Ibuprofen Nausea Only      Family History  Problem Relation Age of Onset   Heart attack Mother    Heart attack Father    Coronary artery disease Other    Cancer Daughter      Social History Mr. Hussein reports that he quit smoking about 46 years ago. His smoking use included cigarettes. He started smoking about 66 years ago. He has a 60 pack-year smoking history. He has never used smokeless tobacco. Mr. Eunice reports no history of alcohol use.   Review of Systems CONSTITUTIONAL: No weight loss, fever, chills, weakness or fatigue.  HEENT: Eyes: No visual loss, blurred vision, double vision or yellow sclerae.No hearing loss, sneezing, congestion, runny nose or sore throat.  SKIN: No rash or itching.  CARDIOVASCULAR: per hpi RESPIRATORY: No shortness of breath, cough or sputum.  GASTROINTESTINAL: No anorexia, nausea, vomiting or diarrhea. No abdominal pain or blood.  GENITOURINARY: No burning on urination, no polyuria NEUROLOGICAL: No headache, dizziness, syncope, paralysis, ataxia, numbness or tingling in the extremities. No change in bowel or bladder control.  MUSCULOSKELETAL: No muscle, back pain, joint pain or stiffness.  LYMPHATICS: No enlarged nodes. No history of splenectomy.  PSYCHIATRIC: No history of depression or anxiety.  ENDOCRINOLOGIC: No reports of sweating, cold or heat intolerance. No polyuria or polydipsia.  [name]   Physical Examination Today's Vitals   12/30/22 1408  BP: 128/72  Pulse: (!) 53  SpO2: 98%  Weight: 207 lb 3.2 oz (94 kg)  Height: 6' 2.5" (1.892 m)   Body mass index is 26.25 kg/m.  Gen: resting comfortably, no acute distress HEENT: no scleral icterus, pupils equal round and reactive, no palptable cervical adenopathy,  CV: RRR, no m/rg, no jvd Resp: Clear to auscultation bilaterally GI: abdomen is soft,  non-tender, non-distended, normal bowel sounds, no hepatosplenomegaly MSK: extremities are warm, no edema.  Skin: warm, no rash Neuro:  no focal deficits Psych: appropriate affect   Diagnostic Studies  Cath 2007 Central aortic pressure is 101/68 with a mean of 84. LV pressure is 94/1   with an EDP of 6. There is no aortic stenosis.   Left main was normal.   LAD was a large vessel coursing to the apex. There was very prominent   severe ectasia in the proximal portion with aneurysmal dilatation and   swirling flow. However, no high grade stenosis. In the  mid LAD, there was   a diffuse 40-50% segmental plaquing. In the distal LAD, there was a tubular   70-80% stenosis. There also were two diagonals. The first diagonal is a   medium vessel with a long 90% stenosis. The second diagonal was small with   branching vessel with a 70-80% proximal lesion.   The left circumflex was a moderate sized system. It gave off a large   branching OM-1 and a small OM-2. There is a 40% proximal lesion in the   circumflex. The lower Chelbie Jarnagin of the OM was totally occluded and the distal   part of the vessel filled through collaterals with TIMI I flow. There is a   40% blockage in the upper portion of the OM.   The right coronary artery was a large dominant vessel. It gave off a   moderate sized PDA and two small PLs. There was evidence of previously   placed stent from the ostial portion down to the PDA. There was 40% ostial   stenosis with damping of the 6-French catheter. This did not change with   sublingual nitroglycerin. There is a 30% lesion in the proximal to mid   portion.   Left ventriculogram done the RAO position showed an EF of 70% with mild   mitral valve prolapse. There was no wall motion abnormalities and there was   no mitral regurgitation. Left ventriculogram done in the LAO position   showed an EF of 70% with no regional wall motion abnormalities.   Abdominal aortogram showed patent renal  arteries bilaterally with no   abdominal aortic aneurysm.   A left subclavian done for possible bypass surgery showed a patent LIMA to   the chest wall with no obstructions in the subclavian.   ASSESSMENT:   1. Three vessel native coronary artery disease.   2. The LAD has diffuse ectasia and swelling flow proximally as described   above. There is a borderline lesion in the distal LAD and a high grade   disease in the diagonal, but these do not appear to be the culprit   lesion.   3. Totally occluded OM1 which appears to be the culprit.   4. Patent right coronary stents.   5. Normal LV function with mild mitral valve prolapse but no significant   mitral regurgitation.   CONCLUSION: Successful PCI of a totally occluded circumflex marginal vessel   with improvement in narrowing from 100% to 0% and improvement in flow from   TIMI 0 to TIMI III flow using a bare metal stent.  07/2018 echo IMPRESSIONS      1. The left ventricle has low normal systolic function, with an ejection fraction of 50-55%. The cavity size was normal. Left ventricular diastolic Doppler parameters are indeterminate.  2. Mild hypokinesis of the left ventricular, mid-apical inferoseptal wall, inferior segment and inferolateral wall.  3. The right ventricle has normal systolic function. The cavity was normal. There is no increase in right ventricular wall thickness.  4. The aortic valve is tricuspid Mild thickening of the aortic valve Mild calcification of the aortic valve. Aortic valve regurgitation was not assessed by color flow Doppler.     07/2018 cath Previously placed Mid RCA to Dist RCA stent (unknown type) is widely patent. Ost RCA to Mid RCA lesion is 30% stenosed. Mid RCA lesion is 95% stenosed. Ost 1st Mrg to 1st Mrg lesion is 100% stenosed. Ost LAD to Prox LAD lesion is 60% stenosed. Prox LAD lesion is  25% stenosed. Ost 1st Diag to 1st Diag lesion is 95% stenosed. Mid LAD to Dist LAD lesion is 99%  stenosed. Post intervention, there is a 0% residual stenosis. A drug-eluting stent was successfully placed using a STENT SYNERGY DES 3X24. LV end diastolic pressure is moderately elevated.   1. 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 Diffuse 99% stenosis in the mid to distal LAD. 95% bifurcating first diagonal.    - 100% occlusion of the large first OM at site of prior stent. This is the culprit lesion   - 95% mid RCA in stent 2. Moderately elevated LVEDP 3. Successful PCI of the OM 1 with DES x1   Discussed with Dr. Allyson Sabal. There are no options for treating the LAD. It is diffusely diseased and there is no target for CABG. There are some right to left collaterals to the distal LAD. We proceeded with emergent PCI of the OM1. Recommend DAPT indefinitely given multiple layers of stent. Plan to bring patient back in 2 days for PCI of the RCA. Will treat the LAD medically. Check LV function by Echo. Maximize medical therapy. Stress importance of medication compliance.      07/2018 staged PCI Mid RCA lesion is 95% stenosed. A drug-eluting stent was successfully placed using a STENT SYNERGY DES 3.5X16. Post intervention, there is a 0% residual stenosis.   1. Successful PCI of the mid RCA with DES x 1.   Plan: continue DAPT indefinitely. Anticipate DC home in am.      Assessment and Plan   1. CAD - he is committed to indefinite DAPT as reported above -  Off beta blocker due to bradycardia.  - denies recent symptoms, continue current meds - EKG today shows sinus brady, no acute ischemic changes   2. Chronic diastolic HF -euvolemic, takes lasix at most once a month - very mild diastolic HF, limited diuretic use. Have not started SGLT2i   3. HTN - at goal, continue current meds   4. Hyperlipidemia - reasonable control, he is on max oral regimen with crestor 40mg  daily and zetia 10mg  - monitor at this time, continue current meds, If  significant uptrend in LDL would need to consider [id number]     Antoine Poche, M.D.

## 2022-12-30 NOTE — Patient Instructions (Signed)
Medication Instructions:  Your physician recommends that you continue on your current medications as directed. Please refer to the Current Medication list given to you today.  *If you need a refill on your cardiac medications before your next appointment, please call your pharmacy*   Lab Work: None If you have labs (blood work) drawn today and your tests are completely normal, you will receive your results only by: MyChart Message (if you have MyChart) OR A paper copy in the mail If you have any lab test that is abnormal or we need to change your treatment, we will call you to review the results.   Testing/Procedures: None   Follow-Up: At [facility], you and your health needs are our priority.  As part of our continuing mission to provide you with exceptional heart care, we have created designated Provider Care Teams.  These Care Teams include your primary Cardiologist (physician) and Advanced Practice Providers (APPs -  Physician Assistants and Nurse Practitioners) who all work together to provide you with the care you need, when you need it.  We recommend signing up for the patient portal called "MyChart".  Sign up information is provided on this After Visit Summary.  MyChart is used to connect with patients for Virtual Visits (Telemedicine).  Patients are able to view lab/test results, encounter notes, upcoming appointments, etc.  Non-urgent messages can be sent to your provider as well.   To learn more about what you can do with MyChart, go to [email or web address].    Your next appointment:   6 month(s)  Provider:   Jonathan Branch, MD    Other Instructions    

## 2023-02-22 ENCOUNTER — Other Ambulatory Visit: Payer: Self-pay | Admitting: Cardiology

## 2023-02-27 ENCOUNTER — Ambulatory Visit: Payer: Medicare HMO | Admitting: Family Medicine

## 2023-02-28 ENCOUNTER — Encounter: Payer: Self-pay | Admitting: Family Medicine

## 2023-04-03 ENCOUNTER — Ambulatory Visit (INDEPENDENT_AMBULATORY_CARE_PROVIDER_SITE_OTHER): Payer: Medicare HMO | Admitting: Family Medicine

## 2023-04-03 ENCOUNTER — Encounter: Payer: Self-pay | Admitting: Family Medicine

## 2023-04-03 VITALS — BP 129/70 | HR 49 | Temp 97.1°F | Ht 74.5 in | Wt 211.8 lb

## 2023-04-03 DIAGNOSIS — E785 Hyperlipidemia, unspecified: Secondary | ICD-10-CM | POA: Diagnosis not present

## 2023-04-03 DIAGNOSIS — I10 Essential (primary) hypertension: Secondary | ICD-10-CM | POA: Diagnosis not present

## 2023-04-03 NOTE — Progress Notes (Signed)
Subjective:  Patient ID: [name], male    DOB: 1940/07/07  Age: 82 y.o. MRN: [id number]  CC: Medical Management of Chronic Issues   HPI AZAZEL EZERNACK presents for follow-up of elevated cholesterol. Doing well without complaints on current medication. Denies side effects of statin including myalgia and arthralgia and nausea. Also in today for liver function testing. Currently no chest pain, shortness of breath or other cardiovascular related symptoms noted.   presents for  follow-up of Patient in for follow-up of GERD. Currently asymptomatic taking  PPI daily. There is no chest pain or heartburn. No hematemesis and no melena. No dysphagia or choking. Onset is remote. Progression is stable. Complicating factors, none.  History Perryn has a past medical history of Acute combined systolic and diastolic heart failure (HCC) (08/15/8117), Bradycardia, CAD in native artery, Foot drop, right foot, History of GI bleed, History of mitral valve prolapse, History of peptic ulcer disease, Hyperlipidemia, mixed, Hypertension, MI (myocardial infarction) (HCC) (2000, 2004), Neuromuscular disorder (HCC), PUD, HX OF (01/03/2009), and STEMI involving left circumflex coronary artery (HCC) (07/18/2018).   He has a past surgical history that includes Coronary stent placement (2004, 2007); LEFT HEART CATH AND CORONARY ANGIOGRAPHY (N/A, 07/18/2018); Coronary/Graft Acute MI Revascularization (N/A, 07/18/2018); CORONARY STENT INTERVENTION (N/A, 07/18/2018); and CORONARY STENT INTERVENTION (N/A, 07/20/2018).   His family history includes Cancer in his daughter; Coronary artery disease in an other family member; Heart attack in his father and mother.He reports that he quit smoking about 46 years ago. His smoking use included cigarettes. He started smoking about 66 years ago. He has a 60 pack-year smoking history. He has never used smokeless tobacco. He reports that he does not drink alcohol and does not use drugs.  Current  Outpatient Medications on File Prior to Visit  Medication Sig Dispense Refill   aspirin 81 MG EC tablet Take 1 tablet (81 mg total) by mouth every other day. 30 tablet    clopidogrel (PLAVIX) 75 MG tablet Take 1 tablet by mouth once daily 90 tablet 0   ezetimibe (ZETIA) 10 MG tablet Take 1 tablet by mouth once daily 90 tablet 0   isosorbide mononitrate (IMDUR) 30 MG 24 hr tablet Take 1 tablet (30 mg total) by mouth daily. 90 tablet 2   nitroGLYCERIN (NITROSTAT) 0.4 MG SL tablet Place 1 tablet (0.4 mg total) under the tongue every 5 (five) minutes as needed for chest pain. 25 tablet 3   pantoprazole (PROTONIX) 40 MG tablet Take 1 tablet (40 mg total) by mouth daily. For stomach 30 tablet 11   rosuvastatin (CRESTOR) 40 MG tablet TAKE 1 TABLET BY MOUTH ONCE DAILY AT 6 PM IN THE EVENING 90 tablet 3   No current facility-administered medications on file prior to visit.    ROS Review of Systems  Constitutional:  Negative for fever.  HENT:  Positive for nosebleeds (2 days ago, sneezed and bled through 5-6 paper towels).   Respiratory:  Negative for shortness of breath.   Cardiovascular:  Negative for chest pain.  Musculoskeletal:  Positive for gait problem (; drop foot right leg. Has a brace. It's heavy so he only wears when on his feet a lot.). Negative for arthralgias.  Skin:  Negative for rash.    Objective:  BP 129/70   Pulse (!) 49   Temp (!) 97.1 F (36.2 C)   Ht 6' 2.5" (1.892 m)   Wt 211 lb 12.8 oz (96.1 kg)   SpO2 98%   BMI  26.83 kg/m   BP Readings from Last 3 Encounters:  04/03/23 129/70  12/30/22 128/72  08/25/22 139/78    Wt Readings from Last 3 Encounters:  04/03/23 211 lb 12.8 oz (96.1 kg)  12/30/22 207 lb 3.2 oz (94 kg)  10/21/22 212 lb (96.2 kg)     Physical Exam Vitals reviewed.  Constitutional:      Appearance: He is well-developed.  HENT:     Head: Normocephalic and atraumatic.     Right Ear: External ear normal.     Left Ear: External ear normal.      Mouth/Throat:     Pharynx: No oropharyngeal exudate or posterior oropharyngeal erythema.  Eyes:     Pupils: Pupils are equal, round, and reactive to light.  Cardiovascular:     Rate and Rhythm: Normal rate and regular rhythm.     Heart sounds: No murmur heard. Pulmonary:     Effort: No respiratory distress.     Breath sounds: Normal breath sounds.  Musculoskeletal:     Cervical back: Normal range of motion and neck supple.  Neurological:     Mental Status: He is alert and oriented to person, place, and time.     No results found for: "HGBA1C"  Lab Results  Component Value Date   WBC 5.3 08/25/2022   HGB 15.2 08/25/2022   HCT 44.9 08/25/2022   PLT 205 08/25/2022   GLUCOSE 113 (H) 08/25/2022   CHOL 137 08/25/2022   TRIG 92 08/25/2022   HDL 56 08/25/2022   LDLDIRECT 142.8 01/28/2008   LDLCALC 64 08/25/2022   ALT 12 08/25/2022   AST 17 08/25/2022   NA 141 08/25/2022   K 4.2 08/25/2022   CL 105 08/25/2022   CREATININE 1.38 (H) 08/25/2022   BUN 15 08/25/2022   CO2 24 08/25/2022   TSH 3.91 10/04/2017   PSA 1.2 06/13/2014    No results found.  Assessment & Plan:   Lanorris was seen today for medical management of chronic issues.  Diagnoses and all orders for this visit:  Hyperlipidemia with target LDL less than 70 -     Lipid panel  Essential hypertension, benign -     CBC with Differential/Platelet -     CMP14+EGFR   I have discontinued Shine R. Bigford's furosemide. I am also having him maintain his nitroGLYCERIN, aspirin EC, rosuvastatin, pantoprazole, isosorbide mononitrate, clopidogrel, and ezetimibe.  Discussed home care for epistaxis.    Follow-up: No follow-ups on file.  Mechele Claude, M.D.

## 2023-04-04 LAB — CMP14+EGFR
ALT: 14 [IU]/L (ref 0–44)
AST: 17 [IU]/L (ref 0–40)
Albumin: 4.3 g/dL (ref 3.7–4.7)
Alkaline Phosphatase: 124 [IU]/L — ABNORMAL HIGH (ref 44–121)
BUN/Creatinine Ratio: 14 (ref 10–24)
BUN: 18 mg/dL (ref 8–27)
Bilirubin Total: 1.5 mg/dL — ABNORMAL HIGH (ref 0.0–1.2)
CO2: 23 mmol/L (ref 20–29)
Calcium: 9 mg/dL (ref 8.6–10.2)
Chloride: 105 mmol/L (ref 96–106)
Creatinine, Ser: 1.3 mg/dL — ABNORMAL HIGH (ref 0.76–1.27)
Globulin, Total: 2.1 g/dL (ref 1.5–4.5)
Glucose: 95 mg/dL (ref 70–99)
Potassium: 4.6 mmol/L (ref 3.5–5.2)
Sodium: 140 mmol/L (ref 134–144)
Total Protein: 6.4 g/dL (ref 6.0–8.5)
eGFR: 55 mL/min/{1.73_m2} — ABNORMAL LOW (ref >59)

## 2023-04-04 LAB — CBC WITH DIFFERENTIAL/PLATELET
Basophils Absolute: 0 10*3/uL (ref 0.0–0.2)
Basos: 1 %
EOS (ABSOLUTE): 0.2 10*3/uL (ref 0.0–0.4)
Eos: 5 %
Hematocrit: 46.4 % (ref 37.5–51.0)
Hemoglobin: 15.2 g/dL (ref 13.0–17.7)
Immature Grans (Abs): 0 10*3/uL (ref 0.0–0.1)
Immature Granulocytes: 0 %
Lymphocytes Absolute: 1.5 10*3/uL (ref 0.7–3.1)
Lymphs: 30 %
MCH: 30.5 pg (ref 26.6–33.0)
MCHC: 32.8 g/dL (ref 31.5–35.7)
MCV: 93 fL (ref 79–97)
Monocytes Absolute: 0.4 10*3/uL (ref 0.1–0.9)
Monocytes: 8 %
Neutrophils Absolute: 2.8 10*3/uL (ref 1.4–7.0)
Neutrophils: 56 %
Platelets: 199 10*3/uL (ref 150–450)
RBC: 4.99 x10E6/uL (ref 4.14–5.80)
RDW: 12.1 % (ref 11.6–15.4)
WBC: 5 10*3/uL (ref 3.4–10.8)

## 2023-04-04 LAB — LIPID PANEL
Cholesterol, Total: 143 mg/dL (ref 100–199)
HDL: 54 mg/dL (ref >39)
LDL CALC COMMENT:: 2.6 ratio (ref 0.0–5.0)
LDL Chol Calc (NIH): 72 mg/dL (ref 0–99)
Triglycerides: 88 mg/dL (ref 0–149)
VLDL Cholesterol Cal: 17 mg/dL (ref 5–40)

## 2023-04-04 NOTE — Progress Notes (Signed)
Hello Tor,  Your lab result is normal and/or stable.Some minor variations that are not significant are commonly marked abnormal, but do not represent any medical problem for you.  Best regards, Mechele Claude, M.D.

## 2023-05-29 ENCOUNTER — Other Ambulatory Visit: Payer: Self-pay | Admitting: Cardiology

## 2023-06-02 ENCOUNTER — Other Ambulatory Visit: Payer: Self-pay | Admitting: Cardiology

## 2023-06-21 ENCOUNTER — Emergency Department (HOSPITAL_COMMUNITY)
Admission: EM | Admit: 2023-06-21 | Discharge: 2023-06-21 | Disposition: A | Discharge status: Home / Self Care | Payer: Medicare HMO

## 2023-06-21 ENCOUNTER — Ambulatory Visit (INDEPENDENT_AMBULATORY_CARE_PROVIDER_SITE_OTHER): Payer: Medicare HMO | Admitting: Family Medicine

## 2023-06-21 ENCOUNTER — Emergency Department (HOSPITAL_COMMUNITY): Payer: Medicare HMO

## 2023-06-21 ENCOUNTER — Encounter: Payer: Self-pay | Admitting: Family Medicine

## 2023-06-21 VITALS — BP 136/94 | HR 78 | Ht 74.5 in

## 2023-06-21 DIAGNOSIS — I2510 Atherosclerotic heart disease of native coronary artery without angina pectoris: Secondary | ICD-10-CM

## 2023-06-21 DIAGNOSIS — E876 Hypokalemia: Secondary | ICD-10-CM | POA: Diagnosis not present

## 2023-06-21 DIAGNOSIS — I252 Old myocardial infarction: Secondary | ICD-10-CM | POA: Diagnosis not present

## 2023-06-21 DIAGNOSIS — Z955 Presence of coronary angioplasty implant and graft: Secondary | ICD-10-CM | POA: Diagnosis not present

## 2023-06-21 DIAGNOSIS — M1990 Unspecified osteoarthritis, unspecified site: Secondary | ICD-10-CM | POA: Insufficient documentation

## 2023-06-21 DIAGNOSIS — R079 Chest pain, unspecified: Secondary | ICD-10-CM

## 2023-06-21 DIAGNOSIS — R918 Other nonspecific abnormal finding of lung field: Secondary | ICD-10-CM | POA: Diagnosis not present

## 2023-06-21 DIAGNOSIS — R0789 Other chest pain: Secondary | ICD-10-CM | POA: Diagnosis not present

## 2023-06-21 DIAGNOSIS — I700 Atherosclerosis of aorta: Secondary | ICD-10-CM | POA: Diagnosis not present

## 2023-06-21 DIAGNOSIS — I10 Essential (primary) hypertension: Secondary | ICD-10-CM | POA: Diagnosis not present

## 2023-06-21 DIAGNOSIS — Z743 Need for continuous supervision: Secondary | ICD-10-CM | POA: Diagnosis not present

## 2023-06-21 DIAGNOSIS — I509 Heart failure, unspecified: Secondary | ICD-10-CM | POA: Diagnosis not present

## 2023-06-21 DIAGNOSIS — I5042 Chronic combined systolic (congestive) and diastolic (congestive) heart failure: Secondary | ICD-10-CM | POA: Diagnosis not present

## 2023-06-21 DIAGNOSIS — M1710 Unilateral primary osteoarthritis, unspecified knee: Secondary | ICD-10-CM

## 2023-06-21 DIAGNOSIS — Z7982 Long term (current) use of aspirin: Secondary | ICD-10-CM | POA: Diagnosis not present

## 2023-06-21 DIAGNOSIS — I4430 Unspecified atrioventricular block: Secondary | ICD-10-CM | POA: Diagnosis not present

## 2023-06-21 DIAGNOSIS — I440 Atrioventricular block, first degree: Secondary | ICD-10-CM | POA: Diagnosis not present

## 2023-06-21 DIAGNOSIS — M7989 Other specified soft tissue disorders: Secondary | ICD-10-CM | POA: Diagnosis not present

## 2023-06-21 DIAGNOSIS — M1711 Unilateral primary osteoarthritis, right knee: Secondary | ICD-10-CM | POA: Diagnosis not present

## 2023-06-21 DIAGNOSIS — M25561 Pain in right knee: Secondary | ICD-10-CM | POA: Diagnosis not present

## 2023-06-21 LAB — BASIC METABOLIC PANEL
Anion gap: 10 (ref 5–15)
BUN: 16 mg/dL (ref 8–23)
CO2: 25 mmol/L (ref 22–32)
Calcium: 9.1 mg/dL (ref 8.9–10.3)
Chloride: 103 mmol/L (ref 98–111)
Creatinine, Ser: 1.26 mg/dL — ABNORMAL HIGH (ref 0.61–1.24)
GFR, Estimated: 57 mL/min — ABNORMAL LOW (ref >60)
Glucose, Bld: 112 mg/dL — ABNORMAL HIGH (ref 70–99)
Potassium: 3.4 mmol/L — ABNORMAL LOW (ref 3.5–5.1)
Sodium: 138 mmol/L (ref 135–145)

## 2023-06-21 LAB — CBC
HCT: 45.4 % (ref 39.0–52.0)
Hemoglobin: 15.4 g/dL (ref 13.0–17.0)
MCH: 31 pg (ref 26.0–34.0)
MCHC: 33.9 g/dL (ref 30.0–36.0)
MCV: 91.3 fL (ref 80.0–100.0)
Platelets: 178 10*3/uL (ref 150–400)
RBC: 4.97 MIL/uL (ref 4.22–5.81)
RDW: 13.1 % (ref 11.5–15.5)
WBC: 5.4 10*3/uL (ref 4.0–10.5)
nRBC: 0 % (ref 0.0–0.2)

## 2023-06-21 LAB — TROPONIN I (HIGH SENSITIVITY): Troponin I (High Sensitivity): 15 ng/L (ref <18)

## 2023-06-21 MED ORDER — ASPIRIN 81 MG PO CHEW
81.0000 mg | CHEWABLE_TABLET | Freq: Once | ORAL | Status: AC
  Administered 2023-06-21: 81 mg via ORAL

## 2023-06-21 MED ORDER — ACETAMINOPHEN 325 MG PO TABS
650.0000 mg | ORAL_TABLET | Freq: Once | ORAL | Status: AC
Start: 2023-06-21 — End: 2023-06-21
  Administered 2023-06-21: 650 mg via ORAL
  Filled 2023-06-21: qty 2

## 2023-06-21 MED ORDER — POTASSIUM CHLORIDE CRYS ER 20 MEQ PO TBCR
40.0000 meq | EXTENDED_RELEASE_TABLET | Freq: Once | ORAL | Status: AC
  Administered 2023-06-21: 40 meq via ORAL
  Filled 2023-06-21: qty 2

## 2023-06-21 MED ORDER — LIDOCAINE 5 % EX PTCH
1.0000 | MEDICATED_PATCH | CUTANEOUS | Status: DC
Start: 2023-06-21 — End: 2023-06-21
  Administered 2023-06-21: 1 via TRANSDERMAL
  Filled 2023-06-21: qty 1

## 2023-06-21 MED ORDER — LIDOCAINE 5 % EX PTCH: 1.0000 | MEDICATED_PATCH | CUTANEOUS | 0 refills | Status: DC

## 2023-06-21 MED ORDER — NITROGLYCERIN 0.4 MG SL SUBL
0.4000 mg | SUBLINGUAL_TABLET | Freq: Once | SUBLINGUAL | Status: AC
  Administered 2023-06-21: 0.4 mg via SUBLINGUAL

## 2023-06-21 NOTE — Discharge Instructions (Signed)
Please follow-up with your cardiologist in regards recent and ER visit.  Today your labs imaging are reassuring you most likely have a bruise on your chest causing chest discomfort.  You may use Tylenol every 6 hours as needed for pain along with the lidocaine patches I prescribed for you.  If symptoms change or worsen please return to the ER.

## 2023-06-21 NOTE — ED Triage Notes (Signed)
Pt /co chest pain for 2 days. Per EMS crushing pain, cardiac history with 8 stents central chest pain that does not radiate. Pt went to PCP this morning who called 911 to transfer to ED. Pt has had 1 nitroglycerin prior to arrival and 324 aspirin, and placed on 2L for comfort. Pt 98% RA with EMS.

## 2023-06-21 NOTE — Progress Notes (Signed)
Subjective:  Patient ID: [name], male    DOB: 1940/12/13, 83 y.o.   MRN: [id number]  Patient Care Team: Mechele Claude, MD as PCP - General (Family Medicine) Wyline Mood Dorothe Pea, MD as PCP - Cardiology (Cardiology) Van Clines, MD as Consulting Physician (Neurology)   Chief Complaint:  Chest Pain (8 stents )   HPI: [name] is a 83 y.o. male presenting on 06/21/2023 for Chest Pain (8 stents )   Chest Pain    1. Chest pain, unspecified type Patient presented to [facility] with two day history of chest pain. States that it is a constant pain. Has history of multiple stents. Denies N/V, headaches, SOB, diaphoresis. Elevated BP in office today. Last dose of plavix last night at 6 pm. Has not taken any medications today. Last PCI intervention 2020. States that he has been more dizzy and falling more frequently.    Relevant past medical, surgical, family, and social history reviewed and updated as indicated.  Allergies and medications reviewed and updated. Data reviewed: Chart in Epic.   Past Medical History:  Diagnosis Date   Acute combined systolic and diastolic heart failure (HCC) 07/19/2018   Bradycardia    CAD in native artery    Foot drop, right foot    History of GI bleed    History of mitral valve prolapse    History of peptic ulcer disease    Hyperlipidemia, mixed    Hypertension    MI (myocardial infarction) (HCC) 2000, 2004   Neuromuscular disorder (HCC)    PUD, HX OF 01/03/2009   Qualifier: Diagnosis of  By: Denyse Amass CMA, Carol     STEMI involving left circumflex coronary artery (HCC) 07/18/2018    Past Surgical History:  Procedure Laterality Date   CORONARY STENT INTERVENTION N/A 07/18/2018   Procedure: CORONARY STENT INTERVENTION;  Surgeon: Swaziland, Peter M, MD;  Location: [facility] INVASIVE CV LAB;  Service: Cardiovascular;  Laterality: N/A;   CORONARY STENT INTERVENTION N/A 07/20/2018   Procedure: CORONARY STENT INTERVENTION;  Surgeon: Swaziland, Peter M, MD;   Location: [facility] INVASIVE CV LAB;  Service: Cardiovascular;  Laterality: N/A;   CORONARY STENT PLACEMENT  2004, 2007   5 in 2004, 1 in 2007   CORONARY/GRAFT ACUTE MI REVASCULARIZATION N/A 07/18/2018   Procedure: Coronary/Graft Acute MI Revascularization;  Surgeon: Swaziland, Peter M, MD;  Location: [facility] INVASIVE CV LAB;  Service: Cardiovascular;  Laterality: N/A;   LEFT HEART CATH AND CORONARY ANGIOGRAPHY N/A 07/18/2018   Procedure: LEFT HEART CATH AND CORONARY ANGIOGRAPHY;  Surgeon: Swaziland, Peter M, MD;  Location: [facility] INVASIVE CV LAB;  Service: Cardiovascular;  Laterality: N/A;    Social History   Socioeconomic History   Marital status: Married    Spouse name: Mary   Number of children: 2   Years of education: 9   Highest education level: GED or equivalent  Occupational History   Occupation: Retired in 2006  Tobacco Use   Smoking status: Former    Current packs/day: 0.00    Average packs/day: 3.0 packs/day for 20.0 years (60.0 ttl pk-yrs)    Types: Cigarettes    Start date: 05/09/1956    Quit date: 05/09/1976    Years since quitting: 47.1   Smokeless tobacco: Never  Vaping Use   Vaping status: Never Used  Substance and Sexual Activity   Alcohol use: No   Drug use: No   Sexual activity: Not Currently    Partners: Female  Other Topics Concern  Not on file  Social History Narrative   Married and lives at home with his wife.   He has two children.    He is retired but stays active at home.   Right handed   Social Drivers of Health   Financial Resource Strain: Low Risk  (10/21/2022)   Overall Financial Resource Strain (CARDIA)    Difficulty of Paying Living Expenses: Not hard at all  Food Insecurity: No Food Insecurity (10/21/2022)   Hunger Vital Sign    Worried About Running Out of Food in the Last Year: Never true    Ran Out of Food in the Last Year: Never true  Transportation Needs: No Transportation Needs (10/21/2022)   PRAPARE - Administrator, Civil Service (Medical):  No    Lack of Transportation (Non-Medical): No  Physical Activity: Insufficiently Active (10/21/2022)   Exercise Vital Sign    Days of Exercise per Week: 3 days    Minutes of Exercise per Session: 30 min  Stress: No Stress Concern Present (10/21/2022)   [employer] of Occupational Health - Occupational Stress Questionnaire    Feeling of Stress : Not at all  Social Connections: Moderately Integrated (10/21/2022)   Social Connection and Isolation Panel [NHANES]    Frequency of Communication with Friends and Family: More than three times a week    Frequency of Social Gatherings with Friends and Family: More than three times a week    Attends Religious Services: More than 4 times per year    Active Member of [employer] or Organizations: No    Attends Banker Meetings: Never    Marital Status: Married  Catering manager Violence: Not At Risk (10/21/2022)   Humiliation, Afraid, Rape, and Kick questionnaire    Fear of Current or Ex-Partner: No    Emotionally Abused: No    Physically Abused: No    Sexually Abused: No    Outpatient Encounter Medications as of 06/21/2023  Medication Sig   aspirin 81 MG EC tablet Take 1 tablet (81 mg total) by mouth every other day.   clopidogrel (PLAVIX) 75 MG tablet TAKE 1 TABLET BY MOUTH ONCE DAILY . APPOINTMENT REQUIRED FOR FUTURE REFILLS   ezetimibe (ZETIA) 10 MG tablet TAKE 1 TABLET BY MOUTH ONCE DAILY . APPOINTMENT REQUIRED FOR FUTURE REFILLS   isosorbide mononitrate (IMDUR) 30 MG 24 hr tablet Take 1 tablet (30 mg total) by mouth daily.   nitroGLYCERIN (NITROSTAT) 0.4 MG SL tablet Place 1 tablet (0.4 mg total) under the tongue every 5 (five) minutes as needed for chest pain.   pantoprazole (PROTONIX) 40 MG tablet Take 1 tablet (40 mg total) by mouth daily. For stomach   rosuvastatin (CRESTOR) 40 MG tablet TAKE 1 TABLET BY MOUTH ONCE DAILY AT 6 PM IN THE EVENING   [EXPIRED] aspirin chewable tablet 81 mg    [EXPIRED] aspirin chewable tablet 81 mg     [EXPIRED] nitroGLYCERIN (NITROSTAT) SL tablet 0.4 mg    No facility-administered encounter medications on file as of 06/21/2023.    Allergies  Allergen Reactions   Brilinta [Ticagrelor] Shortness Of Breath   Ibuprofen Nausea Only    Review of Systems  Cardiovascular:  Positive for chest pain.    Objective:  BP (!) 136/94   Pulse 78   Ht 6' 2.5" (1.892 m)   SpO2 94%   BMI 26.83 kg/m    Wt Readings from Last 3 Encounters:  04/03/23 211 lb 12.8 oz (96.1 kg)  12/30/22 207  lb 3.2 oz (94 kg)  10/21/22 212 lb (96.2 kg)   Physical Exam Constitutional:      General: He is awake. He is in acute distress.     Appearance: He is ill-appearing. He is not toxic-appearing or diaphoretic.  Cardiovascular:     Rate and Rhythm: Normal rate and regular rhythm.     Heart sounds: Normal heart sounds.  Pulmonary:     Effort: Tachypnea present.     Breath sounds: Normal breath sounds.     Comments: Increased work of breathing  Musculoskeletal:     Right lower leg: No edema.     Left lower leg: No edema.  Skin:    General: Skin is warm.     Capillary Refill: Capillary refill takes less than 2 seconds.  Neurological:     General: No focal deficit present.     Mental Status: He is alert, oriented to person, place, and time and easily aroused. Mental status is at baseline.  Psychiatric:        Attention and Perception: Attention and perception normal.        Behavior: Behavior is cooperative.     Results for orders placed or performed in visit on 04/03/23  CBC with Differential/Platelet   Collection Time: 04/03/23 10:35 AM  Result Value Ref Range   WBC 5.0 3.4 - 10.8 x10E3/uL   RBC 4.99 4.14 - 5.80 x10E6/uL   Hemoglobin 15.2 13.0 - 17.7 g/dL   Hematocrit 02.5 42.7 - 51.0 %   MCV 93 79 - 97 fL   MCH 30.5 26.6 - 33.0 pg   MCHC 32.8 31.5 - 35.7 g/dL   RDW 06.2 37.6 - 28.3 %   Platelets 199 150 - 450 x10E3/uL   Neutrophils 56 Not Estab. %   Lymphs 30 Not Estab. %   Monocytes 8  Not Estab. %   Eos 5 Not Estab. %   Basos 1 Not Estab. %   Neutrophils Absolute 2.8 1.4 - 7.0 x10E3/uL   Lymphocytes Absolute 1.5 0.7 - 3.1 x10E3/uL   Monocytes Absolute 0.4 0.1 - 0.9 x10E3/uL   EOS (ABSOLUTE) 0.2 0.0 - 0.4 x10E3/uL   Basophils Absolute 0.0 0.0 - 0.2 x10E3/uL   Immature Granulocytes 0 Not Estab. %   Immature Grans (Abs) 0.0 0.0 - 0.1 x10E3/uL  CMP14+EGFR   Collection Time: 04/03/23 10:35 AM  Result Value Ref Range   Glucose 95 70 - 99 mg/dL   BUN 18 8 - 27 mg/dL   Creatinine, Ser 1.51 (H) 0.76 - 1.27 mg/dL   eGFR 55 (L) >76 HY/WVP/7.10   BUN/Creatinine Ratio 14 10 - 24   Sodium 140 134 - 144 mmol/L   Potassium 4.6 3.5 - 5.2 mmol/L   Chloride 105 96 - 106 mmol/L   CO2 23 20 - 29 mmol/L   Calcium 9.0 8.6 - 10.2 mg/dL   Total Protein 6.4 6.0 - 8.5 g/dL   Albumin 4.3 3.7 - 4.7 g/dL   Globulin, Total 2.1 1.5 - 4.5 g/dL   Bilirubin Total 1.5 (H) 0.0 - 1.2 mg/dL   Alkaline Phosphatase 124 (H) 44 - 121 IU/L   AST 17 0 - 40 IU/L   ALT 14 0 - 44 IU/L  Lipid panel   Collection Time: 04/03/23 10:35 AM  Result Value Ref Range   Cholesterol, Total 143 100 - 199 mg/dL   Triglycerides 88 0 - 149 mg/dL   HDL 54 >62 mg/dL   VLDL Cholesterol Cal 17 5 -  40 mg/dL   LDL Chol Calc (NIH) 72 0 - 99 mg/dL   Chol/HDL Ratio 2.6 0.0 - 5.0 ratio       04/03/2023    9:40 AM 10/21/2022    9:21 AM 08/25/2022    8:29 AM 02/23/2022    9:11 AM 02/23/2022    9:10 AM  Depression screen PHQ 2/9  Decreased Interest 0 0 0 0 0  Down, Depressed, Hopeless 0 0 0 0 0  PHQ - 2 Score 0 0 0 0 0  Difficult doing work/chores     Not difficult at all       02/03/2022    8:15 AM 02/10/2016    9:37 AM  GAD 7 : Generalized Anxiety Score  Nervous, Anxious, on Edge 0 0  Control/stop worrying 0 0  Worry too much - different things 0 0  Trouble relaxing 1 0  Restless 1 0  Easily annoyed or irritable 0 0  Afraid - awful might happen 0 0  Total GAD 7 Score 2 0  Anxiety Difficulty Not difficult at  all    Pertinent labs & imaging results that were available during my care of the patient were reviewed by me and considered in my medical decision making.  Assessment & Plan:  [place] was seen today for chest pain.  Diagnoses and all orders for this visit:  Chest pain, unspecified type Reviewed EKG. Ruled out STEMI. Provided medication as below. Denied use of viagra in the last 24 hours. Pain did not resolve with nitroglycerin tablet. EMS called and patient agreed for EMS transport to [facility] for further evaluation. BP on arrival 180s/100s. After nitroglycerin, BP 130s/90s.  -     EKG 12-Lead -     aspirin chewable tablet 81 mg x 4 = 325 mg of ASA total  -     nitroGLYCERIN (NITROSTAT) SL tablet 0.4 mg  ASCVD (arteriosclerotic cardiovascular disease) As above   Chronic combined systolic and diastolic congestive heart failure (HCC) As above.    Continue all other maintenance medications.  Follow up plan: Return if symptoms worsen or fail to improve.   Continue healthy lifestyle choices, including diet (rich in fruits, vegetables, and lean proteins, and low in salt and simple carbohydrates) and exercise (at least 30 minutes of moderate physical activity daily).  Written and verbal instructions provided   The above assessment and management plan was discussed with the patient. The patient verbalized understanding of and has agreed to the management plan. Patient is aware to call the clinic if they develop any new symptoms or if symptoms persist or worsen. Patient is aware when to return to the clinic for a follow-up visit. Patient educated on when it is appropriate to go to the emergency department.   Neale Burly, DNP-FNP [facility] [address] [place], [place] [address] [phone]

## 2023-06-21 NOTE — ED Provider Notes (Signed)
[place] EMERGENCY DEPARTMENT AT [facility] Provider Note   CSN: [id number] Arrival date & time: 06/21/23  4098     History  Chief Complaint  Patient presents with   Chest Pain    [name] is a 83 y.o. male history of stent placement x 8, CHF, STEMI presented for left-sided chest pain for the past 2 days.  Patient has had multiple mechanical falls at home which his feet keep getting stuck on the rug and he has landed on his right knee and says that hurts slightly but is not take any medications for it.  Patient will walk since then.  Patient states that the left side of his chest hurts when he presses on it but is not taking any medications.  Patient denies any radiation of this chest pain or shortness of breath or abdominal pain.  Patient states he is only on aspirin.  Patient denies falling and hitting his head or neck and denies any pain to this region.    Home Medications Prior to Admission medications   Medication Sig Start Date End Date Taking? Authorizing Provider  lidocaine (LIDODERM) 5 % Place 1 patch onto the skin daily. Remove & Discard patch within 12 hours or as directed by MD 06/21/23  Yes Netta Corrigan, PA-C  aspirin 81 MG EC tablet Take 1 tablet (81 mg total) by mouth every other day. 02/23/21   Mechele Claude, MD  clopidogrel (PLAVIX) 75 MG tablet TAKE 1 TABLET BY MOUTH ONCE DAILY . APPOINTMENT REQUIRED FOR FUTURE REFILLS 05/29/23   Antoine Poche, MD  ezetimibe (ZETIA) 10 MG tablet TAKE 1 TABLET BY MOUTH ONCE DAILY . APPOINTMENT REQUIRED FOR FUTURE REFILLS 06/02/23   Antoine Poche, MD  isosorbide mononitrate (IMDUR) 30 MG 24 hr tablet Take 1 tablet (30 mg total) by mouth daily. 12/30/22   Antoine Poche, MD  nitroGLYCERIN (NITROSTAT) 0.4 MG SL tablet Place 1 tablet (0.4 mg total) under the tongue every 5 (five) minutes as needed for chest pain. 12/18/20   Antoine Poche, MD  pantoprazole (PROTONIX) 40 MG tablet Take 1 tablet (40 mg total)  by mouth daily. For stomach 08/25/22   Mechele Claude, MD  rosuvastatin (CRESTOR) 40 MG tablet TAKE 1 TABLET BY MOUTH ONCE DAILY AT 6 PM IN THE EVENING 07/01/22   Branch, Dorothe Pea, MD      Allergies    Brilinta [ticagrelor] and Ibuprofen    Review of Systems   Review of Systems  Cardiovascular:  Positive for chest pain.    Physical Exam Updated Vital Signs BP (!) 164/101   Pulse 64   Temp 98.3 F (36.8 C) (Oral)   Resp 15   SpO2 96%  Physical Exam Constitutional:      General: He is not in acute distress. HENT:     Head: Normocephalic and atraumatic.  Eyes:     Extraocular Movements: Extraocular movements intact.     Conjunctiva/sclera: Conjunctivae normal.     Pupils: Pupils are equal, round, and reactive to light.  Cardiovascular:     Rate and Rhythm: Normal rate and regular rhythm.     Pulses: Normal pulses.     Heart sounds: Normal heart sounds.  Pulmonary:     Effort: Pulmonary effort is normal. No respiratory distress.     Breath sounds: Normal breath sounds.     Comments: Able to speak in full sentences Abdominal:     Palpations: Abdomen is soft.  Tenderness: There is no abdominal tenderness. There is no guarding or rebound.  Musculoskeletal:     Cervical back: Normal range of motion. No tenderness.     Right lower leg: No tenderness. No edema.     Left lower leg: No tenderness. No edema.     Comments: Able to reproduce chest pain with palpation of left ribs with bony tenderness without crepitus or step-off No midline tenderness or abnormalities noted  Skin:    General: Skin is warm and dry.     Capillary Refill: Capillary refill takes less than 2 seconds.  Neurological:     General: No focal deficit present.     Mental Status: He is alert.  Psychiatric:        Mood and Affect: Mood normal.     ED Results / Procedures / Treatments   Labs (all labs ordered are listed, but only abnormal results are displayed) Labs Reviewed  BASIC METABOLIC PANEL -  Abnormal; Notable for the following components:      Result Value   Potassium 3.4 (*)    Glucose, Bld 112 (*)    Creatinine, Ser 1.26 (*)    GFR, Estimated 57 (*)    All other components within normal limits  CBC  TROPONIN I (HIGH SENSITIVITY)    EKG EKG Interpretation Date/Time:  Wednesday June 21 2023 09:47:26 EST Ventricular Rate:  68 PR Interval:  222 QRS Duration:  99 QT Interval:  404 QTC Calculation: 430 R Axis:   -36  Text Interpretation: Sinus rhythm Prolonged PR interval Left axis deviation Abnormal R-wave progression, late transition Nonspecific T abnormalities, lateral leads Confirmed by Estanislado Pandy [phone]) on 06/21/2023 9:49:19 AM  Radiology DG Knee Complete 4 Views Right Result Date: 06/21/2023 CLINICAL DATA:  Diffuse knee pain.  Swelling. EXAM: RIGHT KNEE - COMPLETE 4+ VIEW COMPARISON:  None Available. FINDINGS: No acute fracture or dislocation. No aggressive osseous lesion. There are degenerative changes of the knee joint in the form of mildly reduced medial tibio-femoral compartment joint space, tibial spiking and tricompartmental osteophytosis. No knee effusion or focal soft tissue swelling. No radiopaque foreign bodies. IMPRESSION: No acute osseous abnormality of the right knee. Electronically Signed   By: Jules Schick M.D.   On: 06/21/2023 11:54   DG Chest Port 1 View Result Date: 06/21/2023 CLINICAL DATA:  Chest pain EXAM: PORTABLE CHEST 1 VIEW COMPARISON:  02/21/2019 FINDINGS: The heart size and mediastinal contours are within normal limits. Aortic atherosclerosis. Chronic biapical pleuroparenchymal scarring. Mildly coarsened interstitial markings bilaterally. No focal airspace consolidation, pleural effusion, or pneumothorax. The visualized skeletal structures are unremarkable. IMPRESSION: Mildly coarsened interstitial markings bilaterally, which may reflect bronchitic type lung changes. No focal airspace consolidation. Electronically Signed   By: Duanne Guess D.O.   On: 06/21/2023 11:26    Procedures Procedures    Medications Ordered in ED Medications  lidocaine (LIDODERM) 5 % 1 patch (1 patch Transdermal Patch Applied 06/21/23 1148)  acetaminophen (TYLENOL) tablet 650 mg (650 mg Oral Given 06/21/23 1147)  potassium chloride SA (KLOR-CON M) CR tablet 40 mEq (40 mEq Oral Given 06/21/23 1147)    ED Course/ Medical Decision Making/ A&P                                 Medical Decision Making Amount and/or Complexity of Data Reviewed Labs: ordered. Radiology: ordered.  Risk OTC drugs. Prescription drug management.   Christean Grief  83 y.o. presented today for chest pain. Working DDx that I considered at this time includes, but not limited to, ACS, pulmonary embolism, community-acquired pneumonia, aortic dissection, pneumothorax, underlying bony abnormality, anemia, thyrotoxicosis, HTN urgency/emergency, esophageal rupture, CHF exacerbation, valvular disorder, myocarditis, pericarditis, endocarditis, pericardial effusion/cardiac tamponade, pulmonary edema, gastritis/PUD/GERD, esophagitis, MSK.  R/o Dx: ACS, pulmonary embolism, community-acquired pneumonia, aortic dissection, pneumothorax, underlying bony abnormality, anemia, thyrotoxicosis, HTN urgency/emergency, esophageal rupture, CHF exacerbation, valvular disorder, myocarditis, pericarditis, endocarditis, pericardial effusion/cardiac tamponade, pulmonary edema, gastritis/PUD/GERD, esophagitis, septic arthritis: These are considered less likely due to history of present illness and physical exam findings. Aortic Dissection: less likely based on the location, quality, onset, and severity of symptoms in this case. Patient also has a lack of underlying history of AD or TAA.   Review of prior external notes: 08/25/2022 office visit  Unique Tests and My Interpretation:  EKG: Rate, rhythm, axis, intervals all examined and without medically relevant abnormality. ST segments without concerns  for elevations Troponin: 15 CXR: Bronchitic changes Right knee x-ray: Arthritic changes noted CBC: Unremarkable BMP: Mild hypokalemia 3.4  Social Determinants of Health: none  Discussion with Independent Historian: None  Discussion of Management of Tests: None  Risk: Medium: prescription drug management  Risk Stratification Score: none  Staffed with Young, DO  Plan: On exam patient was in no acute distress with stable vitals. Patient's physical was remarkable for weight producing patient's chest pain with palpation of his left ribs without bony abnormalities. Labs and CXR will be ordered.  The cardiac monitor was ordered secondary to the patient's history of chest pain and to monitor the patient for dysrhythmia. The cardiac monitor revealed normal sinus as interpreted by me.  Do feel patient's had multiple chemical falls at home causing him to fall which could have led to his MSK suspected chest pain.  Will give patient Tylenol and lidoderm here anticipate discharge with cardiology follow-up.  Patient stable at this time.  For troponin was negative so will not obtain second as patient's pain is most likely MSK in nature.  Still waiting the rest of imaging but do anticipate discharge.  Patient's potassium slightly low and will be replenished here.  The rest the patient's labs and imaging are reassuring.  Patient does have some arthritis in his right knee which I suspect was exacerbated from his falls and will give him a knee sleeve here as he does not have 1 at home.  With patient's labs being reassuring do feel that he is safe to be discharged after discussion with the attending.  I spoke to the patient about following up with his cardiologist and using a knee sleeve and Tylenol every 6 hours as needed for his knee and chest.  Also will prescribe lidocaine patches.  Patient was in agreement with this plan.  Patient was given return precautions. Patient stable for discharge at this time.   Patient verbalized understanding of plan.  This chart was dictated using voice recognition software.  Despite best efforts to proofread,  errors can occur which can change the documentation meaning.        Final Clinical Impression(s) / ED Diagnoses Final diagnoses:  Chest wall pain  Hypokalemia  Arthritis of knee    Rx / DC Orders ED Discharge Orders          Ordered    lidocaine (LIDODERM) 5 %  Every 24 hours        06/21/23 1049  Netta Corrigan, PA-C 06/21/23 1231    Coral Spikes, DO 06/21/23 [phone]

## 2023-06-22 ENCOUNTER — Telehealth: Payer: Self-pay

## 2023-06-22 NOTE — Transitions of Care (Post Inpatient/ED Visit) (Signed)
   06/22/2023  Name: [name] MRN: [id number] DOB: 05-11-1940  Today's TOC FU Call Status: Today's TOC FU Call Status:: Successful TOC FU Call Completed TOC FU Call Complete Date: 06/22/23 Patient's Name and Date of Birth confirmed.  Transition Care Management Follow-up Telephone Call Date of Discharge: 06/21/23 [facility] Type of Discharge: Emergency Department Reason for ED Visit: Other: (osteoarthritis) How have you been since you were released from the hospital?: Same Any questions or concerns?: No  Items Reviewed: Did you receive and understand the discharge instructions provided?: Yes Medications obtained,verified, and reconciled?: Yes (Medications Reviewed) Any new allergies since your discharge?: No Dietary orders reviewed?: Yes Do you have support at home?: Yes People in Home: spouse  Medications Reviewed Today: Medications Reviewed Today     Reviewed by Karena Addison, LPN (Licensed Practical Nurse) on 06/22/23 at 1431  Med List Status: <None>   Medication Order Taking? Sig Documenting Provider Last Dose Status Informant  aspirin 81 MG EC tablet [id number] No Take 1 tablet (81 mg total) by mouth every other day. Mechele Claude, MD Taking Active   clopidogrel (PLAVIX) 75 MG tablet [id number]  TAKE 1 TABLET BY MOUTH ONCE DAILY . APPOINTMENT REQUIRED FOR FUTURE REFILLS Antoine Poche, MD  Active   ezetimibe (ZETIA) 10 MG tablet [id number]  TAKE 1 TABLET BY MOUTH ONCE DAILY . APPOINTMENT REQUIRED FOR FUTURE REFILLS Branch, Dorothe Pea, MD  Active   isosorbide mononitrate (IMDUR) 30 MG 24 hr tablet [id number] No Take 1 tablet (30 mg total) by mouth daily. Antoine Poche, MD Taking Active   lidocaine (LIDODERM) 5 % [id number]  Place 1 patch onto the skin daily. Remove & Discard patch within 12 hours or as directed by MD Netta Corrigan, PA-C  Active   nitroGLYCERIN (NITROSTAT) 0.4 MG SL tablet [id number] No Place 1 tablet (0.4 mg total) under  the tongue every 5 (five) minutes as needed for chest pain. Antoine Poche, MD Taking Active   pantoprazole (PROTONIX) 40 MG tablet [id number] No Take 1 tablet (40 mg total) by mouth daily. For stomach Mechele Claude, MD Taking Active   rosuvastatin (CRESTOR) 40 MG tablet [id number] No TAKE 1 TABLET BY MOUTH ONCE DAILY AT 6 PM IN THE EVENING Branch, Dorothe Pea, MD Taking Active             Home Care and Equipment/Supplies: Were Home Health Services Ordered?: NA Any new equipment or medical supplies ordered?: NA  Functional Questionnaire: Do you need assistance with bathing/showering or dressing?: No Do you need assistance with meal preparation?: No Do you need assistance with eating?: No Do you have difficulty maintaining continence: No Do you need assistance with getting out of bed/getting out of a chair/moving?: No Do you have difficulty managing or taking your medications?: No  Follow up appointments reviewed: PCP Follow-up appointment confirmed?: No (no avail appts, sent message to staff to schedule) MD Provider Line Number:[phone] Given: No Specialist Hospital Follow-up appointment confirmed?: NA Do you need transportation to your follow-up appointment?: No Do you understand care options if your condition(s) worsen?: Yes-patient verbalized understanding    SIGNATURE Karena Addison, LPN [facility] Nurse Health Advisor Direct Dial [phone]

## 2023-06-26 ENCOUNTER — Encounter: Payer: Self-pay | Admitting: Nurse Practitioner

## 2023-06-26 ENCOUNTER — Ambulatory Visit (INDEPENDENT_AMBULATORY_CARE_PROVIDER_SITE_OTHER): Payer: Medicare HMO | Admitting: Nurse Practitioner

## 2023-06-26 ENCOUNTER — Inpatient Hospital Stay: Payer: Medicare HMO | Admitting: Family Medicine

## 2023-06-26 VITALS — BP 162/75 | HR 75 | Temp 97.8°F | Wt 214.0 lb

## 2023-06-26 DIAGNOSIS — E876 Hypokalemia: Secondary | ICD-10-CM | POA: Diagnosis not present

## 2023-06-26 DIAGNOSIS — B029 Zoster without complications: Secondary | ICD-10-CM | POA: Diagnosis not present

## 2023-06-26 MED ORDER — VALACYCLOVIR HCL 1 G PO TABS
1000.0000 mg | ORAL_TABLET | Freq: Two times a day (BID) | ORAL | 0 refills | Status: DC
Start: 2023-06-26 — End: 2023-10-03

## 2023-06-26 MED ORDER — GABAPENTIN 300 MG PO CAPS
300.0000 mg | ORAL_CAPSULE | Freq: Three times a day (TID) | ORAL | 0 refills | Status: DC
Start: 2023-06-26 — End: 2023-07-06

## 2023-06-26 NOTE — Progress Notes (Signed)
   Subjective:    Patient ID: [name], male    DOB: Feb 10, 1941, 83 y.o.   MRN: [id number]  Today's visit was for Transitional Care Management.  The patient was discharged from [facility] on 06/21/23 with a primary diagnosis of osteoarthritis.   Contact with the patient and/or caregiver, by a clinical staff member, was made on 06/22/23 and was documented as a telephone encounter within the EMR.  Through chart review and discussion with the patient I have determined that management of their condition is of moderate complexity.   Patient was seen in ED with chest wall pain. His heart checked out good. He had falle several days prior so they thought that was what caused chest wall pain. He presents today with a rash on chest. Rash started on 06/21/23. Rates pain 10/10.      Review of Systems  Constitutional:  Negative for diaphoresis.  Eyes:  Negative for pain.  Respiratory:  Negative for shortness of breath.   Cardiovascular:  Negative for chest pain, palpitations and leg swelling.  Gastrointestinal:  Negative for abdominal pain.  Endocrine: Negative for polydipsia.  Skin:  Negative for rash.  Neurological:  Negative for dizziness, weakness and headaches.  Hematological:  Does not bruise/bleed easily.  All other systems reviewed and are negative.      Objective:   Physical Exam Constitutional:      Appearance: Normal appearance.  Cardiovascular:     Rate and Rhythm: Normal rate and regular rhythm.     Heart sounds: Normal heart sounds.  Pulmonary:     Effort: Pulmonary effort is normal.     Breath sounds: Normal breath sounds.  Skin:    General: Skin is warm.     Findings: Rash (erythematous vesicular lesions on left flank that wrap around chest wall to back) present.  Neurological:     General: No focal deficit present.     Mental Status: He is alert and oriented to person, place, and time.  Psychiatric:        Mood and Affect: Mood normal.        Behavior: Behavior  normal.    BP (!) 162/75   Pulse 75   Temp 97.8 F (36.6 C) (Temporal)   Wt 214 lb (97.1 kg)   SpO2 97%   BMI 27.11 kg/m         Assessment & Plan:   Christean Grief in today with chief complaint of Transitions Of Care (Rash shingles?)   1. Herpes zoster without complication (Primary) Ice if helps Avoid rubbing or scratching - valACYclovir (VALTREX) 1000 MG tablet; Take 1 tablet (1,000 mg total) by mouth 2 (two) times daily.  Dispense: 21 tablet; Refill: 0 - gabapentin (NEURONTIN) 300 MG capsule; Take 1 capsule (300 mg total) by mouth 3 (three) times daily.  Dispense: 40 capsule; Refill: 0  2. Hypokalemia Labs pending - BMP8+EGFR    The above assessment and management plan was discussed with the patient. The patient verbalized understanding of and has agreed to the management plan. Patient is aware to call the clinic if symptoms persist or worsen. Patient is aware when to return to the clinic for a follow-up visit. Patient educated on when it is appropriate to go to the emergency department.   Mary-Margaret Daphine Deutscher, FNP

## 2023-06-26 NOTE — Patient Instructions (Signed)
 Shingles  Shingles, or herpes zoster, is an infection. It gives you a skin rash and blisters. These infected areas may hurt a lot. Shingles only happens if: You've had chickenpox. You've been given a shot called a vaccine to protect you from getting chickenpox. Shingles is rare in this case. What are the causes? Shingles is caused by a germ called the varicella-zoster virus. This is the same germ that causes chickenpox. After you're exposed to the germ, it stays in your body but is dormant. This means it isn't active. Shingles happens if the germ becomes active again. This can happen years after you're first exposed to the germ. What increases the risk? You may be more likely to get shingles if: You're older than 82 years of age. You're under a lot of stress. You have a weak immune system. The immune system is your body's defense system. It may be weak if: You have human immunodeficiency virus (HIV). You have acquired immunodeficiency syndrome (AIDS). You have cancer. You take medicines that weaken your immune system. These include organ transplant medicines. What are the signs or symptoms? The first symptoms of shingles may be itching, tingling, or pain. Your skin may feel like it's burning. A few days or weeks later, you'll get a rash. Here's what you can expect: The rash is likely to be on one side of your body. The rash may be shaped like a belt or a band. Over time, it will turn into blisters filled with fluid. The blisters will break open and change into scabs. The scabs will dry up in about 2-3 weeks. You may also have: A fever. Chills. A headache. Nausea. How is this diagnosed? Shingles is diagnosed with a skin exam. A sample called a culture may be taken from one of your blisters and sent to a lab. This will show if you have shingles. How is this treated? The rash may last for several weeks. There's no cure for shingles, but your health care provider may give you medicines.  These medicines may: Help with pain. Help with itching. Help with irritation and swelling. Help you get better sooner. Help to prevent long-term problems. If the rash is on your face, you may need to see an eye doctor or an ear, nose, and throat (ENT) doctor. Follow these instructions at home: Medicines Take your medicines only as told by your provider. Put an anti-itch cream or numbing cream on the rash or blisters as told by your provider. Relieving itching and discomfort  To help with itching: Put cold, wet cloths called cold compresses on the rash or blisters. Take a cool bath. Try adding baking soda or dry oatmeal to the water. Do not bathe in hot water. Use calamine lotion on the rash or blisters. You can get this type of lotion at the store. Blister and rash care Keep your rash covered with a loose bandage. Wear loose clothes that don't rub on your rash. Take care of your rash as told by your provider. Make sure you: Wash your hands with soap and water for at least 20 seconds before and after you change your bandage. If you can't use soap and water, use hand sanitizer. Keep your rash and blisters clean by washing them with mild soap and cool water. Change your bandage. Check your rash every day for signs of infection. Check for: More redness, swelling, or pain. Fluid or blood. Warmth. Pus or a bad smell. Do not scratch your rash. Do not pick at your  blisters. To help you not scratch: Keep your fingernails clean and cut short. Try to wear gloves or mittens when you sleep. General instructions Rest. Wash your hands often with soap and water for at least 20 seconds. If you can't use soap and water, use hand sanitizer. Washing your hands lowers your chance of getting a skin infection. Your infection can cause chickenpox in others. If you have blisters that aren't scabs yet, stay away from: Babies. Pregnant people. Children who have eczema. Older people who have organ  transplants. People who have a long-term, or chronic, illness. Anyone who hasn't had chickenpox before. Anyone who hasn't gotten the chickenpox vaccine. How is this prevented? Vaccines are the best way to prevent you from getting chickenpox or shingles. Talk with your provider about getting these shots. Where to find more information Centers for Disease Control and Prevention (CDC): [employer].no Contact a health care provider if: Your pain doesn't get better with medicine. Your pain doesn't get better after the rash heals. You have any signs of infection around the rash. Your rash or blisters get worse. You have a fever or chills. Get help right away if: The rash is on your face or nose. You have pain in your face or by your eye. You lose feeling on one side of your face. You have trouble seeing. You have ear pain or ringing in your ear. This information is not intended to replace advice given to you by your health care provider. Make sure you discuss any questions you have with your health care provider. Document Revised: 01/26/2023 Document Reviewed: 06/10/2022 Elsevier Patient Education  2024 [employer].

## 2023-06-27 ENCOUNTER — Ambulatory Visit: Payer: Medicare HMO | Admitting: Family Medicine

## 2023-06-27 LAB — BMP8+EGFR
BUN/Creatinine Ratio: 10 (ref 10–24)
BUN: 14 mg/dL (ref 8–27)
CO2: 23 mmol/L (ref 20–29)
Calcium: 9 mg/dL (ref 8.6–10.2)
Chloride: 106 mmol/L (ref 96–106)
Creatinine, Ser: 1.36 mg/dL — ABNORMAL HIGH (ref 0.76–1.27)
Glucose: 107 mg/dL — ABNORMAL HIGH (ref 70–99)
Potassium: 4 mmol/L (ref 3.5–5.2)
Sodium: 143 mmol/L (ref 134–144)
eGFR: 52 mL/min/{1.73_m2} — ABNORMAL LOW (ref >59)

## 2023-07-01 DIAGNOSIS — H9202 Otalgia, left ear: Secondary | ICD-10-CM | POA: Diagnosis not present

## 2023-07-05 ENCOUNTER — Encounter: Payer: Self-pay | Admitting: Cardiology

## 2023-07-05 ENCOUNTER — Ambulatory Visit: Payer: Self-pay | Admitting: Family Medicine

## 2023-07-05 ENCOUNTER — Ambulatory Visit: Payer: Medicare HMO | Attending: Cardiology | Admitting: Cardiology

## 2023-07-05 VITALS — BP 120/80 | HR 81 | Ht 74.0 in | Wt 215.0 lb

## 2023-07-05 DIAGNOSIS — I10 Essential (primary) hypertension: Secondary | ICD-10-CM | POA: Diagnosis not present

## 2023-07-05 DIAGNOSIS — I5032 Chronic diastolic (congestive) heart failure: Secondary | ICD-10-CM

## 2023-07-05 DIAGNOSIS — I2510 Atherosclerotic heart disease of native coronary artery without angina pectoris: Secondary | ICD-10-CM

## 2023-07-05 DIAGNOSIS — E782 Mixed hyperlipidemia: Secondary | ICD-10-CM

## 2023-07-05 NOTE — Progress Notes (Signed)
 Clinical Summary Mr. Lindahl is a 83 y.o.male seen today for follow up of the following medical problems.    1. CAD - multiple PCIs in the past - hx of MI in 2004, with stenting to RCA (ostium down to PDA). Of note had GI bleed 2 months later on DAPT and NSAIDs requiring transfusion.   - cath from 2007 with occluded LCX OM, received a BMS. RCA stents were patent at that time. LVEF 70% by LV gram at that time, no echoes in our system     - admit 07/2018 with acute posterior STEMI, taken to cath lab - 07/2018 cath showed ostal LAD 60%, mid LAD 99%, D1 95%, OM1 occluded, RCA 30%, mid RCA 95%. Received DES to OM1, then staged PCI to RCA. From notes no options on treating LAD, diffusely diseased and no target for CABG. Recs for indefinitie DAPT. LVEDP 29, limited diuresis due to uptrend in Cr.     - 06/21/23 ER visit with chest pain - multiple recent falls - from ER notes patient noted pain worst with pressing on area - trops negative, EKG no ischemic changes - later at pcp f/u had developed a rash on chest, treated for shingles.  - compliant with meds   2. Chronic diastolic HF - no SOB/DOE, no LE edema.  - has prn lasix, takes just about once a month.   - no SOB/DOE, no recent edema   3. HTN - compliant with meds   4. Hyperlipidemia   - 02/2020 TC 142 TG 87 HDL 51 LDL 74 - 08/2020 TC 161 TG 096 HL 45 LDL 72 - he is on crestor 40, zetia 10   02/2021 TC 160 TG 107 HDL 52 LDL 89 -02/2022 TC 045 TG 87 HDL 50 LDL 87 08/2022 TC 137 TG 92 HDL 56 LDL 64 - 03/2023 TC 143 TG 88 HDL 54 LDL 72   5. Chronic bradycardia - occasional lightheadedness with standing fast.  Past Medical History:  Diagnosis Date   Acute combined systolic and diastolic heart failure (HCC) 07/19/2018   Bradycardia    CAD in native artery    Foot drop, right foot    History of GI bleed    History of mitral valve prolapse    History of peptic ulcer disease    Hyperlipidemia, mixed    Hypertension    MI  (myocardial infarction) (HCC) 2000, 2004   Neuromuscular disorder (HCC)    PUD, HX OF 01/03/2009   Qualifier: Diagnosis of  By: Denyse Amass CMA, Carol     STEMI involving left circumflex coronary artery (HCC) 07/18/2018     Allergies  Allergen Reactions   Brilinta [Ticagrelor] Shortness Of Breath   Ibuprofen Nausea Only     Current Outpatient Medications  Medication Sig Dispense Refill   aspirin 81 MG EC tablet Take 1 tablet (81 mg total) by mouth every other day. 30 tablet    clopidogrel (PLAVIX) 75 MG tablet TAKE 1 TABLET BY MOUTH ONCE DAILY . APPOINTMENT REQUIRED FOR FUTURE REFILLS 90 tablet 0   ezetimibe (ZETIA) 10 MG tablet TAKE 1 TABLET BY MOUTH ONCE DAILY . APPOINTMENT REQUIRED FOR FUTURE REFILLS 90 tablet 3   gabapentin (NEURONTIN) 300 MG capsule Take 1 capsule (300 mg total) by mouth 3 (three) times daily. 40 capsule 0   isosorbide mononitrate (IMDUR) 30 MG 24 hr tablet Take 1 tablet (30 mg total) by mouth daily. 90 tablet 2   lidocaine (LIDODERM) 5 %  Place 1 patch onto the skin daily. Remove & Discard patch within 12 hours or as directed by MD 30 patch 0   nitroGLYCERIN (NITROSTAT) 0.4 MG SL tablet Place 1 tablet (0.4 mg total) under the tongue every 5 (five) minutes as needed for chest pain. 25 tablet 3   pantoprazole (PROTONIX) 40 MG tablet Take 1 tablet (40 mg total) by mouth daily. For stomach 30 tablet 11   rosuvastatin (CRESTOR) 40 MG tablet TAKE 1 TABLET BY MOUTH ONCE DAILY AT 6 PM IN THE EVENING 90 tablet 3   valACYclovir (VALTREX) 1000 MG tablet Take 1 tablet (1,000 mg total) by mouth 2 (two) times daily. 21 tablet 0   No current facility-administered medications for this visit.     Past Surgical History:  Procedure Laterality Date   CORONARY STENT INTERVENTION N/A 07/18/2018   Procedure: CORONARY STENT INTERVENTION;  Surgeon: Swaziland, Peter M, MD;  Location[facility] INVASIVE CV LAB;  Service: Cardiovascular;  Laterality: N/A;   CORONARY STENT INTERVENTION N/A 07/20/2018    Procedure: CORONARY STENT INTERVENTION;  Surgeon: Swaziland, Peter M, MD;  Location: [facility] INVASIVE CV LAB;  Service: Cardiovascular;  Laterality: N/A;   CORONARY STENT PLACEMENT  2004, 2007   5 in 2004, 1 in 2007   CORONARY/GRAFT ACUTE MI REVASCULARIZATION N/A 07/18/2018   Procedure: Coronary/Graft Acute MI Revascularization;  Surgeon: Swaziland, Peter M, MD;  Location: [facility] INVASIVE CV LAB;  Service: Cardiovascular;  Laterality: N/A;   LEFT HEART CATH AND CORONARY ANGIOGRAPHY N/A 07/18/2018   Procedure: LEFT HEART CATH AND CORONARY ANGIOGRAPHY;  Surgeon: Swaziland, Peter M, MD;  Location: [facility] INVASIVE CV LAB;  Service: Cardiovascular;  Laterality: N/A;     Allergies  Allergen Reactions   Brilinta [Ticagrelor] Shortness Of Breath   Ibuprofen Nausea Only      Family History  Problem Relation Age of Onset   Heart attack Mother    Heart attack Father    Coronary artery disease Other    Cancer Daughter      Social History Mr. Hamad reports that he quit smoking about 47 years ago. His smoking use included cigarettes. He started smoking about 67 years ago. He has a 60 pack-year smoking history. He has never used smokeless tobacco. Mr. Lemelle reports no history of alcohol use.   [name]   Physical Examination Today's Vitals   07/05/23 1433  BP: 120/80  Pulse: 81  SpO2: 95%  Weight: 215 lb (97.5 kg)  Height: 6\' 2"  (1.88 m)   Body mass index is 27.6 kg/m.  Gen: resting comfortably, no acute distress HEENT: no scleral icterus, pupils equal round and reactive, no palptable cervical adenopathy,  CV: RRR, no mrg, no jvd Resp: Clear to auscultation bilaterally GI: abdomen is soft, non-tender, non-distended, normal bowel sounds, no hepatosplenomegaly MSK: extremities are warm, no edema.  Skin: warm, no rash Neuro:  no focal deficits Psych: appropriate affect   Diagnostic Studies Cath 2007 Central aortic pressure is 101/68 with a mean of 84. LV pressure is 94/1   with an EDP of 6. There is no  aortic stenosis.   Left main was normal.   LAD was a large vessel coursing to the apex. There was very prominent   severe ectasia in the proximal portion with aneurysmal dilatation and   swirling flow. However, no high grade stenosis. In the mid LAD, there was   a diffuse 40-50% segmental plaquing. In the distal LAD, there was a tubular   70-80% stenosis. There also were  two diagonals. The first diagonal is a   medium vessel with a long 90% stenosis. The second diagonal was small with   branching vessel with a 70-80% proximal lesion.   The left circumflex was a moderate sized system. It gave off a large   branching OM-1 and a small OM-2. There is a 40% proximal lesion in the   circumflex. The lower Lachlan Mckim of the OM was totally occluded and the distal   part of the vessel filled through collaterals with TIMI I flow. There is a   40% blockage in the upper portion of the OM.   The right coronary artery was a large dominant vessel. It gave off a   moderate sized PDA and two small PLs. There was evidence of previously   placed stent from the ostial portion down to the PDA. There was 40% ostial   stenosis with damping of the 6-French catheter. This did not change with   sublingual nitroglycerin. There is a 30% lesion in the proximal to mid   portion.   Left ventriculogram done the RAO position showed an EF of 70% with mild   mitral valve prolapse. There was no wall motion abnormalities and there was   no mitral regurgitation. Left ventriculogram done in the LAO position   showed an EF of 70% with no regional wall motion abnormalities.   Abdominal aortogram showed patent renal arteries bilaterally with no   abdominal aortic aneurysm.   A left subclavian done for possible bypass surgery showed a patent LIMA to   the chest wall with no obstructions in the subclavian.   ASSESSMENT:   1. Three vessel native coronary artery disease.   2. The LAD has diffuse ectasia and swelling flow proximally as  described   above. There is a borderline lesion in the distal LAD and a high grade   disease in the diagonal, but these do not appear to be the culprit   lesion.   3. Totally occluded OM1 which appears to be the culprit.   4. Patent right coronary stents.   5. Normal LV function with mild mitral valve prolapse but no significant   mitral regurgitation.   CONCLUSION: Successful PCI of a totally occluded circumflex marginal vessel   with improvement in narrowing from 100% to 0% and improvement in flow from   TIMI 0 to TIMI III flow using a bare metal stent.   07/2018 echo IMPRESSIONS      1. The left ventricle has low normal systolic function, with an ejection fraction of 50-55%. The cavity size was normal. Left ventricular diastolic Doppler parameters are indeterminate.  2. Mild hypokinesis of the left ventricular, mid-apical inferoseptal wall, inferior segment and inferolateral wall.  3. The right ventricle has normal systolic function. The cavity was normal. There is no increase in right ventricular wall thickness.  4. The aortic valve is tricuspid Mild thickening of the aortic valve Mild calcification of the aortic valve. Aortic valve regurgitation was not assessed by color flow Doppler.     07/2018 cath Previously placed Mid RCA to Dist RCA stent (unknown type) is widely patent. Ost RCA to Mid RCA lesion is 30% stenosed. Mid RCA lesion is 95% stenosed. Ost 1st Mrg to 1st Mrg lesion is 100% stenosed. Ost LAD to Prox LAD lesion is 60% stenosed. Prox LAD lesion is 25% stenosed. Ost 1st Diag to 1st Diag lesion is 95% stenosed. Mid LAD to Dist LAD lesion is 99% stenosed. Post intervention, there is  a 0% residual stenosis. A drug-eluting stent was successfully placed using a STENT SYNERGY DES 3X24. LV end diastolic pressure is moderately elevated.   1. 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 Diffuse 99% stenosis in the mid to distal LAD. 95%  bifurcating first diagonal.    - 100% occlusion of the large first OM at site of prior stent. This is the culprit lesion   - 95% mid RCA in stent 2. Moderately elevated LVEDP 3. Successful PCI of the OM 1 with DES x1   Discussed with Dr. Allyson Sabal. There are no options for treating the LAD. It is diffusely diseased and there is no target for CABG. There are some right to left collaterals to the distal LAD. We proceeded with emergent PCI of the OM1. Recommend DAPT indefinitely given multiple layers of stent. Plan to bring patient back in 2 days for PCI of the RCA. Will treat the LAD medically. Check LV function by Echo. Maximize medical therapy. Stress importance of medication compliance.      07/2018 staged PCI Mid RCA lesion is 95% stenosed. A drug-eluting stent was successfully placed using a STENT SYNERGY DES 3.5X16. Post intervention, there is a 0% residual stenosis.   1. Successful PCI of the mid RCA with DES x 1.   Plan: continue DAPT indefinitely. Anticipate DC home in am.          Assessment and Plan   1. CAD - he is committed to indefinite DAPT as reported above -  Off beta blocker due to bradycardia.  - no recent cardiac chest pain, continue current meds   2. Chronic diastolic HF -no symptoms, continue current meds   3. HTN - at goal, continue current meds   4. Hyperlipidemia - above goal LDL of <55, he is on crestor 19m and zetia 10mg  daily - refer to lipid clinic  F/u 6 months       Antoine Poche, M.D.

## 2023-07-05 NOTE — Telephone Encounter (Signed)
 Copied from CRM [id number]. Topic: Clinical - Red Word Triage >> Jul 05, 2023  3:56 PM Albin Felling L wrote: Red Word that prompted transfer to Nurse Triage: Pt requesting Rx to help patient sleep, pt has pain due to shingles. On chest and back.  Chief Complaint: Shingles Symptoms: Pain, difficulty sleeping Frequency: 2 weeks Pertinent Negatives: Patient denies fever Disposition: [] ED /[] Urgent Care (no appt availability in office) / [x] Appointment(In office/virtual)/ []  [facility]/ [] Home Care/ [] Refused Recommended Disposition /[] [facility]/ []  Follow-up with PCP Additional Notes: Spoke to patient's wife on behalf of her husband who is experiencing severe pain from a shingles rash. Wife stated the rash is present on the patient's back and chest. Patient was seen in the office on 06/26/23. Per appointment note, patient was instructed to call back if symptoms worsen or persist. Wife stated that the patient has been in severe pain and has had difficulty sleeping since the office visit. Patient is seeking pain management. Patient is still taking valacyclovir and gabapentin. This RN advised patient to see a provider within 24 hours, per protocol. No availability with PCP. This RN scheduled patient for tomorrow morning with an alternate provider in office. This RN advised patient to call back if symptoms worsen.   Reason for Disposition  SEVERE pain (e.g., excruciating)  Answer Assessment - Initial Assessment Questions 1. APPEARANCE of RASH: "Describe the rash."      States rash is red and sores are present 2. LOCATION: "Where is the rash located?"      Chest and back 3. ONSET: "When did the rash start?"      A couple of weeks 4. ITCHING: "Does the rash itch?" If Yes, ask: "How bad is the itch?"  (Scale 1-10; or mild, moderate, severe)     Denies itching 5. PAIN: "Does the rash hurt?" If Yes, ask: "How bad is the pain?"  (Scale 0-10; or none, mild, moderate, severe)    - NONE (0):  no pain    - MILD (1-3): doesn't interfere with normal activities     - MODERATE (4-7): interferes with normal activities or awakens from sleep     - SEVERE (8-10): excruciating pain, unable to do any normal activities     Wife states her husband's pain is about a 7 or 8, states pain is keeping patient from sleeping 6. OTHER SYMPTOMS: "Do you have any other symptoms?" (e.g., fever)     Denies fever  Protocols used: Shingles (Zoster)-A-AH

## 2023-07-05 NOTE — Patient Instructions (Addendum)
 Medication Instructions:  Your physician recommends that you continue on your current medications as directed. Please refer to the Current Medication list given to you today.   Labwork: None  Testing/Procedures: None  Follow-Up: Your physician recommends that you schedule a follow-up appointment in: 6 months  Any Other Special Instructions Will Be Listed Below (If Applicable). Referral placed to Lipid Clinic  Thank you for choosing [facility]!     If you need a refill on your cardiac medications before your next appointment, please call your pharmacy.

## 2023-07-06 ENCOUNTER — Ambulatory Visit (INDEPENDENT_AMBULATORY_CARE_PROVIDER_SITE_OTHER): Payer: Medicare HMO | Admitting: Nurse Practitioner

## 2023-07-06 ENCOUNTER — Encounter: Payer: Self-pay | Admitting: Nurse Practitioner

## 2023-07-06 VITALS — BP 137/85 | HR 67 | Temp 98.0°F | Ht 74.0 in | Wt 219.4 lb

## 2023-07-06 DIAGNOSIS — B027 Disseminated zoster: Secondary | ICD-10-CM | POA: Diagnosis not present

## 2023-07-06 MED ORDER — CALAMINE EX LOTN: 1.0000 | TOPICAL_LOTION | CUTANEOUS | 0 refills | Status: AC | PRN

## 2023-07-06 MED ORDER — LIDOCAINE 4 % EX CREA
1.0000 | TOPICAL_CREAM | CUTANEOUS | 0 refills | Status: DC | PRN
Start: 2023-07-06 — End: 2023-10-03

## 2023-07-06 MED ORDER — GABAPENTIN 300 MG PO CAPS: 300.0000 mg | ORAL_CAPSULE | Freq: Three times a day (TID) | ORAL | 0 refills | Status: DC

## 2023-07-06 MED ORDER — PREDNISONE 10 MG (21) PO TBPK: ORAL_TABLET | ORAL | 0 refills | Status: DC

## 2023-07-06 NOTE — Telephone Encounter (Signed)
 Can use OTC Tylenol PM. Otherwise will need to be seen.

## 2023-07-06 NOTE — Progress Notes (Signed)
 Acute Office Visit  Subjective:     Patient ID: [name], male    DOB: Oct 17, 1940, 83 y.o.   MRN: [id number]  Chief Complaint  Patient presents with   Herpes Zoster    Has shingles on chest and back, having severe pain 9/10 unable to sleep because of pain    HPI [name] is a 83 yrs old male presents with his wife concerns for shingles.   The patient is an 83 year old male who presents today for an acute visit due to persistent, severe pain associated with a rash. He was seen on 06/26/2023 by Benjamin Stain, FNP, and was prescribed Valtrex (an antiviral) and Neurontin (gabapentin for nerve pain) for the management of his shingles. Despite these interventions, the patient reports that his pain has not improved and is currently at a severity of 10/10. He states that the pain is continuous, and he has been unable to sleep for the past 2 days due to the discomfort. Presents with Rash on Chest, and back The patient's wife notes that he is wearing 2-3 shirts while at home, likely to avoid direct contact with the affected area due to heightened sensitivity and pain. The patient denies any other new symptoms at this time but expresses significant distress from the severity of his pain and inability to rest. The situation seems to be greatly impacting his overall well-being. Active Ambulatory Problems    Diagnosis Date Noted   Essential hypertension, benign 01/03/2009   ASCVD (arteriosclerotic cardiovascular disease) 05/22/2014   Hyperlipidemia with target LDL less than 70 05/22/2014   Mild cognitive impairment 10/04/2017   Chronic combined systolic and diastolic congestive heart failure (HCC) 02/24/2020   Heartburn 08/25/2022   Disseminated herpes zoster 07/06/2023   Resolved Ambulatory Problems    Diagnosis Date Noted   Hyperlipemia 01/03/2009   CAD, NATIVE VESSEL 12/14/2009   BRADYCARDIA 01/07/2009   PUD, HX OF 01/03/2009   Essential hypertension 05/22/2014   STEMI involving  left circumflex coronary artery (HCC) 07/18/2018   Acute renal failure superimposed on stage 3 chronic kidney disease (HCC) 07/19/2018   Acute combined systolic and diastolic heart failure (HCC) 07/19/2018   Past Medical History:  Diagnosis Date   Bradycardia    CAD in native artery    Foot drop, right foot    History of GI bleed    History of mitral valve prolapse    History of peptic ulcer disease    Hyperlipidemia, mixed    Hypertension    MI (myocardial infarction) (HCC) 2000, 2004   Neuromuscular disorder (HCC)     Review of Systems  Constitutional:  Negative for chills and fever.  Respiratory:  Negative for cough and shortness of breath.   Cardiovascular:  Negative for chest pain and leg swelling.  Gastrointestinal:  Negative for nausea and vomiting.  Musculoskeletal:  Positive for myalgias.  Skin:  Positive for rash.       Chest, back  Neurological:  Negative for dizziness and headaches.  Psychiatric/Behavioral:  The patient has insomnia.        Due to pain from rash   Negative unless indicated in HPI    Objective:    BP 137/85   Pulse 67   Temp 98 F (36.7 C) (Temporal)   Ht 6\' 2"  (1.88 m)   Wt 219 lb 6.4 oz (99.5 kg)   SpO2 97%   BMI 28.17 kg/m  BP Readings from Last 3 Encounters:  07/06/23 137/85  07/05/23 120/80  06/26/23 (!) 162/75   Wt Readings from Last 3 Encounters:  07/06/23 219 lb 6.4 oz (99.5 kg)  07/05/23 215 lb (97.5 kg)  06/26/23 214 lb (97.1 kg)      Physical Exam Vitals and nursing note reviewed.  Constitutional:      Appearance: He is overweight. He is not ill-appearing.  HENT:     Head: Normocephalic and atraumatic.     Nose: Nose normal.  Eyes:     General: No scleral icterus.    Extraocular Movements: Extraocular movements intact.     Conjunctiva/sclera: Conjunctivae normal.     Pupils: Pupils are equal, round, and reactive to light.  Cardiovascular:     Heart sounds: Normal heart sounds.  Pulmonary:     Effort: Pulmonary  effort is normal.     Breath sounds: Normal breath sounds.  Skin:    Findings: Erythema and rash present. Rash is crusting and vesicular.     Comments: On left chest wrap around back  Neurological:     Mental Status: He is alert.     No results found for any visits on 07/06/23.      Assessment & Plan:  Disseminated herpes zoster -     predniSONE; Use as directed on back of pill pack  Dispense: 21 tablet; Refill: 0 -     Gabapentin; Take 1 capsule (300 mg total) by mouth 3 (three) times daily.  Dispense: 40 capsule; Refill: 0 -     Lidocaine; Apply 1 Application topically as needed.  Dispense: 30 g; Refill: 0 -     SM Calamine; Apply 1 Application topically as needed for itching.  Dispense: 177 mL; Refill: 0  Herpes zoster without complication   [name] is a 83 yrs old Caucasian male seen today for Herpes Zoster Rash Zoster Rash: Lidocaine cream 4% ; Neurontin 300 mg TID , Prednisone 10 mg #21 he is to follow instruction on the box, Calamine Lotion.  Applying cool, damp compresses to the affected area can help reduce pain and swelling - Avoid scratching - Shingles Vaccine: After an episode of shingles, getting the Shingrix vaccine (recommended for adults over 50) can help reduce the risk of future outbreaks and complications like postherpetic neuralgia   The above assessment and management plan was discussed with the patient. The patient verbalized understanding of and has agreed to the management plan. Patient is aware to call the clinic if they develop any new symptoms or if symptoms persist or worsen. Patient is aware when to return to the clinic for a follow-up visit. Patient educated on when it is appropriate to go to the emergency department.  Return if symptoms worsen or fail to improve.  Arrie Aran Santa Lighter, [facility] [address] [place], [place] [address] [phone]  Note: This document was prepared by Reubin Milan voice dictation  technology and any errors that results from this process are unintentional.

## 2023-07-22 ENCOUNTER — Other Ambulatory Visit: Payer: Self-pay | Admitting: Cardiology

## 2023-08-31 ENCOUNTER — Other Ambulatory Visit: Payer: Self-pay | Admitting: Cardiology

## 2023-08-31 ENCOUNTER — Other Ambulatory Visit: Payer: Self-pay | Admitting: Family Medicine

## 2023-10-03 ENCOUNTER — Ambulatory Visit (INDEPENDENT_AMBULATORY_CARE_PROVIDER_SITE_OTHER): Payer: Medicare HMO | Admitting: Family Medicine

## 2023-10-03 ENCOUNTER — Ambulatory Visit: Payer: Self-pay | Admitting: Family Medicine

## 2023-10-03 ENCOUNTER — Encounter: Payer: Self-pay | Admitting: Family Medicine

## 2023-10-03 VITALS — BP 132/79 | HR 64 | Temp 97.2°F | Ht 74.0 in | Wt 216.0 lb

## 2023-10-03 DIAGNOSIS — Z0001 Encounter for general adult medical examination with abnormal findings: Secondary | ICD-10-CM | POA: Diagnosis not present

## 2023-10-03 DIAGNOSIS — N401 Enlarged prostate with lower urinary tract symptoms: Secondary | ICD-10-CM | POA: Diagnosis not present

## 2023-10-03 DIAGNOSIS — Z0000 Encounter for general adult medical examination without abnormal findings: Secondary | ICD-10-CM

## 2023-10-03 DIAGNOSIS — I10 Essential (primary) hypertension: Secondary | ICD-10-CM

## 2023-10-03 DIAGNOSIS — B0229 Other postherpetic nervous system involvement: Secondary | ICD-10-CM | POA: Diagnosis not present

## 2023-10-03 DIAGNOSIS — Z9181 History of falling: Secondary | ICD-10-CM | POA: Diagnosis not present

## 2023-10-03 DIAGNOSIS — E559 Vitamin D deficiency, unspecified: Secondary | ICD-10-CM | POA: Diagnosis not present

## 2023-10-03 DIAGNOSIS — E785 Hyperlipidemia, unspecified: Secondary | ICD-10-CM

## 2023-10-03 DIAGNOSIS — I2510 Atherosclerotic heart disease of native coronary artery without angina pectoris: Secondary | ICD-10-CM | POA: Diagnosis not present

## 2023-10-03 DIAGNOSIS — R350 Frequency of micturition: Secondary | ICD-10-CM | POA: Diagnosis not present

## 2023-10-03 LAB — URINALYSIS
Bilirubin, UA: NEGATIVE
Glucose, UA: NEGATIVE
Ketones, UA: NEGATIVE
Leukocytes,UA: NEGATIVE
Nitrite, UA: NEGATIVE
Protein,UA: NEGATIVE
RBC, UA: NEGATIVE
Specific Gravity, UA: 1.015 (ref 1.005–1.030)
Urobilinogen, Ur: 0.2 mg/dL (ref 0.2–1.0)
pH, UA: 7 (ref 5.0–7.5)

## 2023-10-03 LAB — BAYER DCA HB A1C WAIVED: HB A1C (BAYER DCA - WAIVED): 5.7 % — ABNORMAL HIGH (ref 4.8–5.6)

## 2023-10-03 MED ORDER — GABAPENTIN 300 MG PO CAPS: ORAL_CAPSULE | ORAL | 0 refills | Status: DC

## 2023-10-03 MED ORDER — PANTOPRAZOLE SODIUM 40 MG PO TBEC
40.0000 mg | DELAYED_RELEASE_TABLET | Freq: Every day | ORAL | 0 refills | Status: DC
Start: 2023-10-03 — End: 2023-11-06

## 2023-10-03 MED ORDER — FEXOFENADINE HCL 180 MG PO TABS: 180.0000 mg | ORAL_TABLET | Freq: Every day | ORAL | 11 refills | Status: AC

## 2023-10-03 NOTE — Patient Instructions (Signed)
 For hearing, try Jacob Lawing at [facility] in [place]

## 2023-10-03 NOTE — Progress Notes (Signed)
 Subjective:  Patient ID: [name], male    DOB: 17-Jul-1940  Age: 83 y.o. MRN: [id number]  CC: Annual Exam   HPI [name] presents for CpE.  Having hearing problem. Aides not helping.      10/03/2023    8:05 AM 06/26/2023   10:33 AM 04/03/2023    9:40 AM  Depression screen PHQ 2/9  Decreased Interest 0 0 0  Down, Depressed, Hopeless 0 0 0  PHQ - 2 Score 0 0 0    History Sender has a past medical history of Acute combined systolic and diastolic heart failure (HCC) (08/15/8117), Bradycardia, CAD in native artery, Foot drop, right foot, History of GI bleed, History of mitral valve prolapse, History of peptic ulcer disease, Hyperlipidemia, mixed, Hypertension, MI (myocardial infarction) (HCC) (2000, 2004), Neuromuscular disorder (HCC), PUD, HX OF (01/03/2009), and STEMI involving left circumflex coronary artery (HCC) (07/18/2018).   He has a past surgical history that includes Coronary stent placement (2004, 2007); LEFT HEART CATH AND CORONARY ANGIOGRAPHY (N/A, 07/18/2018); Coronary/Graft Acute MI Revascularization (N/A, 07/18/2018); CORONARY STENT INTERVENTION (N/A, 07/18/2018); and CORONARY STENT INTERVENTION (N/A, 07/20/2018).   His family history includes Cancer in his daughter; Coronary artery disease in an other family member; Heart attack in his father and mother.He reports that he quit smoking about 47 years ago. His smoking use included cigarettes. He started smoking about 67 years ago. He has a 60 pack-year smoking history. He has never used smokeless tobacco. He reports that he does not drink alcohol and does not use drugs.    ROS Review of Systems  Constitutional:  Negative for activity change, fatigue and unexpected weight change.  HENT:  Positive for congestion, hearing loss and sneezing. Negative for ear pain, postnasal drip and trouble swallowing.   Eyes:  Negative for pain and visual disturbance.  Respiratory:  Negative for cough, chest tightness and shortness of  breath.   Cardiovascular:  Negative for chest pain, palpitations and leg swelling.  Gastrointestinal:  Negative for abdominal distention, abdominal pain, blood in stool, constipation, diarrhea, nausea and vomiting.  Endocrine: Negative for cold intolerance, heat intolerance and polydipsia.  Genitourinary:  Negative for difficulty urinating, dysuria, flank pain, frequency and urgency.  Musculoskeletal:  Negative for arthralgias and joint swelling.  Skin:  Positive for rash (residual at left back aand chest in a band. Left from shingles. Flat read, hyperemic without erythema). Negative for color change and wound.  Neurological:  Negative for dizziness, syncope, speech difficulty, weakness, light-headedness, numbness and headaches.  Hematological:  Does not bruise/bleed easily.  Psychiatric/Behavioral:  Negative for confusion, decreased concentration, dysphoric mood and sleep disturbance. The patient is not nervous/anxious.     Objective:  BP 132/79   Pulse 64   Temp (!) 97.2 F (36.2 C) (Temporal)   Ht 6\' 2"  (1.88 m)   Wt 216 lb (98 kg)   SpO2 96%   BMI 27.73 kg/m   BP Readings from Last 3 Encounters:  10/03/23 132/79  07/06/23 137/85  07/05/23 120/80    Wt Readings from Last 3 Encounters:  10/03/23 216 lb (98 kg)  07/06/23 219 lb 6.4 oz (99.5 kg)  07/05/23 215 lb (97.5 kg)     Physical Exam Constitutional:      Appearance: He is well-developed.  HENT:     Head: Normocephalic and atraumatic.  Eyes:     Pupils: Pupils are equal, round, and reactive to light.  Neck:     Thyroid : No thyromegaly.  Trachea: No tracheal deviation.  Cardiovascular:     Rate and Rhythm: Normal rate and regular rhythm.     Heart sounds: Normal heart sounds. No murmur heard.    No friction rub. No gallop.  Pulmonary:     Breath sounds: Normal breath sounds. No wheezing or rales.  Abdominal:     General: Bowel sounds are normal. There is no distension.     Palpations: Abdomen is soft.  There is no mass.     Tenderness: There is no abdominal tenderness.     Hernia: There is no hernia in the left inguinal area.  Genitourinary:    Penis: Normal.      Testes: Normal.  Musculoskeletal:        General: Normal range of motion.     Cervical back: Normal range of motion.  Lymphadenopathy:     Cervical: No cervical adenopathy.  Skin:    General: Skin is warm and dry.  Neurological:     Mental Status: He is alert and oriented to person, place, and time.      Assessment & Plan:  Well adult exam -     CBC with Differential/Platelet -     CMP14+EGFR  At moderate risk for fall -     Bayer DCA Hb A1c Waived -     CBC with Differential/Platelet -     CMP14+EGFR  Hyperlipidemia with target LDL less than 70 -     CBC with Differential/Platelet -     CMP14+EGFR -     Lipid panel  ASCVD (arteriosclerotic cardiovascular disease) -     CBC with Differential/Platelet -     CMP14+EGFR  Essential hypertension, benign -     CBC with Differential/Platelet -     CMP14+EGFR -     Urinalysis  Post herpetic neuralgia -     CBC with Differential/Platelet -     CMP14+EGFR  Vitamin D  deficiency -     CBC with Differential/Platelet -     CMP14+EGFR -     VITAMIN D  25 Hydroxy (Vit-D Deficiency, Fractures)  Benign prostatic hyperplasia with urinary frequency -     PSA, total and free  Other orders -     Pantoprazole  Sodium; Take 1 tablet (40 mg total) by mouth daily.  Dispense: 90 tablet; Refill: 0 -     Gabapentin ; 1 at bedtime for1 week then 2  The next  week then 3 the next week then 4 daily.  Dispense: 120 capsule; Refill: 0 -     Fexofenadine HCl; Take 1 tablet (180 mg total) by mouth daily. For allergy symptoms  Dispense: 30 tablet; Refill: 11     Follow-up: Return in about 1 month (around 11/03/2023).  Roise Cleaver, M.D.

## 2023-10-04 ENCOUNTER — Telehealth: Payer: Self-pay

## 2023-10-04 LAB — PSA, TOTAL AND FREE
PSA, Free Pct: 63.6 %
PSA, Free: 0.89 ng/mL
Prostate Specific Ag, Serum: 1.4 ng/mL (ref 0.0–4.0)

## 2023-10-04 LAB — CMP14+EGFR
ALT: 17 IU/L (ref 0–44)
AST: 26 IU/L (ref 0–40)
Albumin: 4.4 g/dL (ref 3.7–4.7)
Alkaline Phosphatase: 125 IU/L — ABNORMAL HIGH (ref 44–121)
BUN/Creatinine Ratio: 12 (ref 10–24)
BUN: 15 mg/dL (ref 8–27)
Bilirubin Total: 1.2 mg/dL (ref 0.0–1.2)
CO2: 23 mmol/L (ref 20–29)
Calcium: 9.6 mg/dL (ref 8.6–10.2)
Chloride: 104 mmol/L (ref 96–106)
Creatinine, Ser: 1.3 mg/dL — ABNORMAL HIGH (ref 0.76–1.27)
Globulin, Total: 2.5 g/dL (ref 1.5–4.5)
Glucose: 108 mg/dL — ABNORMAL HIGH (ref 70–99)
Potassium: 4.2 mmol/L (ref 3.5–5.2)
Sodium: 141 mmol/L (ref 134–144)
Total Protein: 6.9 g/dL (ref 6.0–8.5)
eGFR: 55 mL/min/{1.73_m2} — ABNORMAL LOW (ref >59)

## 2023-10-04 LAB — CBC WITH DIFFERENTIAL/PLATELET
Basophils Absolute: 0.1 10*3/uL (ref 0.0–0.2)
Basos: 1 %
EOS (ABSOLUTE): 0.2 10*3/uL (ref 0.0–0.4)
Eos: 3 %
Hematocrit: 48.1 % (ref 37.5–51.0)
Hemoglobin: 15.6 g/dL (ref 13.0–17.7)
Immature Grans (Abs): 0 10*3/uL (ref 0.0–0.1)
Immature Granulocytes: 0 %
Lymphocytes Absolute: 1.9 10*3/uL (ref 0.7–3.1)
Lymphs: 30 %
MCH: 29.6 pg (ref 26.6–33.0)
MCHC: 32.4 g/dL (ref 31.5–35.7)
MCV: 91 fL (ref 79–97)
Monocytes Absolute: 0.5 10*3/uL (ref 0.1–0.9)
Monocytes: 8 %
Neutrophils Absolute: 3.7 10*3/uL (ref 1.4–7.0)
Neutrophils: 58 %
Platelets: 220 10*3/uL (ref 150–450)
RBC: 5.27 x10E6/uL (ref 4.14–5.80)
RDW: 13.1 % (ref 11.6–15.4)
WBC: 6.4 10*3/uL (ref 3.4–10.8)

## 2023-10-04 LAB — LIPID PANEL
Chol/HDL Ratio: 2.8 ratio (ref 0.0–5.0)
Cholesterol, Total: 162 mg/dL (ref 100–199)
HDL: 57 mg/dL (ref >39)
LDL Chol Calc (NIH): 81 mg/dL (ref 0–99)
Triglycerides: 137 mg/dL (ref 0–149)
VLDL Cholesterol Cal: 24 mg/dL (ref 5–40)

## 2023-10-04 LAB — VITAMIN D 25 HYDROXY (VIT D DEFICIENCY, FRACTURES): Vit D, 25-Hydroxy: 29 ng/mL — ABNORMAL LOW (ref 30.0–100.0)

## 2023-10-04 NOTE — Telephone Encounter (Signed)
 Per pt request AVS Mailed

## 2023-10-04 NOTE — Telephone Encounter (Signed)
 Copied from CRM [id number]. Topic: General - Other >> Oct 04, 2023 12:38 PM Carlatta H wrote: Reason for CRM: Patient would like a visit summary mail to his home//from visit 5/27

## 2023-10-06 ENCOUNTER — Other Ambulatory Visit: Payer: Self-pay

## 2023-10-06 MED ORDER — VITAMIN D (ERGOCALCIFEROL) 1.25 MG (50000 UNIT) PO CAPS: 50000.0000 [IU] | ORAL_CAPSULE | ORAL | 0 refills | Status: DC

## 2023-10-06 NOTE — Progress Notes (Signed)
 Dear Preston Brood, Your Vitamin D  is  low. You need a prescription strength supplement I will send that in for you. Nurse, if at all possible, could you send in a prescription for the patient for vitamin D  50,000 units, 1 p.o. weekly #13 with 3 refills? Many thanks, WS

## 2023-10-09 ENCOUNTER — Other Ambulatory Visit: Payer: Self-pay | Admitting: Cardiology

## 2023-10-23 ENCOUNTER — Ambulatory Visit (INDEPENDENT_AMBULATORY_CARE_PROVIDER_SITE_OTHER): Payer: Medicare HMO

## 2023-10-23 VITALS — BP 132/79 | HR 64 | Ht 74.0 in | Wt 216.0 lb

## 2023-10-23 DIAGNOSIS — Z0000 Encounter for general adult medical examination without abnormal findings: Secondary | ICD-10-CM

## 2023-10-23 NOTE — Progress Notes (Signed)
 Subjective:   [name] is a 83 y.o. who presents for a Medicare Wellness preventive visit.  As a reminder, Annual Wellness Visits don't include a physical exam, and some assessments may be limited, especially if this visit is performed virtually. We may recommend an in-person follow-up visit with your provider if needed.  Visit Complete: Virtual I connected with  Preston Brood on 10/23/23 by a audio enabled telemedicine application and verified that I am speaking with the correct person using two identifiers.  Patient Location: Home  Provider Location: Home Office  I discussed the limitations of evaluation and management by telemedicine. The patient expressed understanding and agreed to proceed.  Vital Signs: Because this visit was a virtual/telehealth visit, some criteria may be missing or patient reported. Any vitals not documented were not able to be obtained and vitals that have been documented are patient reported.  VideoDeclined- This patient declined Librarian, academic. Therefore the visit was completed with audio only.  Persons Participating in Visit: Patient.  AWV Questionnaire: No: Patient Medicare AWV questionnaire was not completed prior to this visit.  Cardiac Risk Factors include: advanced age (>58men, >16 women);dyslipidemia;hypertension;male gender     Objective:    Today's Vitals   10/23/23 1313  BP: 132/79  Pulse: 64  Weight: 216 lb (98 kg)  Height: 6' 2 (1.88 m)   Body mass index is 27.73 kg/m.     10/23/2023    1:25 PM 10/21/2022    9:22 AM 10/06/2021    2:08 PM 08/25/2020    1:33 PM 02/14/2020    2:12 PM 07/24/2019   12:00 PM 09/03/2018    1:02 PM  Advanced Directives  Does Patient Have a Medical Advance Directive? No No No No No No No  Would patient like information on creating a medical advance directive?  No - Patient declined No - Patient declined No - Patient declined No - Patient declined No - Patient declined No  - Patient declined      Data saved with a previous flowsheet row definition    Current Medications (verified) Outpatient Encounter Medications as of 10/23/2023  Medication Sig   aspirin  81 MG EC tablet Take 1 tablet (81 mg total) by mouth every other day.   calamine lotion Apply 1 Application topically as needed for itching.   clopidogrel  (PLAVIX ) 75 MG tablet TAKE 1 TABLET BY MOUTH ONCE DAILY. APPOINTMENT REQUIRED FOR FUTURE REFILLS   ezetimibe  (ZETIA ) 10 MG tablet TAKE 1 TABLET BY MOUTH ONCE DAILY . APPOINTMENT REQUIRED FOR FUTURE REFILLS   fexofenadine  (ALLEGRA ) 180 MG tablet Take 1 tablet (180 mg total) by mouth daily. For allergy symptoms   gabapentin  (NEURONTIN ) 300 MG capsule 1 at bedtime for1 week then 2  The next  week then 3 the next week then 4 daily.   isosorbide  mononitrate (IMDUR ) 30 MG 24 hr tablet Take 1 tablet by mouth once daily   mupirocin ointment (BACTROBAN) 2 % Apply 1 Application topically 3 (three) times daily.   nitroGLYCERIN  (NITROSTAT ) 0.4 MG SL tablet Place 1 tablet (0.4 mg total) under the tongue every 5 (five) minutes as needed for chest pain.   pantoprazole  (PROTONIX ) 40 MG tablet Take 1 tablet (40 mg total) by mouth daily.   rosuvastatin  (CRESTOR ) 40 MG tablet TAKE 1 TABLET BY MOUTH ONCE DAILY AT 6 PM IN THE EVENING   Vitamin D , Ergocalciferol , (DRISDOL ) 1.25 MG (50000 UNIT) CAPS capsule Take 1 capsule (50,000 Units total) by mouth  every 7 (seven) days.   No facility-administered encounter medications on file as of 10/23/2023.    Allergies (verified) Brilinta  [ticagrelor ] and Ibuprofen   History: Past Medical History:  Diagnosis Date   Acute combined systolic and diastolic heart failure (HCC) 07/19/2018   Bradycardia    CAD in native artery    Foot drop, right foot    History of GI bleed    History of mitral valve prolapse    History of peptic ulcer disease    Hyperlipidemia, mixed    Hypertension    MI (myocardial infarction) (HCC) 2000, 2004    Neuromuscular disorder (HCC)    PUD, HX OF 01/03/2009   Qualifier: Diagnosis of  By: Andrena Bang CMA, Carol     STEMI involving left circumflex coronary artery (HCC) 07/18/2018   Past Surgical History:  Procedure Laterality Date   CORONARY STENT INTERVENTION N/A 07/18/2018   Procedure: CORONARY STENT INTERVENTION;  Surgeon: Swaziland, Peter M, MD;  Location: [facility] INVASIVE CV LAB;  Service: Cardiovascular;  Laterality: N/A;   CORONARY STENT INTERVENTION N/A 07/20/2018   Procedure: CORONARY STENT INTERVENTION;  Surgeon: Swaziland, Peter M, MD;  Location: [facility] INVASIVE CV LAB;  Service: Cardiovascular;  Laterality: N/A;   CORONARY STENT PLACEMENT  2004, 2007   5 in 2004, 1 in 2007   CORONARY/GRAFT ACUTE MI REVASCULARIZATION N/A 07/18/2018   Procedure: Coronary/Graft Acute MI Revascularization;  Surgeon: Swaziland, Peter M, MD;  Location: [facility] INVASIVE CV LAB;  Service: Cardiovascular;  Laterality: N/A;   LEFT HEART CATH AND CORONARY ANGIOGRAPHY N/A 07/18/2018   Procedure: LEFT HEART CATH AND CORONARY ANGIOGRAPHY;  Surgeon: Swaziland, Peter M, MD;  Location: [facility] INVASIVE CV LAB;  Service: Cardiovascular;  Laterality: N/A;   Family History  Problem Relation Age of Onset   Heart attack Mother    Heart attack Father    Coronary artery disease Other    Cancer Daughter    Social History   Socioeconomic History   Marital status: Married    Spouse name: Mary   Number of children: 2   Years of education: 9   Highest education level: GED or equivalent  Occupational History   Occupation: Retired in 2006  Tobacco Use   Smoking status: Former    Current packs/day: 0.00    Average packs/day: 3.0 packs/day for 20.0 years (60.0 ttl pk-yrs)    Types: Cigarettes    Start date: 05/09/1956    Quit date: 05/09/1976    Years since quitting: 47.4   Smokeless tobacco: Never  Vaping Use   Vaping status: Never Used  Substance and Sexual Activity   Alcohol use: No   Drug use: No   Sexual activity: Not Currently    Partners:  Female  Other Topics Concern   Not on file  Social History Narrative   Married and lives at home with his wife.   He has two children.    He is retired but stays active at home.   Right handed   Social Drivers of Health   Financial Resource Strain: Low Risk  (10/23/2023)   Overall Financial Resource Strain (CARDIA)    Difficulty of Paying Living Expenses: Not hard at all  Food Insecurity: No Food Insecurity (10/23/2023)   Hunger Vital Sign    Worried About Running Out of Food in the Last Year: Never true    Ran Out of Food in the Last Year: Never true  Transportation Needs: No Transportation Needs (10/23/2023)   PRAPARE - Transportation  Lack of Transportation (Medical): No    Lack of Transportation (Non-Medical): No  Physical Activity: Inactive (10/23/2023)   Exercise Vital Sign    Days of Exercise per Week: 0 days    Minutes of Exercise per Session: Not on file  Stress: No Stress Concern Present (10/23/2023)   [employer] of Occupational Health - Occupational Stress Questionnaire    Feeling of Stress: Not at all  Social Connections: Moderately Integrated (10/23/2023)   Social Connection and Isolation Panel    Frequency of Communication with Friends and Family: More than three times a week    Frequency of Social Gatherings with Friends and Family: More than three times a week    Attends Religious Services: More than 4 times per year    Active Member of [employer] or Organizations: No    Attends Engineer, structural: Never    Marital Status: Married    Tobacco Counseling Counseling given: Yes    Clinical Intake:  Pre-visit preparation completed: Yes  Pain : No/denies pain     BMI - recorded: 27.73 Nutritional Status: BMI 25 -29 Overweight Nutritional Risks: None Diabetes: No  Lab Results  Component Value Date   HGBA1C 5.7 (H) 10/03/2023     How often do you need to have someone help you when you read instructions, pamphlets, or other written  materials from your doctor or pharmacy?: 1 - Never  Interpreter Needed?: No  Information entered by :: Alia t/cma   Activities of Daily Living     10/23/2023    1:21 PM  In your present state of health, do you have any difficulty performing the following activities:  Hearing? 1  Vision? 0  Difficulty concentrating or making decisions? 0  Walking or climbing stairs? 1  Dressing or bathing? 0  Doing errands, shopping? 1  Comment pts wife  Quarry manager and eating ? N  Using the Toilet? N  In the past six months, have you accidently leaked urine? N  Do you have problems with loss of bowel control? N  Managing your Medications? N  Managing your Finances? N  Housekeeping or managing your Housekeeping? N    Patient Care Team: Roise Cleaver, MD as PCP - General (Family Medicine) Amanda Jungling Joyceann No, MD as PCP - Cardiology (Cardiology) Jhonny Moss, MD as Consulting Physician (Neurology)  I have updated your Care Teams any recent Medical Services you may have received from other providers in the past year.     Assessment:   This is a routine wellness examination for [place].  Hearing/Vision screen Hearing Screening - Comments:: Pt can't out l-ear/some loss in r-ear Vision Screening - Comments:: Pt can't see up close/pt has not gone to get his eyes check in 40 years. Will send eye referral per agreed   Goals Addressed   None    Depression Screen     10/23/2023    1:29 PM 10/03/2023    8:05 AM 06/26/2023   10:33 AM 04/03/2023    9:40 AM 10/21/2022    9:21 AM 08/25/2022    8:29 AM 02/23/2022    9:11 AM  PHQ 2/9 Scores  PHQ - 2 Score 0 0 0 0 0 0 0  PHQ- 9 Score 0          Fall Risk     10/23/2023    1:15 PM 10/03/2023    8:05 AM 04/03/2023    9:52 AM 04/03/2023    9:40 AM 10/21/2022    9:19  AM  Fall Risk   Falls in the past year? 1 1 1  0 0  Number falls in past yr: 1 1 1   0  Injury with Fall? 0 0 0  0  Risk for fall due to : Impaired balance/gait;Impaired  mobility History of fall(s) History of fall(s);Impaired balance/gait  No Fall Risks  Follow up Falls evaluation completed Education provided Falls evaluation completed  Falls prevention discussed    MEDICARE RISK AT HOME:  Medicare Risk at Home Any stairs in or around the home?: No If so, are there any without handrails?: No Home free of loose throw rugs in walkways, pet beds, electrical cords, etc?: Yes Adequate lighting in your home to reduce risk of falls?: Yes Life alert?: No Use of a cane, walker or w/c?: Yes Grab bars in the bathroom?: Yes Shower chair or bench in shower?: No Elevated toilet seat or a handicapped toilet?: No  TIMED UP AND GO:  Was the test performed?  no  Cognitive Function: 6CIT completed    07/18/2017    9:25 AM 02/24/2016    9:12 AM  MMSE - Mini Mental State Exam  Orientation to time 5 5   Orientation to Place 5 5   Registration 3 3   Attention/ Calculation 5 5   Recall 2 3   Language- name 2 objects 2 2   Language- repeat 1 1  Language- follow 3 step command 3 3   Language- read & follow direction 1 1   Write a sentence 1 1   Copy design 0 1   Total score 28 30      Data saved with a previous flowsheet row definition      12/07/2018    3:00 PM 05/11/2018   10:00 AM 10/04/2017    9:00 AM  Montreal Cognitive Assessment   Visuospatial/ Executive (0/5)  5 4  Naming (0/3)  3 3  Attention: Read list of digits (0/2) 2 2 2   Attention: Read list of letters (0/1) 1 1 1   Attention: Serial 7 subtraction starting at 100 (0/3) 3 1 3   Language: Repeat phrase (0/2) 2 2 2   Language : Fluency (0/1) 0 0 0  Abstraction (0/2) 2 2 2   Delayed Recall (0/5) 3 5 5   Orientation (0/6) 4 6 6   Total  27 28      10/23/2023    1:30 PM 10/21/2022    9:22 AM 10/06/2021    2:13 PM 08/25/2020    1:33 PM 07/24/2019   12:05 PM  6CIT Screen  What Year? 0 points 0 points  0 points 0 points  What month? 0 points 0 points  0 points 0 points  What time? 0 points 0 points 0  points 0 points 0 points  Count back from 20 0 points 0 points 0 points 0 points 0 points  Months in reverse 0 points 0 points 2 points 0 points 0 points  Repeat phrase 0 points 0 points 0 points 2 points 0 points  Total Score 0 points 0 points  2 points 0 points    Immunizations Immunization History  Administered Date(s) Administered   Influenza, High Dose Seasonal PF 02/09/2017, 01/24/2019   Influenza-Unspecified 01/23/2020   Pneumococcal Conjugate-13 07/23/2014   Pneumococcal Polysaccharide-23 07/30/2011   Tdap 02/24/2020    Screening Tests Health Maintenance  Topic Date Due   Zoster Vaccines- Shingrix (1 of 2) 01/03/2024 (Originally 08/27/1991)   INFLUENZA VACCINE  12/08/2023   Medicare Annual Wellness (  AWV)  10/22/2024   DTaP/Tdap/Td (2 - Td or Tdap) 02/23/2030   Pneumococcal Vaccine: 50+ Years  Completed   HPV VACCINES  Aged Out   Meningococcal B Vaccine  Aged Out   COVID-19 Vaccine  Discontinued   Hepatitis C Screening  Discontinued    Health Maintenance  There are no preventive care reminders to display for this patient.  Health Maintenance Items Addressed: See Nurse Notes at the end of this note  Additional Screening:  Vision Screening: Recommended annual ophthalmology exams for early detection of glaucoma and other disorders of the eye. Would you like a referral to an eye doctor? No    Dental Screening: Recommended annual dental exams for proper oral hygiene  Community Resource Referral / Chronic Care Management: CRR required this visit?  No   CCM required this visit?  No   Plan:    I have personally reviewed and noted the following in the patient's chart:   Medical and social history Use of alcohol, tobacco or illicit drugs  Current medications and supplements including opioid prescriptions. Patient is currently taking opioid prescriptions. Information provided to patient regarding non-opioid alternatives. Patient advised to discuss non-opioid  treatment plan with their provider. Functional ability and status Nutritional status Physical activity Advanced directives List of other physicians Hospitalizations, surgeries, and ER visits in previous 12 months Vitals Screenings to include cognitive, depression, and falls Referrals and appointments  In addition, I have reviewed and discussed with patient certain preventive protocols, quality metrics, and best practice recommendations. A written personalized care plan for preventive services as well as general preventive health recommendations were provided to patient.   Michaelle Adolphus, CMA   10/23/2023   After Visit Summary: (Declined) Due to this being a telephonic visit, with patients personalized plan was offered to patient but patient Declined AVS at this time   Notes: Nothing significant to report at this time.

## 2023-10-23 NOTE — Patient Instructions (Signed)
 [name] , Thank you for taking time out of your busy schedule to complete your Annual Wellness Visit with me. I enjoyed our conversation and look forward to speaking with you again next year. I, as well as your care team,  appreciate your ongoing commitment to your health goals. Please review the following plan we discussed and let me know if I can assist you in the future. Your Game plan/ To Do List    Follow up Visits: Next Medicare AWV with our clinical staff: 10/24/14 at 10:10p.m.   Next Office Visit with your provider: 11/06/23 at 8:25a.m.  Clinician Recommendations:  Aim for 30 minutes of exercise or brisk walking, 6-8 glasses of water, and 5 servings of fruits and vegetables each day.       This is a list of the screening recommended for you and due dates:  Health Maintenance  Topic Date Due   Zoster (Shingles) Vaccine (1 of 2) 01/03/2024*   Flu Shot  12/08/2023   Medicare Annual Wellness Visit  10/22/2024   DTaP/Tdap/Td vaccine (2 - Td or Tdap) 02/23/2030   Pneumococcal Vaccine for age over 101  Completed   HPV Vaccine  Aged Out   Meningitis B Vaccine  Aged Out   COVID-19 Vaccine  Discontinued   Hepatitis C Screening  Discontinued  *Topic was postponed. The date shown is not the original due date.    Advanced directives: (Declined) Advance directive discussed with you today. Even though you declined this today, please call our office should you change your mind, and we can give you the proper paperwork for you to fill out. Advance Care Planning is important because it:  [x]  Makes sure you receive the medical care that is consistent with your values, goals, and preferences  [x]  It provides guidance to your family and loved ones and reduces their decisional burden about whether or not they are making the right decisions based on your wishes.  Follow the link provided in your after visit summary or read over the paperwork we have mailed to you to help you started getting your  Advance Directives in place. If you need assistance in completing these, please reach out to us  so that we can help you!  See attachments for Preventive Care and Fall Prevention Tips.

## 2023-11-06 ENCOUNTER — Encounter: Payer: Self-pay | Admitting: Family Medicine

## 2023-11-06 ENCOUNTER — Ambulatory Visit (INDEPENDENT_AMBULATORY_CARE_PROVIDER_SITE_OTHER): Admitting: Family Medicine

## 2023-11-06 VITALS — BP 132/75 | HR 69 | Temp 98.0°F | Ht 74.0 in | Wt 212.0 lb

## 2023-11-06 DIAGNOSIS — I10 Essential (primary) hypertension: Secondary | ICD-10-CM

## 2023-11-06 DIAGNOSIS — B0229 Other postherpetic nervous system involvement: Secondary | ICD-10-CM | POA: Diagnosis not present

## 2023-11-06 DIAGNOSIS — F5101 Primary insomnia: Secondary | ICD-10-CM | POA: Diagnosis not present

## 2023-11-06 DIAGNOSIS — R12 Heartburn: Secondary | ICD-10-CM

## 2023-11-06 MED ORDER — GABAPENTIN 400 MG PO CAPS: ORAL_CAPSULE | ORAL | 1 refills | Status: DC

## 2023-11-06 MED ORDER — PANTOPRAZOLE SODIUM 40 MG PO TBEC
40.0000 mg | DELAYED_RELEASE_TABLET | Freq: Every day | ORAL | 0 refills | Status: DC
Start: 2023-11-06 — End: 2024-03-14

## 2023-11-06 MED ORDER — TRAZODONE HCL 50 MG PO TABS: 50.0000 mg | ORAL_TABLET | Freq: Every day | ORAL | 1 refills | Status: DC

## 2023-11-06 NOTE — Progress Notes (Signed)
 Subjective:  Patient ID: BURNETT SPRAY, male    DOB: Dec 04, 1940  Age: 83 y.o. MRN: [id number]  CC: Medical Management of Chronic Issues (Still having some trouble sleeping. Waking up in the middle of the night a lot but is able to go back to sleep. )   HPI [name] presents for sleep disrupted and feeling unsteady, falling a lot since starting the gabapentin . Still tender at areas of PHN. Not sleeping well. It is happening infrequently.  He is getting good relief from his postherpetic neuralgia with the gabapentin .  He is just a bit groggy in the mornings.  He is taking the trazodone  as needed.  Patient in for follow-up of GERD. Currently asymptomatic taking  PPI daily. There is no chest pain or heartburn. No hematemesis and no melena. No dysphagia or choking. Onset is remote. Progression is stable. Complicating factors, none.      11/06/2023    8:34 AM 10/23/2023    1:29 PM 10/03/2023    8:05 AM  Depression screen PHQ 2/9  Decreased Interest 0 0 0  Down, Depressed, Hopeless 0 0 0  PHQ - 2 Score 0 0 0  Altered sleeping 2 0   Tired, decreased energy 2 0   Change in appetite 0 0   Feeling bad or failure about yourself  0 0   Trouble concentrating 0 0   Moving slowly or fidgety/restless 0 0   Suicidal thoughts 0 0   PHQ-9 Score 4 0   Difficult doing work/chores Not difficult at all Not difficult at all     History [place] has a past medical history of Acute combined systolic and diastolic heart failure (HCC) (6/87/7979), Bradycardia, CAD in native artery, Foot drop, right foot, History of GI bleed, History of mitral valve prolapse, History of peptic ulcer disease, Hyperlipidemia, mixed, Hypertension, MI (myocardial infarction) (HCC) (2000, 2004), Neuromuscular disorder (HCC), PUD, HX OF (01/03/2009), and STEMI involving left circumflex coronary artery (HCC) (07/18/2018).   He has a past surgical history that includes Coronary stent placement (2004, 2007); LEFT HEART CATH AND CORONARY  ANGIOGRAPHY (N/A, 07/18/2018); Coronary/Graft Acute MI Revascularization (N/A, 07/18/2018); CORONARY STENT INTERVENTION (N/A, 07/18/2018); and CORONARY STENT INTERVENTION (N/A, 07/20/2018).   His family history includes Cancer in his daughter; Coronary artery disease in an other family member; Heart attack in his father and mother.He reports that he quit smoking about 47 years ago. His smoking use included cigarettes. He started smoking about 67 years ago. He has a 60 pack-year smoking history. He has never used smokeless tobacco. He reports that he does not drink alcohol and does not use drugs.    ROS Review of Systems  Constitutional:  Negative for fever.  Respiratory:  Negative for shortness of breath.   Cardiovascular:  Negative for chest pain.  Musculoskeletal:  Negative for arthralgias.  Skin:  Negative for rash.  Neurological:  Positive for dizziness. Negative for tremors and syncope.  Psychiatric/Behavioral:  Positive for sleep disturbance.     Objective:  BP 132/75   Pulse 69   Temp 98 F (36.7 C)   Ht 6' 2 (1.88 m)   Wt 212 lb (96.2 kg)   SpO2 95%   BMI 27.22 kg/m   BP Readings from Last 3 Encounters:  11/06/23 132/75  10/23/23 132/79  10/03/23 132/79    Wt Readings from Last 3 Encounters:  11/06/23 212 lb (96.2 kg)  10/23/23 216 lb (98 kg)  10/03/23 216 lb (98 kg)  Physical Exam Vitals reviewed.  Constitutional:      Appearance: He is well-developed.  HENT:     Head: Normocephalic and atraumatic.     Right Ear: External ear normal.     Left Ear: External ear normal.     Mouth/Throat:     Pharynx: No oropharyngeal exudate or posterior oropharyngeal erythema.   Eyes:     Pupils: Pupils are equal, round, and reactive to light.    Cardiovascular:     Rate and Rhythm: Normal rate and regular rhythm.     Heart sounds: No murmur heard. Pulmonary:     Effort: No respiratory distress.     Breath sounds: Normal breath sounds.   Musculoskeletal:      Cervical back: Normal range of motion and neck supple.   Skin:    General: Skin is warm and dry.   Neurological:     Mental Status: He is alert and oriented to person, place, and time.     Sensory: No sensory deficit.     Coordination: Coordination normal.      Assessment & Plan:  Essential hypertension, benign  PHN (postherpetic neuralgia) -     Gabapentin ; One in the morning and two in the evening.  Dispense: 90 capsule; Refill: 1  Primary insomnia -     traZODone  HCl; Take 1 tablet (50 mg total) by mouth at bedtime. For sleep  Dispense: 30 tablet; Refill: 1  Heartburn -     Pantoprazole  Sodium; Take 1 tablet (40 mg total) by mouth daily.  Dispense: 90 tablet; Refill: 0   Insomnia handout given  Follow-up: Return in about 1 month (around 12/06/2023).  Butler Der, M.D.

## 2023-11-06 NOTE — Patient Instructions (Addendum)
 Most important to Read the section on Sleep habits  Insomnia Insomnia is a sleep disorder that makes it difficult to fall asleep or stay asleep. Insomnia can cause fatigue, low energy, difficulty concentrating, mood swings, and poor performance at work or school. There are three different ways to classify insomnia: Difficulty falling asleep. Difficulty staying asleep. Waking up too early in the morning. Any type of insomnia can be long-term (chronic) or short-term (acute). Both are common. Short-term insomnia usually lasts for 3 months or less. Chronic insomnia occurs at least three times a week for longer than 3 months. What are the causes? Insomnia may be caused by another condition, situation, or substance, such as: Having certain mental health conditions, such as anxiety and depression. Using caffeine, alcohol, tobacco, or drugs. Having gastrointestinal conditions, such as gastroesophageal reflux disease (GERD). Having certain medical conditions. These include: Asthma. Alzheimer's disease. Stroke. Chronic pain. An overactive thyroid  gland (hyperthyroidism). Other sleep disorders, such as restless legs syndrome and sleep apnea. Menopause. Sometimes, the cause of insomnia may not be known. What increases the risk? Risk factors for insomnia include: Gender. Females are affected more often than males. Age. Insomnia is more common as people get older. Stress and certain medical and mental health conditions. Lack of exercise. Having an irregular work schedule. This may include working night shifts and traveling between different time zones. What are the signs or symptoms? If you have insomnia, the main symptom is having trouble falling asleep or having trouble staying asleep. This may lead to other symptoms, such as: Feeling tired or having low energy. Feeling nervous about going to sleep. Not feeling rested in the morning. Having trouble concentrating. Feeling irritable, anxious,  or depressed. How is this diagnosed? This condition may be diagnosed based on: Your symptoms and medical history. Your health care provider may ask about: Your sleep habits. Any medical conditions you have. Your mental health. A physical exam. How is this treated? Treatment for insomnia depends on the cause. Treatment may focus on treating an underlying condition that is causing the insomnia. Treatment may also include: Medicines to help you sleep. Counseling or therapy. Lifestyle adjustments to help you sleep better. Follow these instructions at home: Eating and drinking  Limit or avoid alcohol, caffeinated beverages, and products that contain nicotine and tobacco, especially close to bedtime. These can disrupt your sleep. Do not eat a large meal or eat spicy foods right before bedtime. This can lead to digestive discomfort that can make it hard for you to sleep. Sleep habits  Keep a sleep diary to help you and your health care provider figure out what could be causing your insomnia. Write down: When you sleep. When you wake up during the night. How well you sleep and how rested you feel the next day. Any side effects of medicines you are taking. What you eat and drink. Make your bedroom a dark, comfortable place where it is easy to fall asleep. Put up shades or blackout curtains to block light from outside. Use a white noise machine to block noise. Keep the temperature cool. Limit screen use before bedtime. This includes: Not watching TV. Not using your smartphone, tablet, or computer. Stick to a routine that includes going to bed and waking up at the same times every day and night. This can help you fall asleep faster. Consider making a quiet activity, such as reading, part of your nighttime routine. Try to avoid taking naps during the day so that you sleep better at night.  Get out of bed if you are still awake after 15 minutes of trying to sleep. Keep the lights down, but try  reading or doing a quiet activity. When you feel sleepy, go back to bed. General instructions Take over-the-counter and prescription medicines only as told by your health care provider. Exercise regularly as told by your health care provider. However, avoid exercising in the hours right before bedtime. Use relaxation techniques to manage stress. Ask your health care provider to suggest some techniques that may work well for you. These may include: Breathing exercises. Routines to release muscle tension. Visualizing peaceful scenes. Make sure that you drive carefully. Do not drive if you feel very sleepy. Keep all follow-up visits. This is important. Contact a health care provider if: You are tired throughout the day. You have trouble in your daily routine due to sleepiness. You continue to have sleep problems, or your sleep problems get worse. Get help right away if: You have thoughts about hurting yourself or someone else. Get help right away if you feel like you may hurt yourself or others, or have thoughts about taking your own life. Go to your nearest emergency room or: Call 911. Call the National Suicide Prevention Lifeline at 715 214 8598 or 988. This is open 24 hours a day. Text the Crisis Text Line at 6208665645. Summary Insomnia is a sleep disorder that makes it difficult to fall asleep or stay asleep. Insomnia can be long-term (chronic) or short-term (acute). Treatment for insomnia depends on the cause. Treatment may focus on treating an underlying condition that is causing the insomnia. Keep a sleep diary to help you and your health care provider figure out what could be causing your insomnia. This information is not intended to replace advice given to you by your health care provider. Make sure you discuss any questions you have with your health care provider. Document Revised: 04/05/2021 Document Reviewed: 04/05/2021 Elsevier Patient Education  2024 [employer].

## 2023-11-22 ENCOUNTER — Other Ambulatory Visit: Payer: Self-pay | Admitting: Cardiology

## 2023-12-13 ENCOUNTER — Encounter: Payer: Self-pay | Admitting: Family Medicine

## 2023-12-13 ENCOUNTER — Ambulatory Visit (INDEPENDENT_AMBULATORY_CARE_PROVIDER_SITE_OTHER): Admitting: Family Medicine

## 2023-12-13 VITALS — BP 125/68 | HR 56 | Temp 97.4°F | Ht 74.0 in | Wt 214.8 lb

## 2023-12-13 DIAGNOSIS — F5101 Primary insomnia: Secondary | ICD-10-CM | POA: Diagnosis not present

## 2023-12-13 DIAGNOSIS — K2100 Gastro-esophageal reflux disease with esophagitis, without bleeding: Secondary | ICD-10-CM | POA: Diagnosis not present

## 2023-12-13 DIAGNOSIS — B0229 Other postherpetic nervous system involvement: Secondary | ICD-10-CM | POA: Insufficient documentation

## 2023-12-13 DIAGNOSIS — I10 Essential (primary) hypertension: Secondary | ICD-10-CM | POA: Diagnosis not present

## 2023-12-13 NOTE — Progress Notes (Signed)
 Subjective:  Patient ID: [name], male    DOB: 25-Mar-1941  Age: 83 y.o. MRN: [id number]  CC: Follow-up   HPI [name] presents for recheck of his postherpetic neuralgia.  He is now taking the gabapentin  in the morning and 2 in the evening and has some occasional tenderness over the area of the recent shingles infection.  His nipple on the left side is somewhat tender to touch.  There is some red irruption remaining in the dermatome on the back.  This is nontender.  Patient is sleeping much better.  He takes the trazodone  as needed.  This is the majority of nights.  Sometimes he does not take it and he wakes up during the night.  However he sleeps well when he does take it.  He just does not want to get dependent on the medicine for sleep.  Patient in for follow-up of GERD. Currently asymptomatic taking  PPI daily. There is no chest pain or heartburn. No hematemesis and no melena. No dysphagia or choking. Onset is remote. Progression is stable. Complicating factors, none.    follow-up of hypertension. Patient has no history of headache chest pain or shortness of breath or recent cough. Patient also denies symptoms of TIA such as numbness weakness lateralizing. Patient checks  blood pressure at home and has not had any elevated readings recently. Patient denies side effects from his medication. States taking it regularly.      11/06/2023    8:34 AM 10/23/2023    1:29 PM 10/03/2023    8:05 AM  Depression screen PHQ 2/9  Decreased Interest 0 0 0  Down, Depressed, Hopeless 0 0 0  PHQ - 2 Score 0 0 0  Altered sleeping 2 0   Tired, decreased energy 2 0   Change in appetite 0 0   Feeling bad or failure about yourself  0 0   Trouble concentrating 0 0   Moving slowly or fidgety/restless 0 0   Suicidal thoughts 0 0   PHQ-9 Score 4 0   Difficult doing work/chores Not difficult at all Not difficult at all     History Dandra has a past medical history of Acute combined systolic and  diastolic heart failure (HCC) (6/87/7979), Bradycardia, CAD in native artery, Foot drop, right foot, History of GI bleed, History of mitral valve prolapse, History of peptic ulcer disease, Hyperlipidemia, mixed, Hypertension, MI (myocardial infarction) (HCC) (2000, 2004), Neuromuscular disorder (HCC), PUD, HX OF (01/03/2009), and STEMI involving left circumflex coronary artery (HCC) (07/18/2018).   He has a past surgical history that includes Coronary stent placement (2004, 2007); LEFT HEART CATH AND CORONARY ANGIOGRAPHY (N/A, 07/18/2018); Coronary/Graft Acute MI Revascularization (N/A, 07/18/2018); CORONARY STENT INTERVENTION (N/A, 07/18/2018); and CORONARY STENT INTERVENTION (N/A, 07/20/2018).   His family history includes Cancer in his daughter; Coronary artery disease in an other family member; Heart attack in his father and mother.He reports that he quit smoking about 47 years ago. His smoking use included cigarettes. He started smoking about 67 years ago. He has a 60 pack-year smoking history. He has never used smokeless tobacco. He reports that he does not drink alcohol and does not use drugs.    ROS Review of Systems  Constitutional:  Negative for fever.  Respiratory:  Negative for shortness of breath.   Cardiovascular:  Negative for chest pain.  Musculoskeletal:  Negative for arthralgias.  Skin:  Positive for rash (See HPI).  Psychiatric/Behavioral:  Positive for sleep disturbance.  Objective:  BP 125/68   Pulse (!) 56   Temp (!) 97.4 F (36.3 C)   Ht 6' 2 (1.88 m)   Wt 214 lb 12.8 oz (97.4 kg)   SpO2 96%   BMI 27.58 kg/m   BP Readings from Last 3 Encounters:  12/13/23 125/68  11/06/23 132/75  10/23/23 132/79    Wt Readings from Last 3 Encounters:  12/13/23 214 lb 12.8 oz (97.4 kg)  11/06/23 212 lb (96.2 kg)  10/23/23 216 lb (98 kg)     Physical Exam Vitals reviewed.  Constitutional:      Appearance: He is well-developed.  HENT:     Head: Normocephalic and  atraumatic.     Right Ear: External ear normal.     Left Ear: External ear normal.     Mouth/Throat:     Pharynx: No oropharyngeal exudate or posterior oropharyngeal erythema.  Eyes:     Pupils: Pupils are equal, round, and reactive to light.  Cardiovascular:     Rate and Rhythm: Normal rate and regular rhythm.     Heart sounds: No murmur heard. Pulmonary:     Effort: No respiratory distress.     Breath sounds: Normal breath sounds.  Musculoskeletal:     Cervical back: Normal range of motion and neck supple.  Skin:    Findings: Rash (There is residual hyperemia through the affected dermatome on the left side of the back radiating all the way around to the front there are no lesions including no crusting or vesicle formation) present.  Neurological:     Mental Status: He is alert and oriented to person, place, and time.      Assessment & Plan:  Post herpetic neuralgia  Primary insomnia  Essential hypertension, benign  Gastroesophageal reflux disease with esophagitis without hemorrhage     Follow-up: Return in about 3 months (around 03/14/2024).  Butler Der, M.D.

## 2023-12-25 ENCOUNTER — Ambulatory Visit: Attending: Cardiology | Admitting: Cardiology

## 2023-12-25 ENCOUNTER — Encounter: Payer: Self-pay | Admitting: Cardiology

## 2023-12-25 VITALS — BP 120/78 | HR 56 | Ht 74.0 in | Wt 213.8 lb

## 2023-12-25 DIAGNOSIS — E782 Mixed hyperlipidemia: Secondary | ICD-10-CM | POA: Diagnosis not present

## 2023-12-25 DIAGNOSIS — I2510 Atherosclerotic heart disease of native coronary artery without angina pectoris: Secondary | ICD-10-CM | POA: Diagnosis not present

## 2023-12-25 DIAGNOSIS — I5032 Chronic diastolic (congestive) heart failure: Secondary | ICD-10-CM | POA: Diagnosis not present

## 2023-12-25 NOTE — Patient Instructions (Signed)
 Medication Instructions:  Continue all current medications.   Labwork: none  Testing/Procedures: none  Follow-Up: 6 months   Any Other Special Instructions Will Be Listed Below (If Applicable).   If you need a refill on your cardiac medications before your next appointment, please call your pharmacy.

## 2023-12-25 NOTE — Progress Notes (Signed)
 Clinical Summary Mr. Lennartz is a 83 y.o.male seen today for follow up of the following medical problems.    1. CAD - multiple PCIs in the past - hx of MI in 2004, with stenting to RCA (ostium down to PDA). Of note had GI bleed 2 months later on DAPT and NSAIDs requiring transfusion.   - cath from 2007 with occluded LCX OM, received a BMS. RCA stents were patent at that time. LVEF 70% by LV gram at that time, no echoes in our system     - admit 07/2018 with acute posterior STEMI, taken to cath lab - 07/2018 cath showed ostal LAD 60%, mid LAD 99%, D1 95%, OM1 occluded, RCA 30%, mid RCA 95%. Received DES to OM1, then staged PCI to RCA. From notes no options on treating LAD, diffusely diseased and no target for CABG. Recs for indefinitie DAPT. LVEDP 29, limited diuresis due to uptrend in Cr.     - no chest pains, no SOB/DOE overall stable - compliant with meds   2. Chronic diastolic HF - no SOB/DOE, no LE edema.  - has prn lasix , takes just about once a month.    - occasional LE edema, has prn lasix  which controls.    3. HTN - compliant with meds   4. Hyperlipidemia   - 02/2020 TC 142 TG 87 HDL 51 LDL 74 - 08/2020 TC 861 TG 885 HL 45 LDL 72 - he is on crestor  40, zetia  10   02/2021 TC 160 TG 107 HDL 52 LDL 89 -02/2022 TC 846 TG 87 HDL 50 LDL 87 08/2022 TC 137 TG 92 HDL 56 LDL 64 - 03/2023 TC 143 TG 88 HDL 54 LDL 72 - 09/2023 TC 837 TG 862 HDL 57 LDL 81 - had referred to lipid clinic last visit but not able to travel to [place]   5. Chronic bradycardia - occasional lightheadedness with standing fast.  Past Medical History:  Diagnosis Date   Acute combined systolic and diastolic heart failure (HCC) 07/19/2018   Bradycardia    CAD in native artery    Foot drop, right foot    History of GI bleed    History of mitral valve prolapse    History of peptic ulcer disease    Hyperlipidemia, mixed    Hypertension    MI (myocardial infarction) (HCC) 2000, 2004    Neuromuscular disorder (HCC)    PUD, HX OF 01/03/2009   Qualifier: Diagnosis of  By: Wynetta CMA, Carol     STEMI involving left circumflex coronary artery (HCC) 07/18/2018     Allergies  Allergen Reactions   Brilinta  [Ticagrelor ] Shortness Of Breath   Ibuprofen Nausea Only     Current Outpatient Medications  Medication Sig Dispense Refill   aspirin  81 MG EC tablet Take 1 tablet (81 mg total) by mouth every other day. 30 tablet    atorvastatin  (LIPITOR) 40 MG tablet Take 40 mg by mouth daily.     calamine lotion Apply 1 Application topically as needed for itching. 177 mL 0   clopidogrel  (PLAVIX ) 75 MG tablet TAKE 1 TABLET BY MOUTH ONCE DAILY. APPOINTMENT REQUIRED FOR FUTURE REFILLS 90 tablet 2   ezetimibe  (ZETIA ) 10 MG tablet TAKE 1 TABLET BY MOUTH ONCE DAILY . APPOINTMENT REQUIRED FOR FUTURE REFILLS 90 tablet 3   fexofenadine  (ALLEGRA ) 180 MG tablet Take 1 tablet (180 mg total) by mouth daily. For allergy symptoms (Patient taking differently: Take 180 mg by mouth as  needed. For allergy symptoms) 30 tablet 11   gabapentin  (NEURONTIN ) 400 MG capsule One in the morning and two in the evening. 90 capsule 1   isosorbide  mononitrate (IMDUR ) 30 MG 24 hr tablet Take 1 tablet by mouth once daily 90 tablet 3   mupirocin ointment (BACTROBAN) 2 % Apply 1 Application topically 3 (three) times daily. (Patient taking differently: Apply 1 Application topically 3 (three) times daily. As needed)     nitroGLYCERIN  (NITROSTAT ) 0.4 MG SL tablet Place 1 tablet (0.4 mg total) under the tongue every 5 (five) minutes as needed for chest pain. 25 tablet 3   pantoprazole  (PROTONIX ) 40 MG tablet Take 1 tablet (40 mg total) by mouth daily. 90 tablet 0   traZODone  (DESYREL ) 50 MG tablet Take 1 tablet (50 mg total) by mouth at bedtime. For sleep 30 tablet 1   Vitamin D , Ergocalciferol , (DRISDOL ) 1.25 MG (50000 UNIT) CAPS capsule Take 1 capsule (50,000 Units total) by mouth every 7 (seven) days. 12 capsule 0   No  current facility-administered medications for this visit.     Past Surgical History:  Procedure Laterality Date   CORONARY STENT INTERVENTION N/A 07/18/2018   Procedure: CORONARY STENT INTERVENTION;  Surgeon: Swaziland, Peter M, MD;  Location: [facility] INVASIVE CV LAB;  Service: Cardiovascular;  Laterality: N/A;   CORONARY STENT INTERVENTION N/A 07/20/2018   Procedure: CORONARY STENT INTERVENTION;  Surgeon: Swaziland, Peter M, MD;  Location: [facility] INVASIVE CV LAB;  Service: Cardiovascular;  Laterality: N/A;   CORONARY STENT PLACEMENT  2004, 2007   5 in 2004, 1 in 2007   CORONARY/GRAFT ACUTE MI REVASCULARIZATION N/A 07/18/2018   Procedure: Coronary/Graft Acute MI Revascularization;  Surgeon: Swaziland, Peter M, MD;  Location: [facility] INVASIVE CV LAB;  Service: Cardiovascular;  Laterality: N/A;   LEFT HEART CATH AND CORONARY ANGIOGRAPHY N/A 07/18/2018   Procedure: LEFT HEART CATH AND CORONARY ANGIOGRAPHY;  Surgeon: Swaziland, Peter M, MD;  Location: [facility] INVASIVE CV LAB;  Service: Cardiovascular;  Laterality: N/A;     Allergies  Allergen Reactions   Brilinta  [Ticagrelor ] Shortness Of Breath   Ibuprofen Nausea Only      Family History  Problem Relation Age of Onset   Heart attack Mother    Heart attack Father    Coronary artery disease Other    Cancer Daughter      Social History Mr. Eland reports that he quit smoking about 47 years ago. His smoking use included cigarettes. He started smoking about 67 years ago. He has a 60 pack-year smoking history. He has never used smokeless tobacco. Mr. Burridge reports no history of alcohol use.    Physical Examination Vitals:   12/25/23 1543  BP: 120/78  Pulse: (!) 56  SpO2: 95%   Filed Weights   12/25/23 1543  Weight: 213 lb 12.8 oz (97 kg)    Gen: resting comfortably, no acute distress HEENT: no scleral icterus, pupils equal round and reactive, no palptable cervical adenopathy,  CV: RRR, no mrg, no jvd Resp: Clear to auscultation bilaterally GI: abdomen  is soft, non-tender, non-distended, normal bowel sounds, no hepatosplenomegaly MSK: extremities are warm, no edema.  Skin: warm, no rash Neuro:  no focal deficits Psych: appropriate affect   Diagnostic Studies   Cath 2007 Central aortic pressure is 101/68 with a mean of 84. LV pressure is 94/1   with an EDP of 6. There is no aortic stenosis.   Left main was normal.   LAD was a large vessel coursing to the  apex. There was very prominent   severe ectasia in the proximal portion with aneurysmal dilatation and   swirling flow. However, no high grade stenosis. In the mid LAD, there was   a diffuse 40-50% segmental plaquing. In the distal LAD, there was a tubular   70-80% stenosis. There also were two diagonals. The first diagonal is a   medium vessel with a long 90% stenosis. The second diagonal was small with   branching vessel with a 70-80% proximal lesion.   The left circumflex was a moderate sized system. It gave off a large   branching OM-1 and a small OM-2. There is a 40% proximal lesion in the   circumflex. The lower Johnothan Bascomb of the OM was totally occluded and the distal   part of the vessel filled through collaterals with TIMI I flow. There is a   40% blockage in the upper portion of the OM.   The right coronary artery was a large dominant vessel. It gave off a   moderate sized PDA and two small PLs. There was evidence of previously   placed stent from the ostial portion down to the PDA. There was 40% ostial   stenosis with damping of the 6-French catheter. This did not change with   sublingual nitroglycerin . There is a 30% lesion in the proximal to mid   portion.   Left ventriculogram done the RAO position showed an EF of 70% with mild   mitral valve prolapse. There was no wall motion abnormalities and there was   no mitral regurgitation. Left ventriculogram done in the LAO position   showed an EF of 70% with no regional wall motion abnormalities.   Abdominal aortogram showed  patent renal arteries bilaterally with no   abdominal aortic aneurysm.   A left subclavian done for possible bypass surgery showed a patent LIMA to   the chest wall with no obstructions in the subclavian.   ASSESSMENT:   1. Three vessel native coronary artery disease.   2. The LAD has diffuse ectasia and swelling flow proximally as described   above. There is a borderline lesion in the distal LAD and a high grade   disease in the diagonal, but these do not appear to be the culprit   lesion.   3. Totally occluded OM1 which appears to be the culprit.   4. Patent right coronary stents.   5. Normal LV function with mild mitral valve prolapse but no significant   mitral regurgitation.   CONCLUSION: Successful PCI of a totally occluded circumflex marginal vessel   with improvement in narrowing from 100% to 0% and improvement in flow from   TIMI 0 to TIMI III flow using a bare metal stent.   07/2018 echo IMPRESSIONS      1. The left ventricle has low normal systolic function, with an ejection fraction of 50-55%. The cavity size was normal. Left ventricular diastolic Doppler parameters are indeterminate.  2. Mild hypokinesis of the left ventricular, mid-apical inferoseptal wall, inferior segment and inferolateral wall.  3. The right ventricle has normal systolic function. The cavity was normal. There is no increase in right ventricular wall thickness.  4. The aortic valve is tricuspid Mild thickening of the aortic valve Mild calcification of the aortic valve. Aortic valve regurgitation was not assessed by color flow Doppler.     07/2018 cath Previously placed Mid RCA to Dist RCA stent (unknown type) is widely patent. Ost RCA to Mid RCA lesion is 30% stenosed.  Mid RCA lesion is 95% stenosed. Ost 1st Mrg to 1st Mrg lesion is 100% stenosed. Ost LAD to Prox LAD lesion is 60% stenosed. Prox LAD lesion is 25% stenosed. Ost 1st Diag to 1st Diag lesion is 95% stenosed. Mid LAD to Dist LAD lesion  is 99% stenosed. Post intervention, there is a 0% residual stenosis. A drug-eluting stent was successfully placed using a STENT SYNERGY DES 3X24. LV end diastolic pressure is moderately elevated.   1. 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 Diffuse 99% stenosis in the mid to distal LAD. 95% bifurcating first diagonal.    - 100% occlusion of the large first OM at site of prior stent. This is the culprit lesion   - 95% mid RCA in stent 2. Moderately elevated LVEDP 3. Successful PCI of the OM 1 with DES x1   Discussed with Dr. Court. There are no options for treating the LAD. It is diffusely diseased and there is no target for CABG. There are some right to left collaterals to the distal LAD. We proceeded with emergent PCI of the OM1. Recommend DAPT indefinitely given multiple layers of stent. Plan to bring patient back in 2 days for PCI of the RCA. Will treat the LAD medically. Check LV function by Echo. Maximize medical therapy. Stress importance of medication compliance.      07/2018 staged PCI Mid RCA lesion is 95% stenosed. A drug-eluting stent was successfully placed using a STENT SYNERGY DES 3.5X16. Post intervention, there is a 0% residual stenosis.   1. Successful PCI of the mid RCA with DES x 1.   Plan: continue DAPT indefinitely. Anticipate DC home in am.          Assessment and Plan   1. CAD - he is committed to indefinite DAPT as reported above -  Off beta blocker due to bradycardia.  - no symptoms, continue current meds   2. Chronic diastolic HF -euvolemic, continue prn lasix  40mg    3. HTN - at goal, continue current meds   4. Hyperlipidemia - above goal LDL of <55 - atorva 46m on his med list as opposed to crestor  40mg  which had been on previously, he is unclear which he has at home. Will call pharmacy to clarify, if crestor  40mg  will reach out to pharmD to see if can help get him started on repatha       Dorn PHEBE Ross, M.D.

## 2023-12-26 ENCOUNTER — Other Ambulatory Visit: Payer: Self-pay | Admitting: Family Medicine

## 2023-12-27 NOTE — Addendum Note (Signed)
 Addended by: LEIGH JON MATSU on: 12/27/2023 10:36 AM   Modules accepted: Orders

## 2024-01-19 ENCOUNTER — Telehealth: Payer: Self-pay | Admitting: Pharmacist

## 2024-01-19 NOTE — Telephone Encounter (Signed)
-----   Message from Lonni Bolk sent at 01/17/2024  9:21 AM EDT ----- For Maryruth ----- Message ----- From: Alvan Dorn FALCON, MD Sent: 12/29/2023  10:06 AM EDT To: Christopher Pavero, [facility]  Patient referred to lipid clinic but not able to travel to [facility], LDL above goal on crestor  40 and zetia . Would like to consider repatha for him, are you able to help?  JINNY Alvan MD

## 2024-01-19 NOTE — Telephone Encounter (Signed)
 Call to schedule apt - schedule is not open yet so fwd this request to schedulers patient want apt for Oct 16 at 2:30

## 2024-02-22 ENCOUNTER — Ambulatory Visit

## 2024-02-22 ENCOUNTER — Ambulatory Visit: Admitting: Pharmacist

## 2024-03-14 ENCOUNTER — Ambulatory Visit: Payer: Self-pay | Admitting: Family Medicine

## 2024-03-14 ENCOUNTER — Encounter: Payer: Self-pay | Admitting: Family Medicine

## 2024-03-14 VITALS — BP 120/65 | HR 66 | Temp 97.1°F | Wt 205.0 lb

## 2024-03-14 DIAGNOSIS — E785 Hyperlipidemia, unspecified: Secondary | ICD-10-CM | POA: Diagnosis not present

## 2024-03-14 DIAGNOSIS — I2510 Atherosclerotic heart disease of native coronary artery without angina pectoris: Secondary | ICD-10-CM | POA: Diagnosis not present

## 2024-03-14 DIAGNOSIS — E559 Vitamin D deficiency, unspecified: Secondary | ICD-10-CM

## 2024-03-14 DIAGNOSIS — Z23 Encounter for immunization: Secondary | ICD-10-CM | POA: Diagnosis not present

## 2024-03-14 DIAGNOSIS — R12 Heartburn: Secondary | ICD-10-CM

## 2024-03-14 DIAGNOSIS — F5101 Primary insomnia: Secondary | ICD-10-CM

## 2024-03-14 DIAGNOSIS — B0229 Other postherpetic nervous system involvement: Secondary | ICD-10-CM

## 2024-03-14 LAB — BAYER DCA HB A1C WAIVED: HB A1C (BAYER DCA - WAIVED): 5.5 % (ref 4.8–5.6)

## 2024-03-14 MED ORDER — GABAPENTIN 400 MG PO CAPS: ORAL_CAPSULE | ORAL | 1 refills | Status: DC

## 2024-03-14 MED ORDER — TRAZODONE HCL 50 MG PO TABS: 50.0000 mg | ORAL_TABLET | Freq: Every day | ORAL | 1 refills | Status: AC

## 2024-03-14 MED ORDER — PANTOPRAZOLE SODIUM 40 MG PO TBEC: 40.0000 mg | DELAYED_RELEASE_TABLET | Freq: Every day | ORAL | 0 refills | Status: DC

## 2024-03-14 MED ORDER — FAMOTIDINE 10 MG PO TABS: 10.0000 mg | ORAL_TABLET | Freq: Two times a day (BID) | ORAL | Status: AC | PRN

## 2024-03-14 NOTE — Progress Notes (Signed)
 Subjective:  Patient ID: [name], male    DOB: [birth year]  Age: 83 y.o. MRN: [id number]  CC: Medical Management of Chronic Issues   HPI  Discussed the use of AI scribe software for clinical note transcription with the patient, who gave verbal consent to proceed.  History of Present Illness [name] is an 83 year old male who presents for a routine checkup and diabetes re-evaluation.  He was previously identified as borderline diabetic in the spring, and a repeat test was conducted today. He feels well and has not experienced any significant symptoms related to diabetes.  He has a history of shingles from three months ago. The rash has mostly cleared, and he is not experiencing discomfort. He had been taking gabapentin  for pain relief but has not used it for a couple of weeks as the pain has subsided. He initially started gabapentin  in February and used three to four bottles during the course of treatment.  He continues to take atorvastatin  for cholesterol management. His vitamin D  levels were low in May, and he has been taking a prescribed dose of 50,000 IU once a week.  He experiences occasional shortness of breath during physical activity, such as walking or exercising, but it typically resolves with rest. He uses an electric wheelchair for mobility during events and has various mobility aids, including a quad cane and a walker, to prevent falls.  He experiences heartburn and reflux, which are generally well-controlled with medication. However, he notes occasional flare-ups in the evening after eating. He has a mild tremor, particularly noticeable in his head, but it is not currently bothersome enough to require treatment.  He has a history of falls, particularly when standing up quickly, and uses mobility aids as a precaution.          11/06/2023    8:34 AM 10/23/2023    1:29 PM 10/03/2023    8:05 AM  Depression screen PHQ 2/9  Decreased Interest 0 0 0  Down,  Depressed, Hopeless 0 0 0  PHQ - 2 Score 0 0 0  Altered sleeping 2 0   Tired, decreased energy 2 0   Change in appetite 0 0   Feeling bad or failure about yourself  0 0   Trouble concentrating 0 0   Moving slowly or fidgety/restless 0 0   Suicidal thoughts 0 0   PHQ-9 Score 4  0    Difficult doing work/chores Not difficult at all Not difficult at all      Data saved with a previous flowsheet row definition    History [name] has a past medical history of Acute combined systolic and diastolic heart failure (HCC) (6/87/7979), Bradycardia, CAD in native artery, Foot drop, right foot, History of GI bleed, History of mitral valve prolapse, History of peptic ulcer disease, Hyperlipidemia, mixed, Hypertension, MI (myocardial infarction) (HCC) (2000, 2004), Neuromuscular disorder (HCC), PUD, HX OF (01/03/2009), and STEMI involving left circumflex coronary artery (HCC) (07/18/2018).   He has a past surgical history that includes Coronary stent placement (2004, 2007); LEFT HEART CATH AND CORONARY ANGIOGRAPHY (N/A, 07/18/2018); Coronary/Graft Acute MI Revascularization (N/A, 07/18/2018); CORONARY STENT INTERVENTION (N/A, 07/18/2018); and CORONARY STENT INTERVENTION (N/A, 07/20/2018).   His family history includes Cancer in his daughter; Coronary artery disease in an other family member; Heart attack in his father and mother.He reports that he quit smoking about 47 years ago. His smoking use included cigarettes. He started smoking about 67 years ago. He has a 60  pack-year smoking history. He has never used smokeless tobacco. He reports that he does not drink alcohol and does not use drugs.    ROS Review of Systems  Constitutional: Negative.   HENT: Negative.    Eyes:  Negative for visual disturbance.  Respiratory:  Negative for cough and shortness of breath.   Cardiovascular:  Negative for chest pain and leg swelling.  Gastrointestinal:  Negative for abdominal pain, diarrhea, nausea and vomiting.   Genitourinary:  Negative for difficulty urinating.  Musculoskeletal:  Negative for arthralgias and myalgias.  Skin:  Negative for rash.  Neurological:  Negative for headaches.  Psychiatric/Behavioral:  Negative for sleep disturbance.     Objective:  BP 120/65   Pulse 66   Temp (!) 97.1 F (36.2 C)   Wt 205 lb (93 kg)   SpO2 97%   BMI 26.32 kg/m   BP Readings from Last 3 Encounters:  03/14/24 120/65  12/25/23 120/78  12/13/23 125/68    Wt Readings from Last 3 Encounters:  03/14/24 205 lb (93 kg)  12/25/23 213 lb 12.8 oz (97 kg)  12/13/23 214 lb 12.8 oz (97.4 kg)     Physical Exam Physical Exam GENERAL: Alert, cooperative, well developed, no acute distress. HEENT: Normocephalic, normal oropharynx, moist mucous membranes. CHEST: Clear to auscultation bilaterally, no wheezes, rhonchi, or crackles. CARDIOVASCULAR: Normal heart rate and rhythm, S1 and S2 normal without murmurs. ABDOMEN: Soft, non-tender, non-distended, without organomegaly, normal bowel sounds. EXTREMITIES: No cyanosis or edema. NEUROLOGICAL: Cranial nerves grossly intact, moves all extremities without gross motor or sensory deficit. SKIN: No significant rash or lesions.   Assessment & Plan:  Hyperlipidemia with target LDL less than 70 -     Bayer DCA Hb A1c Waived -     Comprehensive metabolic panel with GFR  ASCVD (arteriosclerotic cardiovascular disease) -     Bayer DCA Hb A1c Waived -     Comprehensive metabolic panel with GFR  Vitamin D  deficiency -     VITAMIN D  25 Hydroxy (Vit-D Deficiency, Fractures)  PHN (postherpetic neuralgia)  Heartburn -     Pantoprazole  Sodium; Take 1 tablet (40 mg total) by mouth daily.  Dispense: 90 tablet; Refill: 0  Primary insomnia -     traZODone  HCl; Take 1 tablet (50 mg total) by mouth at bedtime. For sleep  Dispense: 30 tablet; Refill: 1  Immunization due -     Varicella-zoster vaccine IM  Other orders -     Famotidine; Take 1 tablet (10 mg total)  by mouth 2 (two) times daily as needed for heartburn or indigestion.    Assessment and Plan Assessment & Plan Postherpetic neuralgia   Postherpetic neuralgia has resolved with no discomfort or pain. Gabapentin  is discontinued.  Gastroesophageal reflux disease with esophagitis   GERD symptoms are well-controlled with current medication, though occasional late evening flare-ups occur after eating. Recommended over-the-counter Pepcid AC for additional symptom control.  Hyperlipidemia   Cholesterol levels are stable and well-managed with atorvastatin  and levedy. Continue current medications. Cholesterol testing is deferred to the next annual visit.  Vitamin D  deficiency   Managed with weekly supplementation of 50,000 IU. Levels were low in May, and current supplementation is appropriate. Continue weekly vitamin D  supplementation. Vitamin D  levels checked with current blood work.  Tremor   Mild tremor present, primarily in the head, not affecting hands. No treatment needed as it is not bothersome. Continue to monitor for any changes or increased severity.  General Health Maintenance   Shingles  vaccination is due and administered, as there is no significant discomfort or rash present.       Follow-up: Return in about 6 months (around 09/11/2024).  Butler Der, M.D.

## 2024-03-15 LAB — COMPREHENSIVE METABOLIC PANEL WITH GFR
ALT: 13 IU/L (ref 0–44)
AST: 16 IU/L (ref 0–40)
Albumin: 4.1 g/dL (ref 3.7–4.7)
Alkaline Phosphatase: 104 IU/L (ref 48–129)
BUN/Creatinine Ratio: 12 (ref 10–24)
BUN: 16 mg/dL (ref 8–27)
Bilirubin Total: 1.2 mg/dL (ref 0.0–1.2)
CO2: 24 mmol/L (ref 20–29)
Calcium: 9.1 mg/dL (ref 8.6–10.2)
Chloride: 105 mmol/L (ref 96–106)
Creatinine, Ser: 1.37 mg/dL — ABNORMAL HIGH (ref 0.76–1.27)
Globulin, Total: 2.2 g/dL (ref 1.5–4.5)
Glucose: 112 mg/dL — ABNORMAL HIGH (ref 70–99)
Potassium: 3.7 mmol/L (ref 3.5–5.2)
Sodium: 141 mmol/L (ref 134–144)
Total Protein: 6.3 g/dL (ref 6.0–8.5)
eGFR: 52 mL/min/1.73 — ABNORMAL LOW (ref >59)

## 2024-03-15 LAB — VITAMIN D 25 HYDROXY (VIT D DEFICIENCY, FRACTURES): Vit D, 25-Hydroxy: 57.4 ng/mL (ref 30.0–100.0)

## 2024-03-18 NOTE — Progress Notes (Signed)
Hello [name],  Your lab result is normal and/or stable.Some minor variations that are not significant are commonly marked abnormal, but do not represent any medical problem for you.  Best regards, Kaimana Neuzil, M.D.

## 2024-05-30 ENCOUNTER — Other Ambulatory Visit: Payer: Self-pay | Admitting: Cardiology

## 2024-05-30 ENCOUNTER — Other Ambulatory Visit: Payer: Self-pay | Admitting: Family Medicine

## 2024-05-30 DIAGNOSIS — R12 Heartburn: Secondary | ICD-10-CM

## 2024-06-12 ENCOUNTER — Other Ambulatory Visit: Payer: Self-pay | Admitting: Cardiology

## 2024-09-12 ENCOUNTER — Ambulatory Visit: Admitting: Family Medicine

## 2024-10-23 ENCOUNTER — Ambulatory Visit: Payer: Self-pay

## 2024-11-05 ENCOUNTER — Ambulatory Visit
